# Patient Record
Sex: Female | Born: 1955 | Race: White | Hispanic: No | Smoking: Never smoker
Health system: Southern US, Community
[De-identification: ages and names within clinical notes are randomized; demographics above are authoritative.]

## PROBLEM LIST (undated history)

## (undated) DIAGNOSIS — F329 Major depressive disorder, single episode, unspecified: Secondary | ICD-10-CM

## (undated) DIAGNOSIS — D369 Benign neoplasm, unspecified site: Secondary | ICD-10-CM

## (undated) DIAGNOSIS — K219 Gastro-esophageal reflux disease without esophagitis: Secondary | ICD-10-CM

## (undated) DIAGNOSIS — K5790 Diverticulosis of intestine, part unspecified, without perforation or abscess without bleeding: Secondary | ICD-10-CM

## (undated) DIAGNOSIS — F32A Depression, unspecified: Secondary | ICD-10-CM

## (undated) DIAGNOSIS — M1990 Unspecified osteoarthritis, unspecified site: Secondary | ICD-10-CM

## (undated) DIAGNOSIS — G4730 Sleep apnea, unspecified: Secondary | ICD-10-CM

## (undated) DIAGNOSIS — F419 Anxiety disorder, unspecified: Secondary | ICD-10-CM

## (undated) DIAGNOSIS — E66813 Obesity, class 3: Secondary | ICD-10-CM

## (undated) DIAGNOSIS — T4145XA Adverse effect of unspecified anesthetic, initial encounter: Secondary | ICD-10-CM

## (undated) DIAGNOSIS — E079 Disorder of thyroid, unspecified: Secondary | ICD-10-CM

## (undated) DIAGNOSIS — M8580 Other specified disorders of bone density and structure, unspecified site: Secondary | ICD-10-CM

## (undated) DIAGNOSIS — K648 Other hemorrhoids: Secondary | ICD-10-CM

## (undated) HISTORY — DX: Other hemorrhoids: K64.8

## (undated) HISTORY — DX: Unspecified osteoarthritis, unspecified site: M19.90

## (undated) HISTORY — PX: JOINT REPLACEMENT: SHX530

## (undated) HISTORY — DX: Diverticulosis of intestine, part unspecified, without perforation or abscess without bleeding: K57.90

## (undated) HISTORY — DX: Depression, unspecified: F32.A

## (undated) HISTORY — PX: BREAST BIOPSY: SHX20

## (undated) HISTORY — DX: Major depressive disorder, single episode, unspecified: F32.9

## (undated) HISTORY — DX: Other specified disorders of bone density and structure, unspecified site: M85.80

## (undated) HISTORY — DX: Anxiety disorder, unspecified: F41.9

## (undated) HISTORY — DX: Disorder of thyroid, unspecified: E07.9

## (undated) HISTORY — DX: Morbid (severe) obesity due to excess calories: E66.01

## (undated) HISTORY — DX: Benign neoplasm, unspecified site: D36.9

## (undated) HISTORY — DX: Sleep apnea, unspecified: G47.30

## (undated) HISTORY — DX: Obesity, class 3: E66.813

---

## 1999-06-27 ENCOUNTER — Other Ambulatory Visit: Admission: RE | Admit: 1999-06-27 | Discharge: 1999-06-27 | Payer: Self-pay | Admitting: Obstetrics and Gynecology

## 2006-04-19 ENCOUNTER — Encounter (INDEPENDENT_AMBULATORY_CARE_PROVIDER_SITE_OTHER): Payer: Self-pay | Admitting: *Deleted

## 2006-04-19 ENCOUNTER — Inpatient Hospital Stay (HOSPITAL_COMMUNITY): Admission: EM | Admit: 2006-04-19 | Discharge: 2006-04-23 | Payer: Self-pay | Admitting: Emergency Medicine

## 2008-02-20 ENCOUNTER — Encounter: Admission: RE | Admit: 2008-02-20 | Discharge: 2008-02-20 | Payer: Self-pay | Admitting: Emergency Medicine

## 2008-06-11 ENCOUNTER — Emergency Department (HOSPITAL_BASED_OUTPATIENT_CLINIC_OR_DEPARTMENT_OTHER): Admission: EM | Admit: 2008-06-11 | Discharge: 2008-06-11 | Payer: Self-pay | Admitting: Emergency Medicine

## 2008-06-28 ENCOUNTER — Ambulatory Visit (HOSPITAL_COMMUNITY): Admission: RE | Admit: 2008-06-28 | Discharge: 2008-06-28 | Payer: Self-pay | Admitting: Family Medicine

## 2008-07-17 ENCOUNTER — Ambulatory Visit (HOSPITAL_BASED_OUTPATIENT_CLINIC_OR_DEPARTMENT_OTHER): Admission: RE | Admit: 2008-07-17 | Discharge: 2008-07-17 | Payer: Self-pay | Admitting: Orthopedic Surgery

## 2008-08-28 HISTORY — PX: TOTAL KNEE ARTHROPLASTY: SHX125

## 2008-09-25 ENCOUNTER — Encounter (INDEPENDENT_AMBULATORY_CARE_PROVIDER_SITE_OTHER): Payer: Self-pay | Admitting: Orthopedic Surgery

## 2008-09-25 ENCOUNTER — Inpatient Hospital Stay (HOSPITAL_COMMUNITY): Admission: RE | Admit: 2008-09-25 | Discharge: 2008-09-29 | Payer: Self-pay | Admitting: Orthopedic Surgery

## 2008-11-30 ENCOUNTER — Other Ambulatory Visit: Admission: RE | Admit: 2008-11-30 | Discharge: 2008-11-30 | Payer: Self-pay | Admitting: Family Medicine

## 2009-06-10 ENCOUNTER — Ambulatory Visit (HOSPITAL_COMMUNITY): Admission: RE | Admit: 2009-06-10 | Discharge: 2009-06-10 | Payer: Self-pay | Admitting: Orthopedic Surgery

## 2011-05-12 NOTE — Op Note (Signed)
NAME:  Joan Russo, Joan Russo NO.:  [id number]   MEDICAL RECORD NO.:  [id number]          PATIENT TYPE:  AMB   LOCATION:  [facility]                         FACILITY:  [facility]   PHYSICIAN:  Deidre Ala, M.D.    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   POSTOPERATIVE DIAGNOSES:  1. Significant degenerative medial lateral meniscus tears.  2. Degenerative joint disease grade 3 to 4 tricompartment.  3. Tight lateral retinaculum.  4. Large fibrous medial and lateral plicae.   PROCEDURE:  1. Left knee operative arthroscopy with partial medial and lateral      meniscectomies.  2. Abrasion ablation chondroplasties tricompartment.  3. Arthroscopic lateral retinacular release.  4. Medial and lateral plica excision.   SURGEON:  Doristine Section, M.D.   ASSISTANT:  Phineas Semen, PA-C.   ANESTHESIA:  General with LMA.   CULTURES:  None.   DRAINS:  None.   ESTIMATED BLOOD LOSS:  Minimal.   TOURNIQUET TIME:  49 minutes.   PATHOLOGIC FINDINGS AND HISTORY:  Joan Russo was sent for  consultation by Dr. Shaune Pollack for knee pain.  Longstanding history of  the knee intermittently giving way, pain increasingly worse, swelling in  the knee.  She had x-rays showing no obvious signs of degenerative  change in the knee joint.  She was injected with cortisone by Dr.  Althea Charon in my office.  She came back with severe knee pain.  MRI had  been done showing a medial meniscal posterior horn tear with meniscal  excision, severe chondromalacia patella and prepatellar bursitis.  The  patient did not have bone on bone.  She did have body mass index issues  with her height being 4 feet 11 and weight 240 pounds, so because she  was having mechanical symptoms we elected to proceed with diagnostic  and  operative arthroscopy.  At surgery, she had tricompartment DJD on a dime  sized area on the anterior medial femoral condyle, the medial joint line  femoral and tibial, the lateral joint line mostly tibial, the trochlea  grade 2 in most of the groove and about 50% of the posterior patella,  especially medially.  She had huge thick fibroadipose plicae.  She had a  very tight lateral retinaculum.  Some medial osteophyte formation.  She  had a degenerative medial meniscus tear with a portion of the posterior  horn flipped under.  This was debrided back to stable rim and posterior  meniscectomy carried out in the posterior one-third.  The lateral  meniscus did have degenerative changes from front to back that were  significant and these were debrided to a stable rim.  ACL was intact.   DESCRIPTION OF PROCEDURE:  With adequate anesthesia obtained using  endotracheal technique, 1 gram Ancef given IV prophylaxis, the patient  was placed in the supine position.  The left lower extremity was prepped  from the malleoli to the leg holder in the  standard fashion.  After  standard prepping and draping, Esmarch exsanguination was used.  The  tourniquet was let up to 400 mmHg.  Superior lateral and inflow portal  were made.  The knee was insufflated with normal saline with the  arthroscopic pump.  Medial lateral scope portals were then made and the  joint was thoroughly inspected.  I then shaved out the extremely large  thick fibroadipose plica back to the medial sidewall and lysed the  medial band.  I then exposed the medial meniscus and used basket and  shaver to saucerize it to a stable rim and removed most of the posterior  horn.  I then used a shaver and ablator on one to seal and smoothed the  tibiofemoral surfaces.  I then reversed portals, checked the lateral  meniscus, used basket, shaver and ablator to seal and shaved and ablated  the lateral compartment as above.  I then shaved out the  lateral plica.  I then shaved the lateral gutter.  I then smoothed the trochlea and the  posterior patella with the ablator and shaver.  I then observed tilt and  track and did an arthroscopic lateral retinacular release from vastus  lateralis to the joint line and further ablated and smoothed the  posterior patella.  The knee was then irrigated through the scope.  0.5%  Marcaine with morphine was injected in and about the joint.  The portals  were left open.  A bulky sterile compressive dressing was applied with  lateral foam pad for tamponade and Ezy-Wrap placed.  The patient then  having tolerated the procedure well was awakened, taken recovery room in  satisfactory condition to be discharged per outpatient routine.  Given  Percocet for pain and told to call the office for recheck tomorrow.           ______________________________  V. Charlesetta Shanks, M.D.     VEP/MEDQ  D:  07/17/2008  T:  07/17/2008  Job:  [id number]   cc:   Duncan Dull, M.D.  Fax: [phone]   Otho Darner, M.D.  Fax: [phone]

## 2011-05-12 NOTE — Op Note (Signed)
NAME:  Joan Russo, Joan Russo NO.:  [id number]   MEDICAL RECORD NO.:  [id number]          PATIENT TYPE:  INP   LOCATION:  1611                         FACILITY:  [facility]   PHYSICIAN:  Deidre Ala, M.D.    DATE OF BIRTH:  07/24/1956   DATE OF PROCEDURE:  09/25/2008  DATE OF DISCHARGE:                               OPERATIVE REPORT   PREOPERATIVE DIAGNOSIS:  End-stage degenerative joint disease, left  knee.   POSTOPERATIVE DIAGNOSIS:  End-stage degenerative joint disease, left  knee.   PROCEDURE:  Left total knee arthroplasty using cemented DePuy  components, LCS type with rotating platform with MBT stem.   SURGEON:  1. Charlesetta Shanks, M.D.   ASSISTANT:  Phineas Semen, P.A.   ANESTHESIA:  General with femoral nerve block with LMA.   CULTURES:  None.   DRAINS:  None.   BLOOD LOSS:  Less than 100 mL, replaced without.   TOURNIQUET TIME:  89 minutes.   PATHOLOGIC FINDINGS AND HISTORY:  Isa has high body mass index.  She  underwent a left knee arthroscopy July 17, 2008.  She had marked  changes.  She was debrided.  Postop she never did well.  She continued  to have pain and so she elected to proceed with total knee arthroplasty  and she became bone on bone.  In any case at surgery, she had end-stage  disease at this point. It should be noted that pre-knee scope, she was  not felt to be end stage.  In any case at total knee, we placed a medium  left femur with a #2 revision cemented tray MBT stem which measured 75 x  12, rotating platform size 10, a 32 mm all poly patella button and we  used tobramycin in the cement, gentamicin in the knee at the end of the  case.  I also had given her prophylaxis with Ancef but I did add  vancomycin at the end of the case.  She had an odd pannus in her knee  with no evidence of gross purulence.  She had had previous surgery, so  for precaution I added the vancomycin for possible greater coverage  perioperatively.  She did have  a frozen section which showed no  increasing polys but I felt it might be consistent with some sort of  biofilm low virulent organism even given those parameters and so I added  the vancomycin.  We had good fit and fill with full extension with  flexion to 95 degrees and good ligamentous stability with good patellar  tracking.   PROCEDURE:  With adequate anesthesia obtained, using LMA technique and  femoral nerve block, the patient was placed in the supine position.  The  left lower extremity was prepped from toes to the tourniquet in standard  fashion.  After standard prepping and draping, Esmarch examination was  used.  The tourniquet let up to 375 mmHg.  Median parapatellar skin  incision was followed by median parapatellar retinacular incision.  The  incision was deepened sharply with a knife and hemostasis obtained using  the Bovie electrocoagulator.  Dissection was carried down  to the  retinaculum which was opened and the patella was everted.  Fat pad  excised.  The menisci and both cruciates were removed.  With some  arduousness retractors were placed as she had large extremity obesity.  We did then place the intermedullary drill down the tibia, reamed up to  a 12, set the tibial cutting jig in place and made that cut.  We then  sized the femur to a medium, placed the intramedullary guide.  We set it  at 12.5.  It was a bit tight, so moved it up 2.5 mm anteriorly.  I made  the anterior-posterior cuts and fit the 10 in flexion.  We then placed a  4 degrees valgus distal femoral cutting jig in place, made that cut 5 mm  back, fit the 10 in full extension.  We then placed the finishing guide  on the femur, made those cuts.  We then exposed the tibia and reamed up  the proximal ream and the distal ream for the 12, then put the trial in  with a keel punch, put the 10-mm rotating platform, put on the femoral  component and articulated the knee through a range of motion.  We then   callipered the patella to a 22.  We cut it free hand down to about a 14,  placed the template for the three holes of the patella and then trialed  the patella.  All trial components were then removed while we checked  component sizing as they came on the field and thorough jet lavage of  the knee was carried out.  We then mixed cement two batches with 1.3  grams each of tobramycin.  We then cemented on the tibial component,  impacted it, removed excess cement.  We then cemented on the femoral  component, impacted it, removed excess cement with the rotating platform  put in place.  We then held the knee in full extension and removed  excess cement.  We then cemented on the patella component, impacted it  and removed excess cement and held it with a clamp until the cement had  cured.  We then irrigated.  We then let the cement cure, irrigated the  knee.  The tourniquet was let down.  Bleeding points were cauterized.  Hemovac drains were placed in the medial and lateral gutter and brought  out through the superior lateral portal.  The wound was then closed in  layers with #1 interrupted figure-of-eight Vicryls on the retinaculum  with a running locking oversew of #1 PDS, 0 and 2-0 Vicryl subcu and  skin staples.  Hemovac was hooked up to Autovac.  A bulky sterile  compressive dressing was applied and we then rubber shod the Hemovac  drain tubes after injecting gentamicin into the knee 20 mg in 40 mL of  saline and will hold that until 2 hours postop.  The dressing was  applied with knee immobilizer.  The patient having tolerated procedure  well, was awakened, taken to recovery room in satisfactory condition to  be admitted for routine postoperative care, analgesia and CPM.           ______________________________  V. Charlesetta Shanks, M.D.     VEP/MEDQ  D:  09/25/2008  T:  09/26/2008  Job:  [id number]   cc:   Duncan Dull, M.D.  Fax: [phone]   Otho Darner, M.D.  Fax: [phone]

## 2011-05-12 NOTE — Discharge Summary (Signed)
NAME:  Joan Russo, Joan Russo NO.:  [id number]   MEDICAL RECORD NO.:  [id number]          PATIENT TYPE:  INP   LOCATION:  1611                         FACILITY:  [facility]   PHYSICIAN:  Deidre Ala, M.D.    DATE OF BIRTH:  27-Mar-1956   DATE OF ADMISSION:  09/25/2008  DATE OF DISCHARGE:  09/29/2008                               DISCHARGE SUMMARY   FINAL DIAGNOSES:  1. End-stage degenerative joint disease, left knee.  2. Postoperative blood loss anemia.  3. Morbid obesity.   PROCEDURES:  September 25, 2008:  Left knee total arthroplasty.  Surgeon:  Dr. Renae Fickle.   This is a 55 year old Caucasian female followed by Dr. Renae Fickle.  She was  having a lot of left knee pain.  She underwent arthroscopy.  She failed  this and conservative medical management which did not help.  She  continued to have chronic left knee pain.  She was now ready to undergo  total knee arthroplasty for alleviation of symptoms.   HOSPITAL COURSE:  The patient was admitted to [facility] on  September 25, 2008.  At that time, she underwent a left total knee  arthroplasty.  The patient tolerated the procedure well; no  intraoperative complications occurred.  Postoperatively, the patient did  well.  She had a  dressing on and a drain on which was removed on the second postoperative  day.  She was working with Physical Therapy.  She had a CMP machine  going.  On the evening of the second postoperative day, she had got out  of bed by herself and had fallen, but she did not receive any  significant injuries.  She continued to do well after this without any  problems.  She did continue to work with Physical Therapy.  By the  fourth postoperative day, she was able to get out of bed by herself and  get up without assistance.  At this time, it was deemed she was ready to  be discharged.  She did have postop blood loss anemia at the time of  discharge.  Her hemoglobin was 8.1 and her hematocrit was 24.5 with  a  WBC of 6.4 and her platelets were 195,000.  Her incision was clean and  dry.  She had no Homans' sign, no calf pain.  Peripheral pulses were  intact, neuro was grossly intact.   At the time of discharge, the patient was to get Percocet 5/325 one or 2  p.o. q. 4-6 hours p.r.n. for pain, no refills.  She was to continue with  Lovenox 30 mg subcu q.12 hours for the next 10 days for deep vein  thrombus prophylaxis.  Then she was given Robaxin 750 mg 1 p.o. q.i.d.  p.r.n., no refills.  She was discharged home in satisfactory stable  condition on September 29, 2008.  She will follow up with Dr. Renae Fickle in 10  days.      Phineas Semen, P.A.    ______________________________  Seth Bake. Charlesetta Shanks, M.D.    CL/MEDQ  D:  09/29/2008  T:  09/29/2008  Job:  [id number]   cc:  Deidre Ala, M.D.  Fax: [phone]

## 2011-05-15 NOTE — H&P (Signed)
NAME:  Joan Russo, Joan Russo            ACCOUNT NO.:  [id number]   MEDICAL RECORD NO.:  [id number]          PATIENT TYPE:  EMS   LOCATION:  MAJO                         FACILITY:  [facility]   PHYSICIAN:  Melissa L. Ladona Ridgel, MD  DATE OF BIRTH:  1956-07-09   DATE OF ADMISSION:  04/19/2006  DATE OF DISCHARGE:                                HISTORY & PHYSICAL   CHIEF COMPLAINT:  Chest pain.   PRIMARY CARE PHYSICIAN:  Schuyler Amor, M.D.   HISTORY OF PRESENT ILLNESS:  The patient is a 55 year old white female old white female with  a past medical history for anxiety but no diabetes and no hypertension. The  patient states that last week she had pain between her shoulder blades which  she researched on the Internet and found that this may be a sign of coronary  artery disease in females. The pain sort of went away but she kept that in  mind and this week she has noticed that over the past couple of weeks she  has had increase shortness of breath with minimal activity. She states  yesterday she swept the kitchen with a broom and became very short of breath  to the point where her husband made a comment about it. The patient has  noted that she cannot do stairs very easily of shortness of breath but  denies any problems with waking up at night short of breath. The patient  states this morning she got up and was getting to go to work and getting her  grandchildren ready to go to school when she noticed that she had a stabbing  like pain in her left chest right under her left breast. The pain was worse  with deep inspiration, nothing made it better. She did take 4 regular adult  aspirins after looking on the Internet to see if this was consistent with a  myocardial infarction. She subsequently took the children to school and was  getting ready to go work and noticed that the pain just continued to  escalate and therefore she came to the emergency room for further  evaluation. She describes the pain as stabbing in  nature. It goes through  the lower chest into her back. Nothing makes it better, taking a deep breath  makes it worse. At present, it has resolved with minimal rest and the  aspirin but she still can notice the spot where it is.   REVIEW OF SYSTEMS:  She states that she is able to lay flat to sleep. She  has no orthopnea. She does stairs and becomes very short of breath and  recently has noted shortness of breath associated with fatigue. She has had  no weight loss or weight gain although she is obese. She does occasionally  get palpations when she is anxious. She denies any cough, fever, chills,  nausea, vomiting. She has had no dysuria, no abdominal pain and no lower  extremity edema. All other review of systems are negative.   PAST MEDICAL HISTORY:  Anxiety, denies hypertension or diabetes.   PAST SURGICAL HISTORY:  None.   SOCIAL HISTORY:  She denies tobacco  or ethanol or illicit drug use. She  works in Airline [employer].   FAMILY HISTORY:  Mom is living with diabetes. Dad is deceased at the age of  32 from cancer which is presumed to be lung but is unclear. She is married,  she has children and she currently is caring for 2 of her grandchildren at  home.   ALLERGIES:  No known drug allergies.   MEDICATIONS:  Xanax 1/2 of a 0.25 tablet p.r.n.   PHYSICAL EXAMINATION:  VITAL SIGNS:  Temperature 97.9, blood pressure is  142/80, pulse is 72, respirations 20, saturation 99%.  GENERAL:  This is an obese white female in no acute distress.  HEENT:  She is normocephalic, atraumatic. Pupils equal round and reactive to  light. Extraocular muscles are intact. Mucous membranes are moist.  NECK:  Supple. There is no JVD, no lymph nodes and no carotid bruits.  CHEST:  Clear to auscultation. There is no rhonchi, rales or wheezes.  CARDIOVASCULAR:  There is a regular rate and rhythm. Positive S1, S2, no S3,  S4. No murmurs, rubs or gallops.  ABDOMEN:  Obese, nontender, nondistended with positive bowel  sounds.  EXTREMITIES:  Show no cyanosis, clubbing or edema.  NEUROLOGIC:  Awake, alert, oriented. Cranial nerves II-XII are intact. Power  is 5/5. Deep tendon reflexes are 2+.   LABORATORY DATA:  Sodium is 142, potassium 3.9, chloride 109, CO2 is 41.8.  BUN is 12, creatinine 0.8, glucose is 91, her white count is 8.7. Hemoglobin  14, hematocrit 40.6 and platelets are 327. Her D-dimer is less than 22 which  is negative and point of care enzymes are negative so far.   EKG shows 72 beats per minute with no ST-T wave changes. Prior chest x-ray  shows cardiomegaly with no infiltrates and no congestive heart failure.   ASSESSMENT:  This is a 55 year old white female female with no significant past  medical history except for anxiety and obesity. She denies hypertension or  diabetes. She presents with left sided chest pain, escalating shortness of  breath with activities of daily living and pain between her shoulder bladder  last week.   1.  Cardiovascular.  Atypical chest pain with recent fatigue and dyspnea on      exertion with daily activities. Will check a 2-D echo, start her on      aspirin and Lopressor as well as Lovenox 1 mg/kg. Will obtain a      cardiology consult for possible stress testing.  2.  Pulmonary.  Dyspnea on exertion in the face of obesity with a negative D-      dimer. I do not feel compelled to do a chest CT at this time.  3.  Gastrointestinal. Will start her on a PPI.  4.  Genitourinary.  There are no complaints.  5.  Endocrine. Will check a fasting lipid panel and a hemoglobin A1c as well      as a TSH.  6.  A DVT prophylaxis will be with 1 mg/kg of Lovenox therapy to cover her      for ACS as well.      Melissa L. Ladona Ridgel, MD  Electronically Signed     MLT/MEDQ  D:  04/19/2006  T:  04/19/2006  Job:  [id number]   cc:   Schuyler Amor, M.D.  Fax: [phone]

## 2011-05-15 NOTE — Discharge Summary (Signed)
NAME:  Joan Russo, Joan Russo NO.:  [id number]   MEDICAL RECORD NO.:  [id number]          PATIENT TYPE:  INP   LOCATION:  4707                         FACILITY:  [facility]   PHYSICIAN:  Deirdre Peer. Polite, M.D. DATE OF BIRTH:  1956/01/15   DATE OF ADMISSION:  04/19/2006  DATE OF DISCHARGE:  04/23/2006                                 DISCHARGE SUMMARY   DISCHARGE DIAGNOSES:  1.  Chest pain.  Please note the patient underwent Cardiolite stress test      which showed mild reversible defect at the apex/anterior lateral wall,      ejection fraction 79%.  The patient ultimately underwent cardiac      catheterization which showed normal coronaries and normal left      ventricular function.  2.  Mild dyslipidemia.  3.  Elevated TSH at 5.9. recommend repeat testing in four to six weeks.   DISCHARGE MEDICATIONS:  The patient to resume p.r.n. Xanax.   HISTORY OF PRESENT ILLNESS:  A 55 year old female presented to the hospital  for evaluation of chest pain.  Because of the patient's complaints,  admission was deemed necessary for further evaluation and treatment.  Please  see dictated H&P for further details.   PAST MEDICAL HISTORY:  1.  Anxiety.  2.  The denies hypertension or diabetes.   PAST SURGICAL HISTORY:  None.   SOCIAL HISTORY:  Negative for tobacco.   FAMILY HISTORY:  Per admission H&P.   HOSPITAL COURSE:  The patient was admitted to telemetry floor bed for  evaluation and treatment of chest pain.  The patient was had serial cardiac  enzymes which were negative for ischemia.  The patient underwent Cardiolite  testing which showed mild reversible defect at the apex/anterior lateral  wall for EF of 79%.  Therefore, the patient underwent cardiac  catheterization.  The patient's cardiac catheterization was within normal  limits.  Therefore, the patient's chest pains were likely not cardiac, felt  similar to her anxiety disorder.  The patient did have a mildly elevated  TSH  which was recommended to be followed up for the six weeks.  The patient did  have mildly abnormal lipids with a total cholesterol of 194 and LDL of 122,  HDL 49.  Lifestyle modification was recommended especially  because the patient's has many risk factors.  Cholesterol medications were  started at this time.  Recommend further followup with primary MD in  approximately three to four months time.  The patient was discharged to home  in stable condition.      Deirdre Peer. Polite, M.D.  Electronically Signed     RDP/MEDQ  D:  06/03/2006  T:  06/03/2006  Job:  [id number]

## 2011-05-15 NOTE — Cardiovascular Report (Signed)
NAME:  Joan Russo, Joan Russo NO.:  [id number]   MEDICAL RECORD NO.:  [id number]          PATIENT TYPE:  INP   LOCATION:  4707                         FACILITY:  [facility]   PHYSICIAN:  Meade Maw, M.D.    DATE OF BIRTH:  Nov 16, 1956   DATE OF PROCEDURE:  04/23/2006  DATE OF DISCHARGE:  04/23/2006                              CARDIAC CATHETERIZATION   DATE OF PROCEDURE:  April 23, 2006.   PROCEDURE PERFORMED:  Left heart catheterization, coronary angiography,  single-plane ventriculogram.   PROCEDURE:  After obtaining written informed consent, the patient was  brought to the cardiac catheterization lab in a postabsorptive state.  Preop  sedation was achieved using Versed 2 mg IV.  The right groin was prepped and  draped in usual sterile fashion.  Local anesthesia was achieved using 1%  Xylocaine.  A 6-French hemostasis sheath was placed into the right femoral  artery using a modified Seldinger technique.  Selective coronary angiography  was performed using a JL 3.5 and a 6-French NTR catheter.  All catheter  exchanges were made over a guide wire.  There was an initial spasm of the  right coronary artery requiring intracoronary nitroglycerin.  This  demonstrated relief of the spasm.  There was no identifiable disease.  The  patient was then transferred to the holding area.  Hemostasis was achieved  using FemoStop device.  There was no immediate complications.   FINDINGS:  The aortic pressure is 110/40, LV pressure was 118/12.  EDP is  20.   SINGLE-PLANE VENTRICULOGRAM:  Single-plane ventriculogram revealed normal  wall motion, ejection fraction of 60-65%.  There was post PVC MR only.   CORONARY ANGIOGRAPHY:  Left main coronary artery bifurcates into the left  anterior descending and circumflex vessel.  There was no significant disease  noted in the left main coronary artery.   Left anterior descending:  Left anterior descending gives rise to a trivial  D1, large D2,   large D3, goes on to end as an apical branch.  There is no  disease noted in the left anterior descending or its branches.   Circumflex vessel:  Circumflex vessel is large caliber vessel, it gives rise  to trivial obtuse marginals and a large PL branch.  There is no disease  noted in the circumflex or its branches.   Right coronary artery:  Right coronary artery is dominant for the posterior  circulation, it gives rise to two RV marginals, PDA and a PL branch.  There  is no disease noted in the right coronary artery or its branches.   FINAL IMPRESSION:  Normal coronary angiography, normal single-plane  ventriculogram.   RECOMMENDATION:  Recommendation is to consider other etiologies for her  chest pain.      Meade Maw, M.D.  Electronically Signed     HP/MEDQ  D:  04/23/2006  T:  04/24/2006  Job:  [id number]   cc:   Schuyler Amor, M.D.  Fax: [phone]

## 2011-05-15 NOTE — Consult Note (Signed)
NAME:  Joan Russo, Joan Russo NO.:  [id number]   MEDICAL RECORD NO.:  [id number]          PATIENT TYPE:  OBV   LOCATION:  4707                         FACILITY:  [facility]   PHYSICIAN:  Meade Maw, M.D.    DATE OF BIRTH:  07/11/1956   DATE OF CONSULTATION:  04/20/2006  DATE OF DISCHARGE:                                   CONSULTATION   INDICATION FOR CONSULT:  Chest pain.   HISTORY:  Joan Russo is a 55 year old female.  She is the wife of another  patient of mine, Joan Russo.  She presents to the hospital with  complaints of left stabbing chest pain.  She initially noted two weeks prior  this presentation a pain in her back which waxed and waned.  The patient was  concerned that she had coronary artery disease.  Her husband also has  coronary artery disease.  She researched it on the Internet.  This added to  her concern that it may be cardiac but she did not seek attention.  She  continued over the next two weeks with increasing shortness of breath.  She  has had no orthopnea, no palpitations, no pedal edema.  On the day of  presentation, she got up, noted a stabbing pain in her left chest which  persisted.  There were no real aggravating or alleviating factors.  The  patient did take four aspirin.  There was no significant improvement.  She  subsequently took care of the children and then presented to the emergency  room for further evaluation and treatment.  There was no associated nausea,  vomiting, or diaphoresis.  Her coronary risk factors are significant for  post-menopausal status, morbid obesity.  There is no history of  hypertension, diabetes, or tobacco use.   PAST MEDICAL HISTORY:  Significant for:  1.  Anxiety disorder.  2.  Depression.   PAST SURGICAL HISTORY:  None.   SOCIAL HISTORY:  There is no history of tobacco or alcohol.  She is married.  She works in Airline [employer].  She currently is under increased stress.  She is  attempting to adopt her two  grandkids who are ages 42 and 41.   FAMILY HISTORY:  Mother has diabetes.  Father passed at the age of 39 from  cancer, unknown type.   MEDICATIONS PRIOR TO ARRIVAL:  Xanax 0.25 mg one-half tab p.r.n.   CURRENT MEDICATIONS:  Aspirin 325 mg daily, Lovenox per cardiology protocol,  Lopressor 12.5 mg p.o. q.12h., Protonix 40 mg daily, Xanax p.r.n.   REVIEW OF SYSTEMS:  As per the HPI and the patient notes that she has been  morbidly obese most of her life but has gained an additional 20 pounds since  caring for her grandchildren.  She does not have symptoms of excessive  daytime sleepiness, although she states that she suffers from chronic  insomnia.  She has had no change in her bowel or bladder habits.   PHYSICAL EXAMINATION:  VITAL SIGNS:  Systolic blood pressure ranging from  118 to 120, heart rate 75 to 80.  She is afebrile.  O2 sat 93% on room  air.  Telemetry is revealing sinus rhythm without ectopy.  HEENT:  It is difficult to evaluate secondary to body habitus.  She has good  carotid upstrokes.  There are no carotid bruits noted.  There is no neck  vein distention noted.  PULMONARY:  Reveals breath sounds which are equal and clear to auscultation.  No use of accessory muscles.  CARDIOVASCULAR:  Reveals regular rate and rhythm.  Normal S1 and S2.  PMI is  not palpable.  ABDOMEN:  Morbidly obese.  There is no unusual tenderness.  There are normal  bowel sounds.  EXTREMITIES:  Reveal distal pulses which are palpable.  There is no  peripheral edema noted.  NEUROLOGIC:  Nonfocal.   LABORATORY DATA:  Transthoracic echo was performed.  It was suboptimal  secondary to body habitus.  Overall, the patient was noted to have grossly  normal valvular morphology, grossly normal systolic function.  Her [facility]  markers were negative.  Her pH was 7.36.  Her bicarb was 24, pCO2 41.  White  count 8.4.  Hemoglobin 13.  Platelet count was 280.  INR was 1.  She had  normal electrolytes.   Creatinine 0.9.  Normal liver function tests.  Myoglobin was 63.  D-dimer was normal at less than 0.22.  Chest x-ray  revealed no active disease; did demonstrate cardiomegaly, most likely  related to technique in that the patient is morbidly obese.  Her ECG reveals  normal sinus rhythm.  There is light R-wave progression which is most likely  related to technique.  There are no ST changes.   IMPRESSION:  40.  A 55 year old female with atypical chest pain, significant concerns      regarding coronary artery disease.  Risk factors include morbid obesity      and age.  We will proceed with a stress Cardiolite for further      evaluation.  2.  Anxiety disorder.  The patient continues on Xanax.  3.  Depression.  May need to consider antidepressive therapy.  4.  Dyslipidemia.  We will obtain a fasting lipid profile for further      evaluation.      Meade Maw, M.D.  Electronically Signed     HP/MEDQ  D:  04/20/2006  T:  04/21/2006  Job:  [id number]   cc:   Schuyler Amor, M.D.  Fax: [phone]

## 2011-09-25 LAB — POCT HEMOGLOBIN-HEMACUE
Hemoglobin: 14.3
Operator id: 268271

## 2011-09-28 LAB — BASIC METABOLIC PANEL
BUN: 10
Calcium: 8.8
Chloride: 102
Creatinine, Ser: 1.15

## 2011-09-28 LAB — URINALYSIS, ROUTINE W REFLEX MICROSCOPIC
Bilirubin Urine: NEGATIVE
Glucose, UA: NEGATIVE
Hgb urine dipstick: NEGATIVE
Ketones, ur: NEGATIVE
Protein, ur: NEGATIVE
pH: 5.5

## 2011-09-28 LAB — CBC
Hemoglobin: 10.6 — ABNORMAL LOW
MCV: 88.9
Platelets: 263
RBC: 3.52 — ABNORMAL LOW
RDW: 14.6
WBC: 12.3 — ABNORMAL HIGH

## 2011-09-28 LAB — DIFFERENTIAL
Eosinophils Absolute: 0.2
Eosinophils Relative: 3
Lymphocytes Relative: 46
Neutrophils Relative %: 45

## 2011-09-28 LAB — COMPREHENSIVE METABOLIC PANEL
ALT: 25
AST: 22
Alkaline Phosphatase: 85
BUN: 12
CO2: 26
Creatinine, Ser: 0.76
GFR calc non Af Amer: >60
Glucose, Bld: 102 — ABNORMAL HIGH
Total Protein: 6.5

## 2011-09-28 LAB — PROTIME-INR: Prothrombin Time: 13

## 2011-09-28 LAB — TYPE AND SCREEN

## 2011-09-28 LAB — APTT: aPTT: 32

## 2011-09-28 LAB — URINE CULTURE

## 2011-09-28 LAB — ABO/RH: ABO/RH(D): A POS

## 2011-09-29 LAB — CBC
Hemoglobin: 9.2 — ABNORMAL LOW
MCHC: 33.2
MCHC: 33.4
MCHC: 33.5
Platelets: 186
RBC: 2.63 — ABNORMAL LOW
RBC: 2.73 — ABNORMAL LOW
RDW: 14.3
WBC: 5.5

## 2011-09-29 LAB — BASIC METABOLIC PANEL
BUN: 7
Glucose, Bld: 158 — ABNORMAL HIGH

## 2011-09-29 LAB — PREPARE RBC (CROSSMATCH)

## 2011-09-29 LAB — HEMOGLOBIN AND HEMATOCRIT, BLOOD: Hemoglobin: 8 — ABNORMAL LOW

## 2011-10-13 ENCOUNTER — Encounter: Payer: Self-pay | Admitting: Family Medicine

## 2011-10-13 ENCOUNTER — Ambulatory Visit (INDEPENDENT_AMBULATORY_CARE_PROVIDER_SITE_OTHER): Payer: PRIVATE HEALTH INSURANCE | Admitting: Family Medicine

## 2011-10-13 VITALS — BP 120/82 | HR 74 | Temp 98.4°F | Ht <= 58 in | Wt 272.2 lb

## 2011-10-13 DIAGNOSIS — F329 Major depressive disorder, single episode, unspecified: Secondary | ICD-10-CM

## 2011-10-13 DIAGNOSIS — F419 Anxiety disorder, unspecified: Secondary | ICD-10-CM

## 2011-10-13 DIAGNOSIS — F341 Dysthymic disorder: Secondary | ICD-10-CM

## 2011-10-13 DIAGNOSIS — Z6841 Body Mass Index (BMI) 40.0 and over, adult: Secondary | ICD-10-CM | POA: Insufficient documentation

## 2011-10-13 DIAGNOSIS — R5381 Other malaise: Secondary | ICD-10-CM

## 2011-10-13 DIAGNOSIS — Z23 Encounter for immunization: Secondary | ICD-10-CM

## 2011-10-13 DIAGNOSIS — M1990 Unspecified osteoarthritis, unspecified site: Secondary | ICD-10-CM

## 2011-10-13 DIAGNOSIS — E559 Vitamin D deficiency, unspecified: Secondary | ICD-10-CM

## 2011-10-13 DIAGNOSIS — F32A Depression, unspecified: Secondary | ICD-10-CM | POA: Insufficient documentation

## 2011-10-13 DIAGNOSIS — R5383 Other fatigue: Secondary | ICD-10-CM | POA: Insufficient documentation

## 2011-10-13 LAB — CBC WITH DIFFERENTIAL/PLATELET
Basophils Absolute: 0 10*3/uL (ref 0.0–0.1)
Basophils Relative: 0.5 % (ref 0.0–3.0)
Eosinophils Absolute: 0.3 10*3/uL (ref 0.0–0.7)
Lymphocytes Relative: 47.3 % — ABNORMAL HIGH (ref 12.0–46.0)
MCHC: 33.3 g/dL (ref 30.0–36.0)
Neutrophils Relative %: 43.1 % (ref 43.0–77.0)
RBC: 4.66 Mil/uL (ref 3.87–5.11)
RDW: 14.5 % (ref 11.5–14.6)

## 2011-10-13 LAB — T4, FREE: Free T4: 0.94 ng/dL (ref 0.60–1.60)

## 2011-10-13 LAB — BASIC METABOLIC PANEL
Chloride: 106 mEq/L (ref 96–112)
Potassium: 4.8 mEq/L (ref 3.5–5.1)
Sodium: 143 mEq/L (ref 135–145)

## 2011-10-13 LAB — TSH: TSH: 2.94 u[IU]/mL (ref 0.35–5.50)

## 2011-10-13 LAB — VITAMIN B12: Vitamin B-12: 561 pg/mL (ref 211–911)

## 2011-10-13 MED ORDER — DULOXETINE HCL 60 MG PO CPEP: 60.0000 mg | ORAL_CAPSULE | Freq: Every day | ORAL | Status: DC

## 2011-10-13 MED ORDER — DICLOFENAC SODIUM 75 MG PO TBEC: 75.0000 mg | DELAYED_RELEASE_TABLET | Freq: Two times a day (BID) | ORAL | Status: DC

## 2011-10-13 MED ORDER — CYCLOBENZAPRINE HCL 5 MG PO TABS: 5.0000 mg | ORAL_TABLET | Freq: Three times a day (TID) | ORAL | Status: DC | PRN

## 2011-10-13 NOTE — Progress Notes (Signed)
Subjective:    Patient ID: Joan Russo, female    DOB: [birth year], 55 y.o.   MRN: [id number]  HPI  55 yo here to establish care.  Morbid obesity- overweight her entire life. Getting progressively worse with progressive arthritis. She is now essentially housebound. Has tried every diet she can think of- weight watchers, nutrisytem, and every fad diet friends told her about.  Ready to do something about it.  Extreme fatigue- she thinks it is due to her weight but not sure.  Tired all the time, regardless of how much sleep she gets. No CP or SOB. No blurred vision.  No rectal bleeding.  Anxiety and depression- chronic issue.  Currently taking Cymbalta 60 mg daily (rather new medication for her) and as needed Alprazolam.  Constantly tearful and sad about her weight.  OA- s/p left TKR in 2009.  She feels it is time to replace the right side but cannot afford it until next year. Constant pain in lower back and bilateral knees. Denies radiculopathy.  Patient Active Problem List  Diagnoses  . Obesity, Class III, BMI 40-49.9 (morbid obesity)  . Anxiety and depression  . Vitamin D deficiency  . Fatigue   Past Medical History  Diagnosis Date  . Obesity, Class III, BMI 40-49.9 (morbid obesity)   . Anxiety and depression   . Vitamin D deficiency   . Urinary incontinence   . OA (osteoarthritis)    Past Surgical History  Procedure Date  . 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 Smoking status: Never Smoker   . Smokeless tobacco: Not on file  . Alcohol Use: Not on file   Family History  Problem Relation Age of Onset  . Cancer Mother 63    breast  . Cancer Daughter 81    breast   No Known Allergies  Current outpatient prescriptions:alendronate (FOSAMAX) 70 MG tablet, Take 70 mg by mouth every 7 (seven) days. Take with a full glass of water on an empty stomach. , Disp: , Rfl: ;  ALPRAZolam (XANAX) 0.5 MG tablet, Take 0.5 mg by mouth at  bedtime as needed.  , Disp: , Rfl: ;  diclofenac (VOLTAREN) 75 MG EC tablet, Take 1 tablet (75 mg total) by mouth 2 (two) times daily., Disp: 30 tablet, Rfl: 1 DULoxetine (CYMBALTA) 60 MG capsule, Take 1 capsule (60 mg total) by mouth daily., Disp: 30 capsule, Rfl: 1;  Ergocalciferol (VITAMIN D2 PO), Take 1 tablet by mouth once a week.  , Disp: , Rfl: ;  cyclobenzaprine (FLEXERIL) 5 MG tablet, Take 1 tablet (5 mg total) by mouth every 8 (eight) hours as needed for muscle spasms., Disp: 30 tablet, Rfl: 1   The PMH, PSH, Social History, Family History, Medications, and allergies have been reviewed in [facility], and have been updated if relevant.   Review of Systems See HPI Patient reports no  vision/ hearing changes,anorexia,  fever ,adenopathy, persistant / recurrent hoarseness, swallowing issues, chest pain, edema,persistant / recurrent cough, hemoptysis, dyspnea(rest, exertional, paroxysmal nocturnal), gastrointestinal  bleeding (melena, rectal bleeding), abdominal pain, excessive heart burn, GU symptoms(dysuria, hematuria, pyuria, voiding/incontinence  Issues) syncope,  severe memory loss, concerning skin lesions,  abnormal bruising/bleeding, major joint swelling, breast masses or abnormal vaginal bleeding.       Objective:   Physical Exam BP 120/82  Pulse 74  Temp(Src) 98.4 F (36.9 C) (Oral)  Ht 4\' 10"  (1.473 m)  Wt 272 lb 4 oz (123.492  kg)  BMI 56.90 kg/m2  General:  Morbidly obese, alert,appropriate and cooperative throughout examination Head:  normocephalic and atraumatic.   Eyes:  vision grossly intact, pupils equal, pupils round, and pupils reactive to light.   Ears:  R ear normal and L ear normal.   Nose:  no external deformity.   Mouth:  good dentition.   Neck:  No deformities, masses, or tenderness noted. Lungs:  Normal respiratory effort, chest expands symmetrically. Lungs are clear to auscultation, no crackles or wheezes. Heart:  Normal rate and regular rhythm. S1 and S2 normal  without gallop, murmur, click, rub or other extra sounds. Abdomen:  Bowel sounds positive,abdomen soft and non-tender without masses, organomegaly or hernias noted. Msk:  No deformity or scoliosis noted of thoracic or lumbar spine.   Extremities:  No clubbing, cyanosis, edema, or deformity noted with normal full range of motion of all joints, SLR neg bilaterally, pos lumbar paraspinous spasm.   Neurologic:  alert & oriented X3  Skin:  Intact without suspicious lesions or rashes Cervical Nodes:  No lymphadenopathy noted Psych:  Cognition and judgment appear intact. Alert and cooperative with normal attention span and concentration. No apparent delusions, illusions, hallucinations     Assessment & Plan:   1. Obesity, Class III, BMI 40-49.9 (morbid obesity)  Deteriorated.  After discussing different options, pt would like to find out more about gastric surgery such as lap band. Will place referral.  Ambulatory referral to General Surgery  2. Anxiety and depression   Unchanged.  Continue current meds and reassess next month at physical exam. The patient indicates understanding of these issues and agrees with the plan.  DULoxetine (CYMBALTA) 60 MG capsule  3. Vitamin D deficiency   Vitamin D (25 hydroxy)  4. Fatigue   Deteriorated.  Likely multifactorial and significantly impacted by her weight.  Will check labs today to rule out other contributing factors.   TSH, T4, free, CBC w/Diff, Basic Metabolic Panel (BMET), B12  5. OA (osteoarthritis)  Deteriorated.  She wanted to defer ortho at this time. diclofenac (VOLTAREN) 75 MG EC tablet

## 2011-10-13 NOTE — Patient Instructions (Signed)
Nice to meet you. Please stop by to see Joan Russo on your way out to set up your referral.

## 2011-10-14 ENCOUNTER — Encounter: Payer: Self-pay | Admitting: *Deleted

## 2011-11-16 ENCOUNTER — Other Ambulatory Visit: Payer: Self-pay | Admitting: Family Medicine

## 2011-11-17 NOTE — Telephone Encounter (Signed)
Rx sent in electronically by Dr. Dayton Martes.

## 2011-12-01 ENCOUNTER — Ambulatory Visit (INDEPENDENT_AMBULATORY_CARE_PROVIDER_SITE_OTHER): Payer: PRIVATE HEALTH INSURANCE | Admitting: Family Medicine

## 2011-12-01 ENCOUNTER — Encounter: Payer: Self-pay | Admitting: Family Medicine

## 2011-12-01 VITALS — BP 122/82 | HR 97 | Temp 98.5°F | Ht <= 58 in | Wt 267.8 lb

## 2011-12-01 DIAGNOSIS — R5383 Other fatigue: Secondary | ICD-10-CM

## 2011-12-01 DIAGNOSIS — F419 Anxiety disorder, unspecified: Secondary | ICD-10-CM

## 2011-12-01 DIAGNOSIS — F32A Depression, unspecified: Secondary | ICD-10-CM

## 2011-12-01 DIAGNOSIS — M2550 Pain in unspecified joint: Secondary | ICD-10-CM | POA: Insufficient documentation

## 2011-12-01 DIAGNOSIS — Z136 Encounter for screening for cardiovascular disorders: Secondary | ICD-10-CM

## 2011-12-01 DIAGNOSIS — F341 Dysthymic disorder: Secondary | ICD-10-CM

## 2011-12-01 DIAGNOSIS — Z1211 Encounter for screening for malignant neoplasm of colon: Secondary | ICD-10-CM | POA: Insufficient documentation

## 2011-12-01 DIAGNOSIS — Z0000 Encounter for general adult medical examination without abnormal findings: Secondary | ICD-10-CM

## 2011-12-01 LAB — LIPID PANEL
Cholesterol: 231 mg/dL — ABNORMAL HIGH (ref 0–200)
HDL: 60.4 mg/dL (ref >39.00)
VLDL: 22.4 mg/dL (ref 0.0–40.0)

## 2011-12-01 MED ORDER — ALENDRONATE SODIUM 70 MG PO TABS: 70.0000 mg | ORAL_TABLET | ORAL | Status: DC

## 2011-12-01 MED ORDER — ALPRAZOLAM 0.5 MG PO TABS: 0.5000 mg | ORAL_TABLET | Freq: Every evening | ORAL | Status: DC | PRN

## 2011-12-01 MED ORDER — BUSPIRONE HCL 15 MG PO TABS: 7.5000 mg | ORAL_TABLET | Freq: Two times a day (BID) | ORAL | Status: DC

## 2011-12-01 MED ORDER — CYCLOBENZAPRINE HCL 5 MG PO TABS: ORAL_TABLET | ORAL | Status: DC

## 2011-12-01 NOTE — Patient Instructions (Signed)
Great to see you. We are adding Buspar 7.5 mg twice daily to your Cymbalta 60 mg daily. Please call me in 3-4 weeks with an update of how you are feeling. Please stop by to see Shirlee Limerick on your way out to set up your colonoscopy. Have a wonderful holiday.

## 2011-12-01 NOTE — Progress Notes (Signed)
Subjective:    Patient ID: Joan Russo, female    DOB: 10-29-1956, 55 y.o.   MRN: [id number]  HPI  55 yo here for CPX.  Morbid obesity- referred for lap band consultation last month.  She went to consultation but cannot afford her cost of her surgery. This has been worsening her anxiety and depression.  Anxiety and depression- chronic issue.  Currently taking Cymbalta 60 mg daily and as needed Alprazolam.  Constantly tearful and sad about her weight. Also anxious. No SI or HI but feels the Cymbalta isn't enough although it is helping with her joint pain.  OA- s/p left TKR in 2009.  She feels it is time to replace the right side but cannot afford it until next year. Constant pain in lower back and bilateral knees. Denies radiculopathy. Flexeril helps a little. Thinks she has OA in her fingers too.  Very swollen and painful DIP and PIP in mornings.  UTD on all prevention except has never had a colonoscopy. Patient Active Problem List  Diagnoses  . Obesity, Class III, BMI 40-49.9 (morbid obesity)  . Anxiety and depression  . Vitamin D deficiency  . Fatigue  . Routine general medical examination at a health care facility   Past Medical History  Diagnosis Date  . Obesity, Class III, BMI 40-49.9 (morbid obesity)   . Anxiety and depression   . Vitamin D deficiency   . Urinary incontinence   . OA (osteoarthritis)    Past Surgical History  Procedure Date  . 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 Smoking status: Never Smoker   . Smokeless tobacco: Not on file  . Alcohol Use: Not on file   Family History  Problem Relation Age of Onset  . Cancer Mother 20    breast  . Cancer Daughter 26    breast   No Known Allergies  Current outpatient prescriptions:alendronate (FOSAMAX) 70 MG tablet, Take 70 mg by mouth every 7 (seven) days. Take with a full glass of water on an empty stomach. , Disp: , Rfl: ;  ALPRAZolam (XANAX) 0.5 MG tablet,  Take 0.5 mg by mouth at bedtime as needed.  , Disp: , Rfl: ;  cyclobenzaprine (FLEXERIL) 5 MG tablet, TAKE 1 TABLET BY MOUTH EVERY 8 HOURS AS NEEDED FOR MUSCLE SPASMS, Disp: 30 tablet, Rfl: 1 diclofenac (VOLTAREN) 75 MG EC tablet, Take 1 tablet (75 mg total) by mouth 2 (two) times daily., Disp: 30 tablet, Rfl: 1;  DULoxetine (CYMBALTA) 60 MG capsule, Take 1 capsule (60 mg total) by mouth daily., Disp: 30 capsule, Rfl: 1;  Ergocalciferol (VITAMIN D2 PO), Take 1 tablet by mouth once a week.  , Disp: , Rfl:    The PMH, PSH, Social History, Family History, Medications, and allergies have been reviewed in [facility], and have been updated if relevant.   Review of Systems See HPI Patient reports no  vision/ hearing changes,anorexia,  fever ,adenopathy, persistant / recurrent hoarseness, swallowing issues, chest pain, edema,persistant / recurrent cough, hemoptysis, dyspnea(rest, exertional, paroxysmal nocturnal), gastrointestinal  bleeding (melena, rectal bleeding), abdominal pain, excessive heart burn, GU symptoms(dysuria, hematuria, pyuria, voiding/incontinence  Issues) syncope,  severe memory loss, concerning skin lesions,  abnormal bruising/bleeding, major joint swelling, breast masses or abnormal vaginal bleeding.       Objective:   Physical Exam BP 122/82  Pulse 97  Temp(Src) 98.5 F (36.9 C) (Oral)  Ht 4\' 10"  (1.473 m)  Wt 267 lb  12 oz (121.451 kg)  BMI 55.96 kg/m2 Wt Readings from Last 3 Encounters:  12/01/11 267 lb 12 oz (121.451 kg)  10/13/11 272 lb 4 oz (123.492 kg)     General:  Morbidly obese, alert,appropriate and cooperative throughout examination Head:  normocephalic and atraumatic.   Eyes:  vision grossly intact, pupils equal, pupils round, and pupils reactive to light.   Ears:  R ear normal and L ear normal.   Nose:  no external deformity.   Mouth:  good dentition.   Neck:  No deformities, masses, or tenderness noted. Lungs:  Normal respiratory effort, chest expands  symmetrically. Lungs are clear to auscultation, no crackles or wheezes. Heart:  Normal rate and regular rhythm. S1 and S2 normal without gallop, murmur, click, rub or other extra sounds. Abdomen:  Bowel sounds positive,abdomen soft and non-tender without masses, organomegaly or hernias noted. Msk:  No deformity or scoliosis noted of thoracic or lumbar spine.   Extremities:  No clubbing, cyanosis, edema, or deformity noted with normal full range of motion of all joints, SLR neg bilaterally, pos lumbar paraspinous spasm.   Neurologic:  alert & oriented X3  Skin:  Intact without suspicious lesions or rashes Cervical Nodes:  No lymphadenopathy noted Psych:  Cognition and judgment appear intact. Alert and cooperative with normal attention span and concentration. No apparent delusions, illusions, hallucinations     Assessment & Plan:     1. Routine general medical examination at a health care facility   Reviewed preventive care protocols, scheduled due services, and updated immunizations Discussed nutrition, exercise, diet, and healthy lifestyle.     GI referral for colonoscopy Lipid panel  2. Anxiety and depression   Deteriorated. Will add Buspar to current meds. Pt to call in 3-4 weeks with update of symptoms.  DULoxetine (CYMBALTA) 60 MG capsule  3. OA (osteoarthritis)  Deteriorated.  She wanted to defer ortho at this time. Will check RF and sed rate as well to rule out rheum. diclofenac (VOLTAREN) 75 MG EC tablet RF, SED rate

## 2011-12-18 ENCOUNTER — Telehealth: Payer: Self-pay | Admitting: Family Medicine

## 2011-12-18 NOTE — Telephone Encounter (Signed)
Left message on machine at home for patient to return call. 

## 2011-12-18 NOTE — Telephone Encounter (Signed)
Pt called with cough and congestion.  Please call patient back at [phone]

## 2011-12-21 ENCOUNTER — Ambulatory Visit (INDEPENDENT_AMBULATORY_CARE_PROVIDER_SITE_OTHER): Payer: PRIVATE HEALTH INSURANCE | Admitting: Family Medicine

## 2011-12-21 ENCOUNTER — Encounter: Payer: Self-pay | Admitting: Family Medicine

## 2011-12-21 VITALS — BP 130/80 | HR 79 | Temp 97.5°F | Wt 271.5 lb

## 2011-12-21 DIAGNOSIS — J069 Acute upper respiratory infection, unspecified: Secondary | ICD-10-CM | POA: Insufficient documentation

## 2011-12-21 MED ORDER — HYDROCOD POLST-CHLORPHEN POLST 10-8 MG/5ML PO LQCR: 5.0000 mL | Freq: Two times a day (BID) | ORAL | Status: DC | PRN

## 2011-12-21 NOTE — Patient Instructions (Signed)
Great to see you.  Merry Christmas. This is likely a virus.  Drink lots of fluids.  Treat sympotmatically with Mucinex, nasal saline irrigation, and Tylenol/Ibuprofen. Cough suppressant at night. Call if not improving as expected in 5-7 days.

## 2011-12-21 NOTE — Telephone Encounter (Signed)
Needs to be evaluated.

## 2011-12-21 NOTE — Telephone Encounter (Signed)
Spoke with patient and she stated that she has had a cold for about one month now, she thought it was gone but now she has a bad cough, congestion, and some wheezing.  She has used Mucinex DM with little to no relief.  No fever, no SOB.  Please advise.  Uses [facility].

## 2011-12-21 NOTE — Telephone Encounter (Signed)
Patient coming in to see Dr. Dayton Martes today at 11:45.

## 2011-12-21 NOTE — Progress Notes (Signed)
SUBJECTIVE:  Joan Russo is a 55 y.o. female who complains of coryza, congestion, sore throat and dry cough for 20 days. She denies a history of anorexia, chest pain, dizziness, fatigue, myalgias and shortness of breath and denies a history of asthma. Patient denies smoke cigarettes.   Patient Active Problem List  Diagnoses  . Obesity, Class III, BMI 40-49.9 (morbid obesity)  . Anxiety and depression  . Vitamin D deficiency  . Fatigue  . Routine general medical examination at a health care facility  . Polyarthralgia  . Screening for colon cancer  . URI (upper respiratory infection)   Past Medical History  Diagnosis Date  . Obesity, Class III, BMI 40-49.9 (morbid obesity)   . Anxiety and depression   . Vitamin D deficiency   . Urinary incontinence   . OA (osteoarthritis)    Past Surgical History  Procedure Date  . 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 Smoking status: Never Smoker   . Smokeless tobacco: Not on file  . Alcohol Use: Not on file   Family History  Problem Relation Age of Onset  . Cancer Mother 55    breast  . Cancer Daughter 48    breast   No Known Allergies Current Outpatient Prescriptions on File Prior to Visit  Medication Sig Dispense Refill  . alendronate (FOSAMAX) 70 MG tablet Take 1 tablet (70 mg total) by mouth every 7 (seven) days. Take with a full glass of water on an empty stomach.  4 tablet  6  . ALPRAZolam (XANAX) 0.5 MG tablet Take 1 tablet (0.5 mg total) by mouth at bedtime as needed.  30 tablet  1  . busPIRone (BUSPAR) 15 MG tablet Take 0.5 tablets (7.5 mg total) by mouth 2 (two) times daily.  60 tablet  2  . cyclobenzaprine (FLEXERIL) 5 MG tablet TAKE 1 TABLET BY MOUTH EVERY 8 HOURS AS NEEDED FOR MUSCLE SPASMS  90 tablet  1  . diclofenac (VOLTAREN) 75 MG EC tablet Take 1 tablet (75 mg total) by mouth 2 (two) times daily.  30 tablet  1  . DULoxetine (CYMBALTA) 60 MG capsule Take 1 capsule (60 mg  total) by mouth daily.  30 capsule  1  . Ergocalciferol (VITAMIN D2 PO) Take 1 tablet by mouth once a week.         The PMH, PSH, Social History, Family History, Medications, and allergies have been reviewed in [facility], and have been updated if relevant.  OBJECTIVE: BP 130/80  Pulse 79  Temp(Src) 97.5 F (36.4 C) (Oral)  Wt 271 lb 8 oz (123.152 kg)  SpO2 98%  She appears well, vital signs are as noted. Ears normal.  Throat and pharynx normal.  Neck supple. No adenopathy in the neck. Nose is congested. Sinuses non tender. The chest is clear, without wheezes or rales.  ASSESSMENT:  viral upper respiratory illness  PLAN: Symptomatic therapy suggested: push fluids, rest and return office visit prn if symptoms persist or worsen. Lack of antibiotic effectiveness discussed with her. Call or return to clinic prn if these symptoms worsen or fail to improve as anticipated.

## 2011-12-23 ENCOUNTER — Encounter: Payer: Self-pay | Admitting: Internal Medicine

## 2011-12-23 ENCOUNTER — Telehealth: Payer: Self-pay | Admitting: *Deleted

## 2011-12-23 NOTE — Telephone Encounter (Signed)
Patient called to let Dr. Dayton Martes know that her colonoscopy is scheduled for 01/26/2011 at [facility].

## 2011-12-24 ENCOUNTER — Other Ambulatory Visit: Payer: Self-pay | Admitting: *Deleted

## 2011-12-25 MED ORDER — HYDROCOD POLST-CHLORPHEN POLST 10-8 MG/5ML PO LQCR: 5.0000 mL | Freq: Two times a day (BID) | ORAL | Status: DC | PRN

## 2011-12-25 NOTE — Telephone Encounter (Signed)
[facility].

## 2012-01-15 ENCOUNTER — Ambulatory Visit (AMBULATORY_SURGERY_CENTER): Payer: PRIVATE HEALTH INSURANCE | Admitting: *Deleted

## 2012-01-15 VITALS — Ht <= 58 in | Wt 267.1 lb

## 2012-01-15 DIAGNOSIS — Z1211 Encounter for screening for malignant neoplasm of colon: Secondary | ICD-10-CM

## 2012-01-15 MED ORDER — PEG-KCL-NACL-NASULF-NA ASC-C 100 G PO SOLR: 1.0000 | Freq: Once | ORAL | Status: DC

## 2012-01-15 NOTE — Progress Notes (Signed)
Pt is on Cymbalta, Flexeril, Xanax, and Buspar.  Propofol sedation explained and offered but pt refuses.  States, "I have only been on these medications a few weeks.  I'll be ok."

## 2012-01-26 ENCOUNTER — Other Ambulatory Visit: Payer: Self-pay | Admitting: *Deleted

## 2012-01-26 DIAGNOSIS — M1990 Unspecified osteoarthritis, unspecified site: Secondary | ICD-10-CM

## 2012-01-26 MED ORDER — DICLOFENAC SODIUM 75 MG PO TBEC: 75.0000 mg | DELAYED_RELEASE_TABLET | Freq: Two times a day (BID) | ORAL | Status: DC

## 2012-01-27 ENCOUNTER — Other Ambulatory Visit: Payer: Self-pay | Admitting: *Deleted

## 2012-01-27 ENCOUNTER — Encounter: Payer: Self-pay | Admitting: Internal Medicine

## 2012-01-27 ENCOUNTER — Ambulatory Visit (AMBULATORY_SURGERY_CENTER): Payer: PRIVATE HEALTH INSURANCE | Admitting: Internal Medicine

## 2012-01-27 VITALS — BP 127/72 | HR 77 | Temp 96.6°F | Resp 20 | Ht <= 58 in | Wt 267.0 lb

## 2012-01-27 DIAGNOSIS — D129 Benign neoplasm of anus and anal canal: Secondary | ICD-10-CM

## 2012-01-27 DIAGNOSIS — F32A Depression, unspecified: Secondary | ICD-10-CM

## 2012-01-27 DIAGNOSIS — Z1211 Encounter for screening for malignant neoplasm of colon: Secondary | ICD-10-CM

## 2012-01-27 DIAGNOSIS — F329 Major depressive disorder, single episode, unspecified: Secondary | ICD-10-CM

## 2012-01-27 DIAGNOSIS — F419 Anxiety disorder, unspecified: Secondary | ICD-10-CM

## 2012-01-27 DIAGNOSIS — D126 Benign neoplasm of colon, unspecified: Secondary | ICD-10-CM

## 2012-01-27 DIAGNOSIS — D128 Benign neoplasm of rectum: Secondary | ICD-10-CM

## 2012-01-27 MED ORDER — DULOXETINE HCL 60 MG PO CPEP: 60.0000 mg | ORAL_CAPSULE | Freq: Every day | ORAL | Status: DC

## 2012-01-27 MED ORDER — SODIUM CHLORIDE 0.9 % IV SOLN: 500.0000 mL | INTRAVENOUS | Status: DC

## 2012-01-27 NOTE — Op Note (Signed)
[facility] [address] [place], [place]  [address]  COLONOSCOPY PROCEDURE REPORT  PATIENT:  Joan, Russo  MR#:  [id number] BIRTHDATE:  1956/10/31, 55 yrs. old  GENDER:  female ENDOSCOPIST:  Carie Caddy. Shatera Rennert, MD REF. BY:  Ruthe Mannan, M.D. PROCEDURE DATE:  01/27/2012 PROCEDURE:  Colonoscopy with snare polypectomy, Colon with cold biopsy polypectomy ASA CLASS:  Class II INDICATIONS:  Routine Risk Screening, 1st colonoscopy MEDICATIONS:   These medications were titrated to patient response per physician's verbal order, Benadryl 25 mg IV, Versed 5 mg IV, Fentanyl 50 mcg IV  DESCRIPTION OF PROCEDURE:   After the risks benefits and alternatives of the procedure were thoroughly explained, informed consent was obtained.  Digital rectal exam was performed and revealed no rectal masses.   The LB [id number] endoscope was introduced through the anus and advanced to the cecum, which was identified by both the appendix and ileocecal valve, without limitations.  The quality of the prep was good, using MoviPrep. The instrument was then slowly withdrawn as the colon was fully examined. <<PROCEDUREIMAGES>>  FINDINGS:  Three sessile polyps were found in the ascending colon. The polyps measured 3 to 7 mm and two were snared without cautery. Retrieval was successful.  The other small polyp was removed with cold forcep polypectomy.  Two sessile polyps measuring 3 - 4 mm were found in the recto-sigmoid colon (the 2nd on retroflexion near the dentate line). The polyps were removed using cold biopsy forceps.  Mild diverticulosis was found ascending colon to sigmoid colon.  Internal Hemorrhoids were found.   Retroflexed views in the rectum revealed no other findings other than those already described.   The scope was then withdrawn from the cecum and the procedure completed.  COMPLICATIONS:  None ENDOSCOPIC IMPRESSION: 1) Three polyps in the ascending colon.  Removed and sent  to pathology. 2) Two polyps in the recto-sigmoid colon. Removed and sent to pathology. 3) Mild diverticulosis ascending colon to sigmoid colon 4) Internal hemorrhoids  RECOMMENDATIONS: 1) Hold aspirin, aspirin products, and anti-inflammatory medication for 1 week. 2) Await pathology results 3) High fiber diet. 4) If the polyps removed today are proven to be adenomatous (pre-cancerous) polyps, you will need a colonoscopy in 3 years. Otherwise you should continue to follow colorectal cancer screening guidelines for "routine risk" patients with a colonoscopy in 10 years. 5) You will receive a letter within 1-2 weeks with the results of your biopsy as well as final recommendations. Please call my office if you have not received a letter after 3 weeks.  Carie Caddy. Rhea Belton, MD  CC:  Ruthe Mannan MD The Patient  n. eSIGNEDCarie Caddy. Akaysha Cobern at 01/27/2012 11:00 AM  [name], [phone]

## 2012-01-27 NOTE — Progress Notes (Signed)
Patient did not experience any of the following events: a burn prior to discharge; a fall within the facility; wrong site/side/patient/procedure/implant event; or a hospital transfer or hospital admission upon discharge from the facility. (G8907) Patient did not have preoperative order for IV antibiotic SSI prophylaxis. (G8918)  

## 2012-01-27 NOTE — Patient Instructions (Addendum)
Please refer to your blue and neon green sheets for instructions regarding diet and activity for the rest of today.  You may resume your medications as you would normally take them.   HOLD ALL ASPIRIN AND ASPIRIN CONTAINING PRODUCTS FOR 2 WEEKS (02/10/2012)  Colon Polyps A polyp is extra tissue that grows inside your body. Colon polyps grow in the large intestine. The large intestine, also called the colon, is part of your digestive system. It is a long, hollow tube at the end of your digestive tract where your body makes and stores stool. Most polyps are not dangerous. They are benign. This means they are not cancerous. But over time, some types of polyps can turn into cancer. Polyps that are smaller than a pea are usually not harmful. But larger polyps could someday become or may already be cancerous. To be safe, doctors remove all polyps and test them.  WHO GETS POLYPS? Anyone can get polyps, but certain people are more likely than others. You may have a greater chance of getting polyps if:  You are over 50.   You have had polyps before.   Someone in your family has had polyps.   Someone in your family has had cancer of the large intestine.   Find out if someone in your family has had polyps. You may also be more likely to get polyps if you:   Eat a lot of fatty foods.   Smoke.   Drink alcohol.   Do not exercise.   Eat too much.  SYMPTOMS  Most small polyps do not cause symptoms. People often do not know they have one until their caregiver finds it during a regular checkup or while testing them for something else. Some people do have symptoms like these:  Bleeding from the anus. You might notice blood on your underwear or on toilet paper after you have had a bowel movement.   Constipation or diarrhea that lasts more than a week.   Blood in the stool. Blood can make stool look black or it can show up as red streaks in the stool.  If you have any of these symptoms, see your  caregiver. HOW DOES THE DOCTOR TEST FOR POLYPS? The doctor can use four tests to check for polyps:  Digital rectal exam. The caregiver wears gloves and checks your rectum (the last part of the large intestine) to see if it feels normal. This test would find polyps only in the rectum. Your caregiver may need to do one of the other tests listed below to find polyps higher up in the intestine.   Barium enema. The caregiver puts a liquid called barium into your rectum before taking x-rays of your large intestine. Barium makes your intestine look white in the pictures. Polyps are dark, so they are easy to see.   Sigmoidoscopy. With this test, the caregiver can see inside your large intestine. A thin flexible tube is placed into your rectum. The device is called a sigmoidoscope, which has a light and a tiny video camera in it. The caregiver uses the sigmoidoscope to look at the last third of your large intestine.   Colonoscopy. This test is like sigmoidoscopy, but the caregiver looks at all of the large intestine. It usually requires sedation. This is the most common method for finding and removing polyps.  TREATMENT   The caregiver will remove the polyp during sigmoidoscopy or colonoscopy. The polyp is then tested for cancer.   If you have had polyps,  your caregiver may want you to get tested regularly in the future.  PREVENTION  There is not one sure way to prevent polyps. You might be able to lower your risk of getting them if you:  Eat more fruits and vegetables and less fatty food.   Do not smoke.   Avoid alcohol.   Exercise every day.   Lose weight if you are overweight.   Eating more calcium and folate can also lower your risk of getting polyps. Some foods that are rich in calcium are milk, cheese, and broccoli. Some foods that are rich in folate are chickpeas, kidney beans, and spinach.   Aspirin might help prevent polyps. Studies are under way.  Document Released: 09/09/2004  Document Revised: 08/26/2011 Document Reviewed: 02/15/2008 [facility] Patient Information 2012 [place], [place].  Diverticulosis Diverticulosis is a common condition that develops when small pouches (diverticula) form in the wall of the colon. The risk of diverticulosis increases with age. It happens more often in people who eat a low-fiber diet. Most individuals with diverticulosis have no symptoms. Those individuals with symptoms usually experience abdominal pain, constipation, or loose stools (diarrhea). HOME CARE INSTRUCTIONS   Increase the amount of fiber in your diet as directed by your caregiver or dietician. This may reduce symptoms of diverticulosis.   Your caregiver may recommend taking a dietary fiber supplement.   Drink at least 6 to 8 glasses of water each day to prevent constipation.   Try not to strain when you have a bowel movement.   Your caregiver may recommend avoiding nuts and seeds to prevent complications, although this is still an uncertain benefit.   Only take over-the-counter or prescription medicines for pain, discomfort, or fever as directed by your caregiver.  FOODS WITH HIGH FIBER CONTENT INCLUDE:  Fruits. Apple, peach, pear, tangerine, raisins, prunes.   Vegetables. Brussels sprouts, asparagus, broccoli, cabbage, carrot, cauliflower, romaine lettuce, spinach, summer squash, tomato, winter squash, zucchini.   Starchy Vegetables. Baked beans, kidney beans, lima beans, split peas, lentils, potatoes (with skin).   Grains. Whole wheat bread, brown rice, bran flake cereal, plain oatmeal, white rice, shredded wheat, bran muffins.  SEEK IMMEDIATE MEDICAL CARE IF:   You develop increasing pain or severe bloating.   You have an oral temperature above 102 F (38.9 C), not controlled by medicine.   You develop vomiting or bowel movements that are bloody or black.  Document Released: 09/10/2004 Document Revised: 08/26/2011 Document Reviewed: 05/14/2010 [facility]  Patient Information 2012 [place], [place].  Hemorrhoids Hemorrhoids are enlarged (dilated) veins around the rectum. There are 2 types of hemorrhoids, and the type of hemorrhoid is determined by its location. Internal hemorrhoids occur in the veins just inside the rectum.They are usually not painful, but they may bleed.However, they may poke through to the outside and become irritated and painful. External hemorrhoids involve the veins outside the anus and can be felt as a painful swelling or hard lump near the anus.They are often itchy and may crack and bleed. Sometimes clots will form in the veins. This makes them swollen and painful. These are called thrombosed hemorrhoids. CAUSES Causes of hemorrhoids include:  Pregnancy. This increases the pressure in the hemorrhoidal veins.   Constipation.   Straining to have a bowel movement.   Obesity.   Heavy lifting or other activity that caused you to strain.  TREATMENT Most of the time hemorrhoids improve in 1 to 2 weeks. However, if symptoms do not seem to be getting better or if you have  a lot of rectal bleeding, your caregiver may perform a procedure to help make the hemorrhoids get smaller or remove them completely.Possible treatments include:  Rubber band ligation. A rubber band is placed at the base of the hemorrhoid to cut off the circulation.   Sclerotherapy. A chemical is injected to shrink the hemorrhoid.   Infrared light therapy. Tools are used to burn the hemorrhoid.   Hemorrhoidectomy. This is surgical removal of the hemorrhoid.  HOME CARE INSTRUCTIONS   Increase fiber in your diet. Ask your caregiver about using fiber supplements.   Drink enough water and fluids to keep your urine clear or pale yellow.   Exercise regularly.   Go to the bathroom when you have the urge to have a bowel movement. Do not wait.   Avoid straining to have bowel movements.   Keep the anal area dry and clean.   Only take over-the-counter or  prescription medicines for pain, discomfort, or fever as directed by your caregiver.  If your hemorrhoids are thrombosed:  Take warm sitz baths for 20 to 30 minutes, 3 to 4 times per day.   If the hemorrhoids are very tender and swollen, place ice packs on the area as tolerated. Using ice packs between sitz baths may be helpful. Fill a plastic bag with ice. Place a towel between the bag of ice and your skin.   Medicated creams and suppositories may be used or applied as directed.   Do not use a donut-shaped pillow or sit on the toilet for long periods. This increases blood pooling and pain.  SEEK MEDICAL CARE IF:   You have increasing pain and swelling that is not controlled with your medicine.   You have uncontrolled bleeding.   You have difficulty or you are unable to have a bowel movement.   You have pain or inflammation outside the area of the hemorrhoids.   You have chills or an oral temperature above 102 F (38.9 C).  MAKE SURE YOU:   Understand these instructions.   Will watch your condition.   Will get help right away if you are not doing well or get worse.  Document Released: 12/11/2000 Document Revised: 08/26/2011 Document Reviewed: 04/17/2008 [facility] Patient Information 2012 [place], [place].

## 2012-01-28 ENCOUNTER — Telehealth: Payer: Self-pay | Admitting: *Deleted

## 2012-01-28 NOTE — Telephone Encounter (Signed)
  Follow up Call-  Call back number 01/27/2012  Post procedure Call Back phone  # [phone]  Permission to leave phone message Yes     Patient questions:  Do you have a fever, pain , or abdominal swelling? no Pain Score  0 *  Have you tolerated food without any problems? yes  Have you been able to return to your normal activities? yes  Do you have any questions about your discharge instructions: Diet   no Medications  no Follow up visit  no  Do you have questions or concerns about your Care? no  Actions: * If pain score is 4 or above: No action needed, pain <4.

## 2012-02-03 ENCOUNTER — Encounter: Payer: Self-pay | Admitting: Internal Medicine

## 2012-02-23 ENCOUNTER — Other Ambulatory Visit: Payer: Self-pay | Admitting: Family Medicine

## 2012-02-23 NOTE — Telephone Encounter (Signed)
Dr. Dayton Martes, is pt to continue to take vitamin D?

## 2012-03-11 ENCOUNTER — Other Ambulatory Visit: Payer: Self-pay | Admitting: Family Medicine

## 2012-03-30 ENCOUNTER — Other Ambulatory Visit: Payer: Self-pay | Admitting: Family Medicine

## 2012-05-02 ENCOUNTER — Ambulatory Visit (INDEPENDENT_AMBULATORY_CARE_PROVIDER_SITE_OTHER): Payer: PRIVATE HEALTH INSURANCE | Admitting: Family Medicine

## 2012-05-02 ENCOUNTER — Encounter: Payer: Self-pay | Admitting: Family Medicine

## 2012-05-02 VITALS — BP 130/62 | HR 72 | Temp 98.2°F | Wt 262.0 lb

## 2012-05-02 DIAGNOSIS — F329 Major depressive disorder, single episode, unspecified: Secondary | ICD-10-CM

## 2012-05-02 DIAGNOSIS — L989 Disorder of the skin and subcutaneous tissue, unspecified: Secondary | ICD-10-CM

## 2012-05-02 DIAGNOSIS — F419 Anxiety disorder, unspecified: Secondary | ICD-10-CM

## 2012-05-02 DIAGNOSIS — F32A Depression, unspecified: Secondary | ICD-10-CM

## 2012-05-02 MED ORDER — CYCLOBENZAPRINE HCL 10 MG PO TABS: 10.0000 mg | ORAL_TABLET | Freq: Three times a day (TID) | ORAL | Status: DC | PRN

## 2012-05-02 MED ORDER — BUSPIRONE HCL 15 MG PO TABS
7.5000 mg | ORAL_TABLET | Freq: Two times a day (BID) | ORAL | Status: AC
Start: 2012-05-02 — End: 2013-05-02

## 2012-05-02 MED ORDER — ALPRAZOLAM 0.5 MG PO TABS: 0.5000 mg | ORAL_TABLET | Freq: Every evening | ORAL | Status: DC | PRN

## 2012-05-02 MED ORDER — FLUOCINONIDE 0.05 % EX CREA: TOPICAL_CREAM | Freq: Two times a day (BID) | CUTANEOUS | Status: AC

## 2012-05-02 MED ORDER — DULOXETINE HCL 60 MG PO CPEP: 60.0000 mg | ORAL_CAPSULE | Freq: Every day | ORAL | Status: DC

## 2012-05-02 MED ORDER — DICLOFENAC SODIUM 75 MG PO TBEC: DELAYED_RELEASE_TABLET | ORAL | Status: DC

## 2012-05-02 NOTE — Progress Notes (Signed)
  Subjective:    Patient ID: Joan Russo, female    DOB: [birth year], 56 y.o.   MRN: [id number]  HPI  56 yo here for lesion on her chest.  Noticed it last week, started as a flat little circle, now raised and larger. Not itchy or painful. Does not remember any trauma or injury. Never had anything like this before. Has not tried anything for it.  Patient Active Problem List  Diagnoses  . Obesity, Class III, BMI 40-49.9 (morbid obesity)  . Anxiety and depression  . Vitamin D deficiency  . Fatigue  . Routine general medical examination at a health care facility  . Polyarthralgia  . Screening for colon cancer  . URI (upper respiratory infection)   Past Medical History  Diagnosis Date  . Obesity, Class III, BMI 40-49.9 (morbid obesity)   . Anxiety and depression   . Vitamin d deficiency   . Urinary incontinence   . OA (osteoarthritis)   . Anxiety   . Depression   . Osteopenia    Past Surgical History  Procedure Date  . 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 Smoking status: Never Smoker   . Smokeless tobacco: Never Used  . Alcohol Use: No   Family History  Problem Relation Age of Onset  . Cancer Mother 36    breast  . Cancer Daughter 50    breast  . Colon cancer Neg Hx   . Esophageal cancer Neg Hx   . Rectal cancer Neg Hx   . Stomach cancer Neg Hx    No Known Allergies Current Outpatient Prescriptions on File Prior to Visit  Medication Sig Dispense Refill  . alendronate (FOSAMAX) 70 MG tablet Take 1 tablet (70 mg total) by mouth every 7 (seven) days. Take with a full glass of water on an empty stomach.  4 tablet  6  . Ergocalciferol (VITAMIN D2 PO) Take 1 tablet by mouth once a week.        . Vitamin D, Ergocalciferol, (DRISDOL) 50000 UNITS CAPS TAKE 1 CAPSULE BY MOUTH WEEKLY AS DIRECTED  4 capsule  5  . DISCONTD: DULoxetine (CYMBALTA) 60 MG capsule Take 1 capsule (60 mg total) by mouth daily.  30 capsule  6   The PMH,  PSH, Social History, Family History, Medications, and allergies have been reviewed in [facility], and have been updated if relevant.    Review of Systems See HPI    Objective:   Physical Exam BP 130/62  Pulse 72  Temp(Src) 98.2 F (36.8 C) (Oral)  Wt 262 lb (118.842 kg)  General:  Well-developed,well-nourished,in no acute distress; alert,appropriate and cooperative throughout examination Head:  normocephalic and atraumatic.   Neurologic:  alert & oriented X3 and gait normal.   Skin:  1 cm circular, raised lesion on right upper chest, no central clearing. Psych:  Cognition and judgment appear intact. Alert and cooperative with normal attention span and concentration. No apparent delusions, illusions, hallucinations        Assessment & Plan:  1.  Skin lesion- Unclear etiology.  Not consistent with ring worm. Appears to be an inflammatory lesion.  Will try topical lidex x 2 weeks. If no improvement, remove lesion and send for biopsy. The patient indicates understanding of these issues and agrees with the plan.

## 2012-05-02 NOTE — Patient Instructions (Signed)
Good to see you. I think this is some type of inflammatory lesion. Let's try the topical steroid cream (lidex)- twice daily for two weeks. Call me if no improvement and schedule an appt to remove it.

## 2012-05-31 ENCOUNTER — Other Ambulatory Visit: Payer: Self-pay | Admitting: Family Medicine

## 2012-07-12 ENCOUNTER — Telehealth: Payer: Self-pay | Admitting: *Deleted

## 2012-07-12 NOTE — Telephone Encounter (Signed)
Pt has brought back application for [employer] patient assistance.  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  Form is on your desk.

## 2012-07-13 NOTE — Telephone Encounter (Signed)
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  Joan Russo should have all that paperwork. thanks

## 2012-07-13 NOTE — Telephone Encounter (Signed)
Form completed, faxed to [facility].

## 2012-07-20 ENCOUNTER — Other Ambulatory Visit: Payer: Self-pay

## 2012-07-20 MED ORDER — CYCLOBENZAPRINE HCL 10 MG PO TABS: 10.0000 mg | ORAL_TABLET | Freq: Three times a day (TID) | ORAL | Status: DC | PRN

## 2012-07-20 NOTE — Telephone Encounter (Signed)
Pt walked in left written message pt has lost her insurance; Request refill Cyclobenzaprine 10 mg for 90 day supply for back spasm to [facility]. [facility]. Left v/m for pt to call back with info.

## 2012-07-20 NOTE — Telephone Encounter (Signed)
Pt called back and wants rx sent to [facility] [address]Please advise.

## 2012-07-20 NOTE — Telephone Encounter (Signed)
Left v/m med sent to [facility] [address] as requested.

## 2012-08-01 ENCOUNTER — Telehealth: Payer: Self-pay | Admitting: *Deleted

## 2012-08-01 NOTE — Telephone Encounter (Signed)
Cymbalta received from [facility]- 4 bottles of # 30 each.  Lot number [id number], exp 04/2013.  Placed up front for pick up, left message on voice mail advising patient.

## 2012-08-19 ENCOUNTER — Other Ambulatory Visit: Payer: Self-pay | Admitting: Family Medicine

## 2012-09-20 ENCOUNTER — Other Ambulatory Visit: Payer: Self-pay | Admitting: Family Medicine

## 2012-10-24 ENCOUNTER — Telehealth: Payer: Self-pay

## 2012-10-24 ENCOUNTER — Other Ambulatory Visit: Payer: Self-pay | Admitting: Family Medicine

## 2012-10-24 NOTE — Telephone Encounter (Signed)
Left message asking pt to call back.  She needs to let us know when she needs refills so that we can call for the refill.

## 2012-10-24 NOTE — Telephone Encounter (Signed)
Pt left v/m to see if Cymbalta was ready for pick up; [facility]Please advise.

## 2012-10-25 ENCOUNTER — Other Ambulatory Visit: Payer: Self-pay | Admitting: *Deleted

## 2012-10-25 MED ORDER — ALPRAZOLAM 0.5 MG PO TABS: 0.5000 mg | ORAL_TABLET | Freq: Every evening | ORAL | Status: DC | PRN

## 2012-10-25 NOTE — Telephone Encounter (Signed)
Please enter as refill request. 

## 2012-10-25 NOTE — Telephone Encounter (Signed)
Pt is also asking for refill on alprazolam, uses [facility] [address].

## 2012-10-25 NOTE — Telephone Encounter (Signed)
Medicine called to [facility]. 

## 2012-10-25 NOTE — Telephone Encounter (Signed)
Done

## 2012-11-10 ENCOUNTER — Telehealth: Payer: Self-pay

## 2012-11-10 NOTE — Telephone Encounter (Signed)
Form signed and in my box for pick up.

## 2012-11-10 NOTE — Telephone Encounter (Signed)
New [facility] script for cymbalta placed on doctor's desk for signature.

## 2012-11-10 NOTE — Telephone Encounter (Signed)
Pt left v/m needs Cymbalta from [facility]. Pt has 2 weeks of med left.Please advise.

## 2012-11-10 NOTE — Telephone Encounter (Signed)
Form faxed

## 2012-11-21 ENCOUNTER — Telehealth: Payer: Self-pay | Admitting: *Deleted

## 2012-11-21 NOTE — Telephone Encounter (Signed)
Cymbalta received from [facility]. Four bottles of cymbalta 60 mg's lot number [id number], EXP 03/2014.  Placed at front desk for pick up, patient advised.

## 2012-11-23 ENCOUNTER — Other Ambulatory Visit: Payer: Self-pay | Admitting: Family Medicine

## 2012-12-26 ENCOUNTER — Other Ambulatory Visit: Payer: Self-pay | Admitting: Family Medicine

## 2012-12-26 NOTE — Telephone Encounter (Signed)
[facility]am [facility] 

## 2012-12-27 ENCOUNTER — Other Ambulatory Visit: Payer: Self-pay | Admitting: Family Medicine

## 2013-01-26 ENCOUNTER — Other Ambulatory Visit: Payer: Self-pay | Admitting: Family Medicine

## 2013-01-26 NOTE — Telephone Encounter (Signed)
[facility]am [facility] 

## 2013-01-26 NOTE — Telephone Encounter (Signed)
Duplicate request, this refill has already been taken care of.

## 2013-02-13 ENCOUNTER — Other Ambulatory Visit: Payer: Self-pay | Admitting: Family Medicine

## 2013-02-20 ENCOUNTER — Other Ambulatory Visit: Payer: Self-pay | Admitting: Family Medicine

## 2013-03-31 ENCOUNTER — Telehealth: Payer: Self-pay

## 2013-03-31 ENCOUNTER — Other Ambulatory Visit: Payer: Self-pay | Admitting: Family Medicine

## 2013-03-31 NOTE — Telephone Encounter (Signed)
Pt left v/m requesting release of next quarter for Cymbalta from [facility]. Pt request call back.

## 2013-04-04 NOTE — Telephone Encounter (Signed)
Form for cymbalta from [employer] patient assistance is on your desk.

## 2013-04-05 NOTE — Telephone Encounter (Signed)
Form signed and in my box. 

## 2013-04-05 NOTE — Telephone Encounter (Signed)
Form faxed

## 2013-04-06 NOTE — Telephone Encounter (Signed)
Pt called, states she has been out of cymbalta for several days, is having a very hard time- crying.  Advised her that shipment from [facility] be here before sometime next week, at the earliest.  Advised that we can give samples so that she can start taking them again. 4 sample boxes of #7 each given.  Lot number [id number], exp 01/2014.  Advised patient not to go without again, to call for samples or prescription.  Patient agreed.

## 2013-04-19 ENCOUNTER — Telehealth: Payer: Self-pay | Admitting: *Deleted

## 2013-04-19 NOTE — Telephone Encounter (Signed)
Cymbalta received from [facility].  4 bottles of #30 each.  Lot [id number], EXP 06/2014.  Advised patient, she will pick up.

## 2013-04-24 ENCOUNTER — Ambulatory Visit: Payer: PRIVATE HEALTH INSURANCE | Admitting: Family Medicine

## 2013-05-08 ENCOUNTER — Ambulatory Visit: Payer: PRIVATE HEALTH INSURANCE | Admitting: Family Medicine

## 2013-05-08 ENCOUNTER — Ambulatory Visit (INDEPENDENT_AMBULATORY_CARE_PROVIDER_SITE_OTHER): Payer: BC Managed Care – PPO | Admitting: Family Medicine

## 2013-05-08 ENCOUNTER — Encounter: Payer: Self-pay | Admitting: Family Medicine

## 2013-05-08 ENCOUNTER — Encounter: Payer: Self-pay | Admitting: *Deleted

## 2013-05-08 VITALS — BP 132/90 | HR 76 | Temp 97.9°F | Wt 275.0 lb

## 2013-05-08 DIAGNOSIS — Z13220 Encounter for screening for lipoid disorders: Secondary | ICD-10-CM

## 2013-05-08 DIAGNOSIS — F419 Anxiety disorder, unspecified: Secondary | ICD-10-CM

## 2013-05-08 DIAGNOSIS — F341 Dysthymic disorder: Secondary | ICD-10-CM

## 2013-05-08 DIAGNOSIS — F32A Depression, unspecified: Secondary | ICD-10-CM

## 2013-05-08 DIAGNOSIS — Z136 Encounter for screening for cardiovascular disorders: Secondary | ICD-10-CM

## 2013-05-08 DIAGNOSIS — Z0000 Encounter for general adult medical examination without abnormal findings: Secondary | ICD-10-CM

## 2013-05-08 LAB — LIPID PANEL
Cholesterol: 201 mg/dL — ABNORMAL HIGH (ref 0–200)
Total CHOL/HDL Ratio: 4
VLDL: 18.6 mg/dL (ref 0.0–40.0)

## 2013-05-08 LAB — COMPREHENSIVE METABOLIC PANEL
AST: 34 U/L (ref 0–37)
Alkaline Phosphatase: 78 U/L (ref 39–117)
Glucose, Bld: 94 mg/dL (ref 70–99)
Sodium: 140 mEq/L (ref 135–145)
Total Bilirubin: 0.4 mg/dL (ref 0.3–1.2)
Total Protein: 7.4 g/dL (ref 6.0–8.3)

## 2013-05-08 LAB — LDL CHOLESTEROL, DIRECT: Direct LDL: 133.8 mg/dL

## 2013-05-08 MED ORDER — DULOXETINE HCL 60 MG PO CPEP: 60.0000 mg | ORAL_CAPSULE | Freq: Every day | ORAL | Status: DC

## 2013-05-08 MED ORDER — CYCLOBENZAPRINE HCL 10 MG PO TABS: ORAL_TABLET | ORAL | Status: DC

## 2013-05-08 MED ORDER — DICLOFENAC SODIUM 75 MG PO TBEC: DELAYED_RELEASE_TABLET | ORAL | Status: DC

## 2013-05-08 MED ORDER — CLOBETASOL PROPIONATE 0.05 % EX OINT: TOPICAL_OINTMENT | CUTANEOUS | Status: DC

## 2013-05-08 NOTE — Progress Notes (Signed)
Subjective:    Patient ID: Joan Russo, female    DOB: 1956-11-15, 57 y.o.   MRN: [id number]  57 yo here for follow up.    Anxiety and depression- chronic issue.  Currently taking Cymbalta 60 mg daily and as needed Alprazolam.   She feels her symptoms are under better control than the last time I saw her.  OA- s/p left TKR in 2009.   Constant pain in lower back and bilateral knees. Denies radiculopathy. Flexeril and voltaren do help. Thinks she has OA in her fingers too.  She is aware her weight is an issue as well.  Has not had lab work done in two years. Patient Active Problem List   Diagnosis Date Noted  . Polyarthralgia 12/01/2011  . Screening for colon cancer 12/01/2011  . Fatigue 10/13/2011  . Obesity, Class III, BMI 40-49.9 (morbid obesity)   . Anxiety and depression   . Vitamin D deficiency    Past Medical History  Diagnosis Date  . Obesity, Class III, BMI 40-49.9 (morbid obesity)   . Anxiety and depression   . Vitamin D deficiency   . Urinary incontinence   . OA (osteoarthritis)   . Anxiety   . Depression   . Osteopenia    Past Surgical History  Procedure Laterality Date  . Total knee arthroplasty  08/2008    Left   History  Substance Use Topics  . Smoking status: Never Smoker   . Smokeless tobacco: Never Used  . Alcohol Use: No   Family History  Problem Relation Age of Onset  . Cancer Mother 64    breast  . Cancer Daughter 69    breast  . Colon cancer Neg Hx   . Esophageal cancer Neg Hx   . Rectal cancer Neg Hx   . Stomach cancer Neg Hx    No Known Allergies  Current outpatient prescriptions:alendronate (FOSAMAX) 70 MG tablet, Take 1 tablet (70 mg total) by mouth every 7 (seven) days. Take with a full glass of water on an empty stomach., Disp: 4 tablet, Rfl: 6;  ALPRAZolam (XANAX) 0.5 MG tablet, TAKE ONE TABLET BY MOUTH AT BEDTIME AS NEEDED, Disp: 30 tablet, Rfl: 0;  cyclobenzaprine (FLEXERIL) 10 MG tablet, TAKE ONE TABLET BY MOUTH THREE  TIMES DAILY FOR MUSCLE SPASM, Disp: 90 tablet, Rfl: 0 diclofenac (VOLTAREN) 75 MG EC tablet, TAKE ONE TABLET BY MOUTH TWICE DAILY, Disp: 60 tablet, Rfl: 0;  DULoxetine (CYMBALTA) 60 MG capsule, Take 1 capsule (60 mg total) by mouth daily., Disp: 30 capsule, Rfl: 6;  Ergocalciferol (VITAMIN D2 PO), Take 1 tablet by mouth once a week.  , Disp: , Rfl:    The PMH, PSH, Social History, Family History, Medications, and allergies have been reviewed in [facility], and have been updated if relevant.   Review of Systems See HPI     Objective:   Physical Exam BP 132/90  Pulse 76  Temp(Src) 97.9 F (36.6 C)  Wt 275 lb (124.739 kg)  BMI 57.49 kg/m2  Wt Readings from Last 3 Encounters:  05/08/13 275 lb (124.739 kg)  05/02/12 262 lb (118.842 kg)  01/27/12 267 lb (121.11 kg)     General:  Morbidly obese, alert,appropriate and cooperative throughout examination Head:  normocephalic and atraumatic.   Eyes:  vision grossly intact, pupils equal, pupils round, and pupils reactive to light.   Ears:  R ear normal and L ear normal.   Nose:  no external deformity.   Mouth:  good dentition.  Neck:  No deformities, masses, or tenderness noted. Lungs:  Normal respiratory effort, chest expands symmetrically. Lungs are clear to auscultation, no crackles or wheezes. Heart:  Normal rate and regular rhythm. S1 and S2 normal without gallop, murmur, click, rub or other extra sounds. Abdomen:  Bowel sounds positive,abdomen soft and non-tender without masses, organomegaly or hernias noted. Msk:  No deformity or scoliosis noted of thoracic or lumbar spine.   Extremities:  No clubbing, cyanosis, edema, or deformity noted with normal full range of motion of all joints, SLR neg bilaterally, pos lumbar paraspinous spasm.   Neurologic:  alert & oriented X3  Skin:  Intact without suspicious lesions or rashes Cervical Nodes:  No lymphadenopathy noted Psych:  Cognition and judgment appear intact. Alert and cooperative with  normal attention span and concentration. No apparent delusions, illusions, hallucinations     Assessment & Plan:  1. Anxiety and depression Stable.  Rx for cymbalta refilled. - DULoxetine (CYMBALTA) 60 MG capsule; Take 1 capsule (60 mg total) by mouth daily.  Dispense: 30 capsule; Refill: 6  2. Screening for ischemic heart disease  - Lipid Panel  3. OA- Discussed increasing exercise and working on her weight.  She wants to walk more now that the weather is improving. Rxs refilled.

## 2013-05-08 NOTE — Patient Instructions (Addendum)
Good to see you. We will call you with your lab results.   

## 2013-06-12 ENCOUNTER — Other Ambulatory Visit: Payer: Self-pay | Admitting: Family Medicine

## 2013-06-12 NOTE — Telephone Encounter (Signed)
Medicine called to pharmacy. 

## 2013-08-23 ENCOUNTER — Telehealth: Payer: Self-pay | Admitting: *Deleted

## 2013-08-23 NOTE — Telephone Encounter (Signed)
Form signed and on my desk.  I am unaware of a generic for cymbalta.

## 2013-08-23 NOTE — Telephone Encounter (Signed)
Forms faxed to [employer].  Advised pt that request may not be approved since she now has some prescription coverage.

## 2013-08-23 NOTE — Telephone Encounter (Signed)
Pt is requesting patient assistance from [employer] for cymbalta.  Form is on your desk for signature.  She now has insurance, so may no longer qualify.  If not, she is asking if there is a generic alternative that she can try.  I will fax in the forms and see what happens.

## 2013-08-30 ENCOUNTER — Other Ambulatory Visit: Payer: Self-pay | Admitting: *Deleted

## 2013-08-30 DIAGNOSIS — F329 Major depressive disorder, single episode, unspecified: Secondary | ICD-10-CM

## 2013-08-30 DIAGNOSIS — F419 Anxiety disorder, unspecified: Secondary | ICD-10-CM

## 2013-08-30 DIAGNOSIS — F32A Depression, unspecified: Secondary | ICD-10-CM

## 2013-08-30 MED ORDER — DULOXETINE HCL 60 MG PO CPEP
60.0000 mg | ORAL_CAPSULE | Freq: Every day | ORAL | Status: DC
Start: 2013-08-30 — End: 2014-07-08

## 2013-08-31 ENCOUNTER — Ambulatory Visit: Payer: BC Managed Care – PPO | Admitting: Internal Medicine

## 2013-09-01 ENCOUNTER — Telehealth: Payer: Self-pay

## 2013-09-01 DIAGNOSIS — N6310 Unspecified lump in the right breast, unspecified quadrant: Secondary | ICD-10-CM

## 2013-09-01 NOTE — Telephone Encounter (Signed)
Pt left v/m has found large lump rt breast; pt said breast center in [place] told pt did not need appt to see PCP but did need referral. Pt has strong hx of breast cancer in family; mother died from breast cancer.  Pt found lump in rt breast at 9'oclock position , size of quarter. No discharge and no soreness in breast. Pt wants mammogram ASAP, Dr Dayton Martes said not to send urgent calls today.Please advise.

## 2013-09-01 NOTE — Telephone Encounter (Signed)
Please let her know that I recommend a physician's exam before proceeding It helps me decide if seeing a breast surgeon is appropriate even if the mammogram is okay  I am not sure how Dr Dayton Martes would handle this Let the patient know I think she should wait for Monday to see how she wants to handle this

## 2013-09-04 ENCOUNTER — Other Ambulatory Visit: Payer: Self-pay | Admitting: Family Medicine

## 2013-09-04 ENCOUNTER — Ambulatory Visit: Payer: BC Managed Care – PPO | Admitting: Family Medicine

## 2013-09-04 NOTE — Telephone Encounter (Signed)
If she cannot come in, ok to proceed with mammogram.  I will place referral.

## 2013-09-04 NOTE — Telephone Encounter (Signed)
Advised patient, she will keep her appt tomorrow.

## 2013-09-04 NOTE — Telephone Encounter (Signed)
Dr. Dayton Martes this patient has an appt with you tomorrow, should l tell her to wait and let you exam her first? I wasn't here on Friday and this was in my basket.

## 2013-09-05 ENCOUNTER — Other Ambulatory Visit: Payer: Self-pay | Admitting: Family Medicine

## 2013-09-05 ENCOUNTER — Encounter: Payer: Self-pay | Admitting: Family Medicine

## 2013-09-05 ENCOUNTER — Ambulatory Visit (INDEPENDENT_AMBULATORY_CARE_PROVIDER_SITE_OTHER): Payer: BC Managed Care – PPO | Admitting: Family Medicine

## 2013-09-05 VITALS — BP 110/74 | HR 88 | Temp 98.3°F | Wt 283.0 lb

## 2013-09-05 DIAGNOSIS — N6310 Unspecified lump in the right breast, unspecified quadrant: Secondary | ICD-10-CM

## 2013-09-05 DIAGNOSIS — N63 Unspecified lump in unspecified breast: Secondary | ICD-10-CM

## 2013-09-05 MED ORDER — CYCLOBENZAPRINE HCL 10 MG PO TABS: ORAL_TABLET | ORAL | Status: DC

## 2013-09-05 MED ORDER — DICLOFENAC SODIUM 75 MG PO TBEC: DELAYED_RELEASE_TABLET | ORAL | Status: DC

## 2013-09-05 MED ORDER — ALPRAZOLAM 0.5 MG PO TABS: ORAL_TABLET | ORAL | Status: DC

## 2013-09-05 NOTE — Patient Instructions (Addendum)
Please go ahead and schedule your mammogram.

## 2013-09-05 NOTE — Progress Notes (Signed)
  Subjective:    Patient ID: Joan Russo, female    DOB: Mar 09, 1956, 57 y.o.   MRN: [id number]  HPI  57 yo female here for right breast mass she noticed four days ago.  Mass is not painful. Both her mother and daughter had breast CA.  Overdue for mammogram - last mammogram in 2009.  Denies any night sweats, fatigue or nipple discharge.  Patient Active Problem List   Diagnosis Date Noted  . Breast mass, right 09/05/2013  . Polyarthralgia 12/01/2011  . Screening for colon cancer 12/01/2011  . Fatigue 10/13/2011  . Obesity, Class III, BMI 40-49.9 (morbid obesity)   . Anxiety and depression   . Vitamin D deficiency    Past Medical History  Diagnosis Date  . Obesity, Class III, BMI 40-49.9 (morbid obesity)   . Anxiety and depression   . Vitamin D deficiency   . Urinary incontinence   . OA (osteoarthritis)   . Anxiety   . Depression   . Osteopenia    Past Surgical History  Procedure Laterality Date  . Total knee arthroplasty  08/2008    Left   History  Substance Use Topics  . Smoking status: Never Smoker   . Smokeless tobacco: Never Used  . Alcohol Use: No   Family History  Problem Relation Age of Onset  . Cancer Mother 47    breast  . Cancer Daughter 80    breast  . Colon cancer Neg Hx   . Esophageal cancer Neg Hx   . Rectal cancer Neg Hx   . Stomach cancer Neg Hx    No Known Allergies Current Outpatient Prescriptions on File Prior to Visit  Medication Sig Dispense Refill  . alendronate (FOSAMAX) 70 MG tablet Take 1 tablet (70 mg total) by mouth every 7 (seven) days. Take with a full glass of water on an empty stomach.  4 tablet  6  . clobetasol ointment (TEMOVATE) 0.05 % Apply twice daily as needed  30 g  0  . DULoxetine (CYMBALTA) 60 MG capsule Take 1 capsule (60 mg total) by mouth daily.  30 capsule  2  . Ergocalciferol (VITAMIN D2 PO) Take 1 tablet by mouth once a week.         No current facility-administered medications on file prior to visit.    The PMH, PSH, Social History, Family History, Medications, and allergies have been reviewed in [facility], and have been updated if relevant.      Review of Systems    See HPI Objective:   Physical Exam BP 110/74  Pulse 88  Temp(Src) 98.3 F (36.8 C)  Wt 283 lb (128.368 kg)  BMI 59.16 kg/m2 Gen:  Obese, pleasant, NAD Breasts: Right breast- palpable mass at 9 oclock approx 1.5 cm, non TTP  No nipple discharge or retraction, no lymphadenopathy    Assessment & Plan:  1. Breast mass, right Order placed for bilateral diagnostic mammogram. Pt to call to verify date of appointment. The patient indicates understanding of these issues and agrees with the plan.

## 2013-09-11 ENCOUNTER — Telehealth: Payer: Self-pay | Admitting: *Deleted

## 2013-09-11 NOTE — Telephone Encounter (Signed)
Cymbalta 60 mg's received from [employer].  4 bottles of #30 each.  Lot [id number], Exp 07/2014.  Advised patient, she will pick up.

## 2013-09-14 ENCOUNTER — Ambulatory Visit
Admission: RE | Admit: 2013-09-14 | Discharge: 2013-09-14 | Disposition: A | Discharge status: Home / Self Care | Payer: BC Managed Care – PPO | Source: Ambulatory Visit | Attending: Family Medicine | Admitting: Family Medicine

## 2013-09-14 DIAGNOSIS — N6310 Unspecified lump in the right breast, unspecified quadrant: Secondary | ICD-10-CM

## 2013-10-13 ENCOUNTER — Ambulatory Visit (INDEPENDENT_AMBULATORY_CARE_PROVIDER_SITE_OTHER): Payer: BC Managed Care – PPO | Admitting: Family Medicine

## 2013-10-13 ENCOUNTER — Other Ambulatory Visit: Payer: Self-pay | Admitting: Family Medicine

## 2013-10-13 ENCOUNTER — Encounter: Payer: Self-pay | Admitting: Family Medicine

## 2013-10-13 VITALS — BP 142/80 | HR 83 | Temp 98.4°F | Ht <= 58 in | Wt 285.2 lb

## 2013-10-13 DIAGNOSIS — R21 Rash and other nonspecific skin eruption: Secondary | ICD-10-CM

## 2013-10-13 DIAGNOSIS — N63 Unspecified lump in unspecified breast: Secondary | ICD-10-CM

## 2013-10-13 DIAGNOSIS — N6310 Unspecified lump in the right breast, unspecified quadrant: Secondary | ICD-10-CM

## 2013-10-13 MED ORDER — TRIAMCINOLONE ACETONIDE 0.1 % EX CREA: TOPICAL_CREAM | Freq: Two times a day (BID) | CUTANEOUS | Status: DC

## 2013-10-13 NOTE — Assessment & Plan Note (Signed)
Likely skin reaction to sun exposure.  Treat with topical steroid. No sign of current infeciton.

## 2013-10-13 NOTE — Progress Notes (Signed)
Subjective:    Patient ID: Joan Russo, female    DOB: 22-Mar-1956, 57 y.o.   MRN: [id number]  HPI  57 year old  Morbidly obese female pt of Dr. Elmer Sow present for re-eval for breast mass.  She was seen on 08/2013 for right breast mass. Right breast- palpable mass at 9 oclock approx 1.5 cm, non TTP  No nipple discharge or retraction, no lymphadenopathy  Sent for B diagnositic mammo and [place]: both showed no evidence of mass.   Since then  That area has resolved and now she has noted a new lesion: there is a different mass lower in right breast... Size of long finger Mass is not painful.  No redness. No nipple discharge.  She noted last night that her nipple on the right is softer then previously , like it collapses down underneath.    Both her mother and daughter (age 60 ) had breast CA. Daughter with invasive inflammatory Breast Cancer.  She is postmenopausal.  no breast injury.   She had bad sunburn on right cheek several months ago. Not healing, always peeling. Has also noted  Red bumps, dry flaky on top. She "messes with them a lot" No blisters, no pustules.   Review of Systems  Constitutional: Negative for fever and fatigue.  HENT: Negative for ear pain.   Eyes: Negative for pain.  Respiratory: Negative for chest tightness and shortness of breath.   Cardiovascular: Negative for chest pain, palpitations and leg swelling.  Gastrointestinal: Negative for abdominal pain.  Genitourinary: Negative for dysuria.       Objective:   Physical Exam  Constitutional: Vital signs are normal. She appears well-developed and well-nourished. She is cooperative.  Non-toxic appearance. She does not appear ill. No distress.  Morbidly obese  HENT:  Head: Normocephalic.  Right Ear: Hearing, tympanic membrane, external ear and ear canal normal. Tympanic membrane is not erythematous, not retracted and not bulging.  Left Ear: Hearing, tympanic membrane, external ear and ear canal normal.  Tympanic membrane is not erythematous, not retracted and not bulging.  Nose: No mucosal edema or rhinorrhea. Right sinus exhibits no maxillary sinus tenderness and no frontal sinus tenderness. Left sinus exhibits no maxillary sinus tenderness and no frontal sinus tenderness.  Mouth/Throat: Uvula is midline, oropharynx is clear and moist and mucous membranes are normal.  Eyes: Conjunctivae, EOM and lids are normal. Pupils are equal, round, and reactive to light. Lids are everted and swept, no foreign bodies found.  Neck: Trachea normal and normal range of motion. Neck supple. Carotid bruit is not present. No mass and no thyromegaly present.  Cardiovascular: Normal rate, regular rhythm, S1 normal, S2 normal, normal heart sounds, intact distal pulses and normal pulses.  Exam reveals no gallop and no friction rub.   No murmur heard. Pulmonary/Chest: Effort normal and breath sounds normal. Not tachypneic. No respiratory distress. She has no decreased breath sounds. She has no wheezes. She has no rhonchi. She has no rales.  Abdominal: Soft. Normal appearance and bowel sounds are normal. There is no tenderness.  Genitourinary: No breast swelling, tenderness, discharge or bleeding.  1.5 cm x 2.5 cm oblong  Mass at 12 o'clock in right breast... No redness no pain.  Neurological: She is alert.  Skin: Skin is warm, dry and intact. Rash noted. Rash is macular. Rash is not papular, not pustular and not vesicular.  Erythema right cheek > left cheek, no nodules on nose, but several nodules on left lower jowel, with dry skin  assocaited.  Psychiatric: Her speech is normal and behavior is normal. Judgment and thought content normal. Her mood appears not anxious. Cognition and memory are normal. She does not exhibit a depressed mood.          Assessment & Plan:

## 2013-10-13 NOTE — Patient Instructions (Signed)
Apply cream to rash twice daily. Call if rash is not improving. Stop at the front desk to set up referral to breast center.

## 2013-10-13 NOTE — Assessment & Plan Note (Signed)
Mass at 23 o' clock. Recent nml mammogram, diagnostic and [place]... Nml. Recommmend  eval with imaging.

## 2013-10-17 ENCOUNTER — Ambulatory Visit
Admission: RE | Admit: 2013-10-17 | Discharge: 2013-10-17 | Disposition: A | Discharge status: Home / Self Care | Payer: BC Managed Care – PPO | Source: Ambulatory Visit | Attending: Family Medicine | Admitting: Family Medicine

## 2013-10-17 ENCOUNTER — Other Ambulatory Visit: Payer: Self-pay | Admitting: Family Medicine

## 2013-10-17 ENCOUNTER — Other Ambulatory Visit (HOSPITAL_COMMUNITY)
Admission: RE | Admit: 2013-10-17 | Discharge: 2013-10-17 | Disposition: A | Discharge status: Home / Self Care | Payer: BC Managed Care – PPO | Source: Ambulatory Visit | Attending: Diagnostic Radiology | Admitting: Diagnostic Radiology

## 2013-10-17 DIAGNOSIS — N6459 Other signs and symptoms in breast: Secondary | ICD-10-CM | POA: Insufficient documentation

## 2013-10-17 DIAGNOSIS — N6310 Unspecified lump in the right breast, unspecified quadrant: Secondary | ICD-10-CM

## 2013-11-30 ENCOUNTER — Telehealth: Payer: Self-pay

## 2013-11-30 ENCOUNTER — Other Ambulatory Visit: Payer: Self-pay | Admitting: Family Medicine

## 2013-11-30 NOTE — Telephone Encounter (Signed)
Called to [facility] 

## 2013-11-30 NOTE — Telephone Encounter (Signed)
Pt left v/m that pt needs to get paperwork started for Cymbalta from [employer]; pt has 6 weeks of med.

## 2013-11-30 NOTE — Telephone Encounter (Signed)
Last office visit 10/03/2013 with Dr. Ermalene Searing.  (Dr. Elmer Sow Patient)  Ok to refill?

## 2013-12-22 NOTE — Telephone Encounter (Signed)
[employer] assistance form for cymbalta filled out and on [employer] for [employer].

## 2014-01-09 ENCOUNTER — Telehealth: Payer: Self-pay

## 2014-01-09 NOTE — Telephone Encounter (Signed)
Left detailed message on VM letting pt know her Cymbalta shipment has come in for pick and is up front for pick up

## 2014-02-06 ENCOUNTER — Other Ambulatory Visit: Payer: Self-pay | Admitting: Family Medicine

## 2014-02-06 NOTE — Telephone Encounter (Signed)
Last office visit 10/13/2013 with Dr. Bedsole.  Ok to refill? 

## 2014-02-06 NOTE — Telephone Encounter (Signed)
Last office visit 10/13/2013 with Dr. Diona Browner.  Ok to refill?

## 2014-02-06 NOTE — Telephone Encounter (Signed)
Called to [facility]. 

## 2014-03-19 ENCOUNTER — Ambulatory Visit: Payer: BC Managed Care – PPO | Admitting: Internal Medicine

## 2014-04-27 ENCOUNTER — Ambulatory Visit (INDEPENDENT_AMBULATORY_CARE_PROVIDER_SITE_OTHER): Payer: BC Managed Care – PPO | Admitting: Internal Medicine

## 2014-04-27 ENCOUNTER — Encounter: Payer: Self-pay | Admitting: Internal Medicine

## 2014-04-27 VITALS — BP 128/78 | HR 71 | Temp 97.7°F | Wt 274.0 lb

## 2014-04-27 DIAGNOSIS — K148 Other diseases of tongue: Secondary | ICD-10-CM

## 2014-04-27 DIAGNOSIS — F329 Major depressive disorder, single episode, unspecified: Secondary | ICD-10-CM

## 2014-04-27 DIAGNOSIS — F341 Dysthymic disorder: Secondary | ICD-10-CM

## 2014-04-27 DIAGNOSIS — R05 Cough: Secondary | ICD-10-CM

## 2014-04-27 DIAGNOSIS — R059 Cough, unspecified: Secondary | ICD-10-CM

## 2014-04-27 DIAGNOSIS — R1310 Dysphagia, unspecified: Secondary | ICD-10-CM

## 2014-04-27 DIAGNOSIS — F419 Anxiety disorder, unspecified: Secondary | ICD-10-CM

## 2014-04-27 DIAGNOSIS — F32A Depression, unspecified: Secondary | ICD-10-CM

## 2014-04-27 MED ORDER — ALPRAZOLAM 0.5 MG PO TABS: 0.5000 mg | ORAL_TABLET | Freq: Every evening | ORAL | Status: DC | PRN

## 2014-04-27 MED ORDER — PANTOPRAZOLE SODIUM 40 MG PO TBEC: 40.0000 mg | DELAYED_RELEASE_TABLET | Freq: Every day | ORAL | Status: DC

## 2014-04-27 NOTE — Assessment & Plan Note (Signed)
Worse right now d/t recent death of her husband She is on cymbalta 60 mg (no proven effectiveness at higher doses) Will increase xanax BID (short term only secondary to grief reaction)

## 2014-04-27 NOTE — Progress Notes (Signed)
Subjective:    Patient ID: Joan Russo, female    DOB: 06-27-1956, 58 y.o.   MRN: [id number]  HPI  Pt presents to the clinic today with c/o her throat feeling like it is going to close up. This started 2-3 months ago. She does have some difficulty swallowing and often coughs when she eats. She has not actually choked on her food. Her daughter also reports that over the last month, she can see a black spot on the back of her mothers tongue. She has had worsening indigestion lately but is not taking anything for it. Additionally, she c/o worsening anxiety. She reports her husband just passed away yesterday. She is taking cymbalta and xanax. She reports that she would like her xanax increased temporarily.  Review of Systems      Past Medical History  Diagnosis Date  . Obesity, Class III, BMI 40-49.9 (morbid obesity)   . Anxiety and depression   . Vitamin D deficiency   . Urinary incontinence   . OA (osteoarthritis)   . Anxiety   . Depression   . Osteopenia     Current Outpatient Prescriptions  Medication Sig Dispense Refill  . ALPRAZolam (XANAX) 0.5 MG tablet TAKE 1 TABLET BY MOUTH AT BEDTIME  30 tablet  0  . DULoxetine (CYMBALTA) 60 MG capsule Take 1 capsule (60 mg total) by mouth daily.  30 capsule  2  . clobetasol ointment (TEMOVATE) 0.05 % Apply twice daily as needed  30 g  0  . diclofenac (VOLTAREN) 75 MG EC tablet TAKE ONE TABLET BY MOUTH TWICE DAILY  60 tablet  3   No current facility-administered medications for this visit.    No Known Allergies  Family History  Problem Relation Age of Onset  . Cancer Mother 32    breast  . Cancer Daughter 27    breast  . Colon cancer Neg Hx   . Esophageal cancer Neg Hx   . Rectal cancer Neg Hx   . Stomach cancer Neg Hx     History   Social History  . Marital Status: Married    Spouse Name: N/A    Number of Children: N/A  . Years of Education: N/A   Occupational History  . Not on file.   Social History Main  Topics  . Smoking status: Never Smoker   . Smokeless tobacco: Never Used  . Alcohol Use: No  . Drug Use: No  . Sexual Activity: Not on file   Other Topics Concern  . Not on file   Social History Narrative  . No narrative on file     Constitutional: Denies fever, malaise, fatigue, headache or abrupt weight changes.  HEENT: Pt reports black spot on tongue. Denies eye pain, eye redness, ear pain, ringing in the ears, wax buildup, runny nose, nasal congestion, bloody nose, or sore throat. Respiratory: Pt reports shortness of breath (chrionic) and cough. Denies difficulty breathing, shortness of breath, cough or sputum production.   Cardiovascular: Denies chest pain, chest tightness, palpitations or swelling in the hands or feet.  Gastrointestinal: Denies abdominal pain, bloating, constipation, diarrhea or blood in the stool.  Psych: Pt reports anxiety and depression. Denies SI/HI.  No other specific complaints in a complete review of systems (except as listed in HPI above).  Objective:   Physical Exam  Wt 274 lb (124.286 kg) Wt Readings from Last 3 Encounters:  04/27/14 274 lb (124.286 kg)  10/13/13 285 lb 4 oz (129.389 kg)  09/05/13  283 lb (128.368 kg)    General: Appears her stated age, obese but well developed, well nourished in NAD.  HEENT: Head: normal shape and size; Eyes: sclera white, no icterus, conjunctiva pink, PERRLA and EOMs intact; Ears: Tm's gray and intact, normal light reflex; Nose: mucosa pink and moist, septum midline; Throat/Mouth: Teeth present, mucosa pink and moist, no exudate, tonsil 2+, inflamed (purple) taste bud noted on right lateral tongue. No lesions or ulcerations noted.  Cardiovascular: Normal rate and rhythm. S1,S2 noted.  No murmur, rubs or gallops noted. No JVD or BLE edema. No carotid bruits noted. Pulmonary/Chest: Normal effort and diminishe breath sounds. No respiratory distress. No wheezes, rales or ronchi noted.  Abdomen: Soft and nontender.  Normal bowel sounds, no bruits noted. No distention or masses noted. Liver, spleen and kidneys non palpable. Psychiatric: Mood tearful today, but affect normal. Behavior is normal. Judgment and thought content normal.     BMET    Component Value Date/Time   NA 140 05/08/2013 0812   K 5.0 05/08/2013 0812   CL 104 05/08/2013 0812   CO2 28 05/08/2013 0812   GLUCOSE 94 05/08/2013 0812   BUN 15 05/08/2013 0812   CREATININE 0.9 05/08/2013 0812   CALCIUM 9.4 05/08/2013 0812   GFRNONAA >60 09/27/2008 0431   GFRAA  Value: >60        The eGFR has been calculated using the MDRD equation. This calculation has not been validated in all clinical 09/27/2008 0431    Lipid Panel     Component Value Date/Time   CHOL 201* 05/08/2013 0812   TRIG 93.0 05/08/2013 0812   HDL 51.40 05/08/2013 0812   CHOLHDL 4 05/08/2013 0812   VLDL 18.6 05/08/2013 0812    CBC    Component Value Date/Time   WBC 9.0 10/13/2011 1143   RBC 4.66 10/13/2011 1143   HGB 14.1 10/13/2011 1143   HCT 42.3 10/13/2011 1143   PLT 262.0 10/13/2011 1143   MCV 90.9 10/13/2011 1143   MCHC 33.3 10/13/2011 1143   RDW 14.5 10/13/2011 1143   LYMPHSABS 4.3* 10/13/2011 1143   MONOABS 0.5 10/13/2011 1143   EOSABS 0.3 10/13/2011 1143   BASOSABS 0.0 10/13/2011 1143    Hgb A1C No results found for this basename: HGBA1C         Assessment & Plan:   Cough, dysphagia:  Lets try protonix 40 mg daily x 2 week to see if this improves your symptoms Will go ahead and refer to GI for further evaluation and possible EGD Discussed possibility of cancelling GI appt if protonix improves her symptoms.  Tongue lesion:  Appears that she may have bit her tongue No treatment at this time Will continue to monitor Watch for increased size, pain or drainage  RTC as needed or if any symptoms persist or worsen

## 2014-04-27 NOTE — Progress Notes (Signed)
Pre visit review using our clinic review tool, if applicable. No additional management support is needed unless otherwise documented below in the visit note. 

## 2014-04-27 NOTE — Patient Instructions (Addendum)
Grief Reaction  Grief is a normal response to the death of someone close to you. Feelings of fear, anger, and guilt can affect almost everyone who loses someone they love. Symptoms of depression are also common. These include problems with sleep, loss of appetite, and lack of energy. These grief reaction symptoms often last for weeks to months after a loss. They may also return during special times that remind you of the person you lost, such as an anniversary or birthday.  Anxiety, insomnia, irritability, and deep depression may last beyond the period of normal grief. If you experience these feelings for 6 months or longer, you may have clinical depression. Clinical depression requires further medical attention. If you think that you have clinical depression, you should contact your caregiver. If you have a history of depression and or a family history of depression, you are at greater risk of clinical depression. You are also at greater risk of developing clinical depression if the loss was traumatic or the loss was of someone with whom you had unresolved issues.   A grief reaction can become complicated by being blocked. This means being unable to cry or express extreme emotions. This may prolong the grieving period and worsen the emotional effects of the loss. Mourning is a natural event in human life. A healthy grief reaction is one that is not blocked . It requires a time of sadness and readjustment.It is very important to share your sorrow and fear with others, especially close friends and family. Professional counselors and clergy can also help you process your grief.  Document Released: 12/14/2005 Document Revised: 03/07/2012 Document Reviewed: 08/24/2006  [employer] Patient Information 2014 [employer].

## 2014-05-02 ENCOUNTER — Other Ambulatory Visit: Payer: Self-pay

## 2014-05-02 ENCOUNTER — Telehealth: Payer: Self-pay

## 2014-05-02 NOTE — Telephone Encounter (Signed)
Called requesting paperwork to be filled out for Rx assistance--Joan Russo--  lmovm letting pt know we need the drug company to fax the needed paperwork as i did not see information on where to call or fax in chart

## 2014-05-11 NOTE — Telephone Encounter (Signed)
i did attempt to call the pt and lmovm--but pt is aware that she needs to get the patient assistance paperwork faxed to [place] for Dr Deborra Medina to fill out--so if you could follow up with her as she told me she was almost out of medication--thanks

## 2014-06-25 ENCOUNTER — Other Ambulatory Visit: Payer: Self-pay | Admitting: Family Medicine

## 2014-07-08 ENCOUNTER — Other Ambulatory Visit: Payer: Self-pay | Admitting: Family Medicine

## 2014-07-09 NOTE — Telephone Encounter (Signed)
Received refill request electronically. Last office visit acute/see note 04/27/14. Last refill #30/2. Is it okay to refill medication?

## 2014-07-22 ENCOUNTER — Other Ambulatory Visit: Payer: Self-pay | Admitting: Family Medicine

## 2014-08-31 ENCOUNTER — Other Ambulatory Visit: Payer: Self-pay | Admitting: Internal Medicine

## 2014-08-31 ENCOUNTER — Other Ambulatory Visit: Payer: Self-pay | Admitting: Family Medicine

## 2014-08-31 NOTE — Telephone Encounter (Signed)
Last filled 04/27/14 by Webb Silversmith at an acute visit--please advise

## 2014-08-31 NOTE — Telephone Encounter (Signed)
Last office visit 04/27/2014 with Webb Silversmith for Depression/Anxiety.  Last refills: Cymbalta 07/09/2014 for #30 with 2 refills; Diclofenac 07/23/2014 for #60 with no refills.  Ok to refill?

## 2014-09-07 ENCOUNTER — Ambulatory Visit: Payer: BC Managed Care – PPO | Admitting: Family Medicine

## 2014-09-19 ENCOUNTER — Encounter: Payer: Self-pay | Admitting: Family Medicine

## 2014-09-19 ENCOUNTER — Ambulatory Visit (INDEPENDENT_AMBULATORY_CARE_PROVIDER_SITE_OTHER): Payer: BC Managed Care – PPO | Admitting: Family Medicine

## 2014-09-19 VITALS — BP 128/82 | HR 67 | Temp 97.9°F | Ht <= 58 in | Wt 275.5 lb

## 2014-09-19 DIAGNOSIS — M2550 Pain in unspecified joint: Secondary | ICD-10-CM

## 2014-09-19 DIAGNOSIS — F329 Major depressive disorder, single episode, unspecified: Secondary | ICD-10-CM

## 2014-09-19 DIAGNOSIS — L210 Seborrhea capitis: Secondary | ICD-10-CM | POA: Insufficient documentation

## 2014-09-19 DIAGNOSIS — F419 Anxiety disorder, unspecified: Secondary | ICD-10-CM

## 2014-09-19 DIAGNOSIS — F32A Depression, unspecified: Secondary | ICD-10-CM

## 2014-09-19 DIAGNOSIS — F4321 Adjustment disorder with depressed mood: Secondary | ICD-10-CM | POA: Insufficient documentation

## 2014-09-19 DIAGNOSIS — Z23 Encounter for immunization: Secondary | ICD-10-CM

## 2014-09-19 DIAGNOSIS — F341 Dysthymic disorder: Secondary | ICD-10-CM

## 2014-09-19 DIAGNOSIS — L218 Other seborrheic dermatitis: Secondary | ICD-10-CM

## 2014-09-19 MED ORDER — PANTOPRAZOLE SODIUM 40 MG PO TBEC: DELAYED_RELEASE_TABLET | ORAL | Status: DC

## 2014-09-19 MED ORDER — DICLOFENAC SODIUM 75 MG PO TBEC: DELAYED_RELEASE_TABLET | ORAL | Status: DC

## 2014-09-19 MED ORDER — KETOCONAZOLE 2 % EX SHAM: 1.0000 "application " | MEDICATED_SHAMPOO | CUTANEOUS | Status: DC

## 2014-09-19 NOTE — Assessment & Plan Note (Signed)
>  25 minutes spent in face to face time with patient, >50% spent in counselling or coordination of care Feels she is coping ok- tearful but very appropriate. Has good support at home.

## 2014-09-19 NOTE — Assessment & Plan Note (Signed)
New- eRx sent for ketoconazole shampoo to use twice weekly for no more than 4 weeks. Call or return to clinic prn if these symptoms worsen or fail to improve as anticipated. The patient indicates understanding of these issues and agrees with the plan.

## 2014-09-19 NOTE — Assessment & Plan Note (Signed)
Improved. Continue current dose of Cymbalta.

## 2014-09-19 NOTE — Patient Instructions (Addendum)
Great to see you. Please try Ketoconazole shampoo twice a week for 4 weeks. Call me with an update.  I am so sorry for your loss.

## 2014-09-19 NOTE — Progress Notes (Signed)
Subjective:    Patient ID: Joan Russo, female    DOB: [birth year], 58 y.o.   MRN: [id number]  58 yo here for follow up.    Anxiety and depression- chronic issue but deteriorated after her husband's sudden death several months ago. Saw Webb Silversmith on 05/11/2014 shortly after his death- note reviewed. She advised increasing xanax to bid.  She is doing much better now.  Still taking Cymbalta 60 mg daily but has not taken xanax in weeks.    She denies any current anxiety or depression. Does get tearful when she talks about her husband.  Also she was fighting with him prior to his death so she does feel guilty about that. Her kids seem to be coping ok.    OA- s/p left TKR in 2009.   Constant pain in lower back and bilateral knees.  Itchy scalp- ongoing for months to years.  Has tried OTC topical head and shoulders shampoo without improvement of symptoms. Patient Active Problem List   Diagnosis Date Noted  . Dandruff 09/19/2014  . Breast mass, right 09/05/2013  . Polyarthralgia 12/01/2011  . Fatigue 10/13/2011  . Obesity, Class III, BMI 40-49.9 (morbid obesity)   . Anxiety and depression   . Vitamin D deficiency    Past Medical History  Diagnosis Date  . Obesity, Class III, BMI 40-49.9 (morbid obesity)   . Anxiety and depression   . Vitamin D deficiency   . Urinary incontinence   . OA (osteoarthritis)   . Anxiety   . Depression   . Osteopenia    Past Surgical History  Procedure Laterality Date  . Total knee arthroplasty  08/2008    Left   History  Substance Use Topics  . Smoking status: Never Smoker   . Smokeless tobacco: Never Used  . Alcohol Use: No   Family History  Problem Relation Age of Onset  . Cancer Mother 93    breast  . Cancer Daughter 90    breast  . Colon cancer Neg Hx   . Esophageal cancer Neg Hx   . Rectal cancer Neg Hx   . Stomach cancer Neg Hx    No Known Allergies  Current outpatient prescriptions:ALPRAZolam (XANAX) 0.5 MG tablet, Take 1  tablet (0.5 mg total) by mouth at bedtime as needed for anxiety., Disp: 60 tablet, Rfl: 0;  clobetasol ointment (TEMOVATE) 0.05 %, Apply twice daily as needed, Disp: 30 g, Rfl: 0;  diclofenac (VOLTAREN) 75 MG EC tablet, TAKE ONE TABLET BY MOUTH TWICE DAILY, Disp: 60 tablet, Rfl: 5 DULoxetine (CYMBALTA) 60 MG capsule, TAKE 1 CAPSULE (60 MG TOTAL) BY MOUTH DAILY., Disp: 30 capsule, Rfl: 2;  pantoprazole (PROTONIX) 40 MG tablet, TAKE 1 TABLET (40 MG TOTAL) BY MOUTH DAILY., Disp: 30 tablet, Rfl: 5;  ketoconazole (NIZORAL) 2 % shampoo, Apply 1 application topically 2 (two) times a week., Disp: 120 mL, Rfl: 0   The PMH, PSH, Social History, Family History, Medications, and allergies have been reviewed in [facility], and have been updated if relevant.   Review of Systems See HPI    No CP No SOB No SI No HI No abdominal pain Objective:   Physical Exam BP 128/82  Pulse 67  Temp(Src) 97.9 F (36.6 C) (Oral)  Ht 4' 9.25" (1.454 m)  Wt 275 lb 8 oz (124.966 kg)  BMI 59.11 kg/m2  SpO2 93%  Wt Readings from Last 3 Encounters:  09/19/14 275 lb 8 oz (124.966 kg)  05-11-14 274 lb (124.286 kg)  10/13/13 285 lb 4 oz (129.389 kg)     General:  Morbidly obese, alert,appropriate and cooperative throughout examination Head:  normocephalic and atraumatic.   Eyes:  vision grossly intact, pupils equal, pupils round, and pupils reactive to light.   Ears:  R ear normal and L ear normal.   Nose:  no external deformity.   Mouth:  good dentition.   Neck:  No deformities, masses, or tenderness noted. Lungs:  Normal respiratory effort, chest expands symmetrically. Lungs are clear to auscultation, no crackles or wheezes. Heart:  Normal rate and regular rhythm. S1 and S2 normal without gallop, murmur, click, rub or other extra sounds. Abdomen:  Bowel sounds positive,abdomen soft and non-tender without masses, organomegaly or hernias noted. Msk:  No deformity or scoliosis noted of thoracic or lumbar spine.    Extremities:  No clubbing, cyanosis, edema, or deformity noted with normal full range of motion of all joints, SLR neg bilaterally, pos lumbar paraspinous spasm.   Neurologic:  alert & oriented X3  Skin:  Intact without suspicious lesions or rashes Dry flaking areas on her scalp, not raised Cervical Nodes:  No lymphadenopathy noted Psych:  Tearful but appropriate Assessment & Plan:

## 2014-10-01 ENCOUNTER — Other Ambulatory Visit: Payer: Self-pay | Admitting: Family Medicine

## 2014-10-01 DIAGNOSIS — N6310 Unspecified lump in the right breast, unspecified quadrant: Secondary | ICD-10-CM

## 2014-10-12 ENCOUNTER — Other Ambulatory Visit: Payer: Self-pay

## 2014-11-26 ENCOUNTER — Other Ambulatory Visit: Payer: Self-pay | Admitting: *Deleted

## 2014-11-26 MED ORDER — DULOXETINE HCL 60 MG PO CPEP: ORAL_CAPSULE | ORAL | Status: DC

## 2014-12-06 ENCOUNTER — Other Ambulatory Visit: Payer: Self-pay | Admitting: Family Medicine

## 2014-12-06 DIAGNOSIS — N6310 Unspecified lump in the right breast, unspecified quadrant: Secondary | ICD-10-CM

## 2014-12-11 ENCOUNTER — Ambulatory Visit (INDEPENDENT_AMBULATORY_CARE_PROVIDER_SITE_OTHER): Payer: BC Managed Care – PPO | Admitting: Family Medicine

## 2014-12-11 ENCOUNTER — Encounter: Payer: Self-pay | Admitting: Family Medicine

## 2014-12-11 VITALS — BP 142/80 | HR 70 | Temp 98.0°F | Wt 282.8 lb

## 2014-12-11 DIAGNOSIS — L989 Disorder of the skin and subcutaneous tissue, unspecified: Secondary | ICD-10-CM | POA: Insufficient documentation

## 2014-12-11 DIAGNOSIS — F32A Depression, unspecified: Secondary | ICD-10-CM

## 2014-12-11 DIAGNOSIS — N950 Postmenopausal bleeding: Secondary | ICD-10-CM | POA: Insufficient documentation

## 2014-12-11 DIAGNOSIS — F4321 Adjustment disorder with depressed mood: Secondary | ICD-10-CM

## 2014-12-11 DIAGNOSIS — F419 Anxiety disorder, unspecified: Secondary | ICD-10-CM

## 2014-12-11 DIAGNOSIS — F329 Major depressive disorder, single episode, unspecified: Secondary | ICD-10-CM

## 2014-12-11 DIAGNOSIS — F418 Other specified anxiety disorders: Secondary | ICD-10-CM

## 2014-12-11 DIAGNOSIS — R21 Rash and other nonspecific skin eruption: Secondary | ICD-10-CM

## 2014-12-11 MED ORDER — ALPRAZOLAM 0.5 MG PO TABS: 0.5000 mg | ORAL_TABLET | Freq: Every evening | ORAL | Status: DC | PRN

## 2014-12-11 MED ORDER — TERCONAZOLE 0.4 % VA CREA: 1.0000 | TOPICAL_CREAM | Freq: Every day | VAGINAL | Status: DC

## 2014-12-11 NOTE — Patient Instructions (Signed)
Good to see you. Hang in there- I know this a rought time.  Please stop by to see Rosaria Ferries on your way.

## 2014-12-11 NOTE — Addendum Note (Signed)
Addended by: Lucille Passy on: 12/11/2014 07:49 AM   Modules accepted: Orders

## 2014-12-11 NOTE — Assessment & Plan Note (Addendum)
Symptoms deteriorated but well within normal symptoms of grief. Offered my support and referral to grief counseling. She declined. Did request xanax refill for short period of time due to worsening symptoms around the holidays.

## 2014-12-11 NOTE — Progress Notes (Signed)
Pre visit review using our clinic review tool, if applicable. No additional management support is needed unless otherwise documented below in the visit note. 

## 2014-12-11 NOTE — Assessment & Plan Note (Signed)
New- consistent with yeast. Treat with topical terazol. Call or return to clinic prn if these symptoms worsen or fail to improve as anticipated. The patient indicates understanding of these issues and agrees with the plan.

## 2014-12-11 NOTE — Assessment & Plan Note (Signed)
New- refer to GYN for endometrial biopsy. The patient indicates understanding of these issues and agrees with the plan.

## 2014-12-11 NOTE — Progress Notes (Signed)
Subjective:   Patient ID: Joan Russo, female    DOB: [birth year], 58 y.o.   MRN: [id number]  Joan Russo is a pleasant 58 y.o. year old female who presents to clinic today with Vaginal Bleeding and grief  on 12/11/2014  HPI: Postmenopausal bleeding- LMP was 10 years ago. 1 week ago, started having vaginal bleeding- light but "more than spotting," having to wear a pad. No cramping but does complain of lethargy and cramping. 5 years ago, had same type of event and was told "it was stress."  Has been having a difficult time.  This is her first Christmas without her husband who died 27 months.  His birthday and their anniversary is right after Christmas. Has been more sad and tearful.  Has been eating "everything in sight."  Denies SI or HI- "I have my girls and my faith." Wt Readings from Last 3 Encounters:  12/11/14 282 lb 12 oz (128.255 kg)  09/19/14 275 lb 8 oz (124.966 kg)  04/27/14 274 lb (124.286 kg)   Vulvular rash- very itchy and "raw."  Has not noticed any new vaginal discharge.  Current Outpatient Prescriptions on File Prior to Visit  Medication Sig Dispense Refill  . ALPRAZolam (XANAX) 0.5 MG tablet Take 1 tablet (0.5 mg total) by mouth at bedtime as needed for anxiety. 60 tablet 0  . diclofenac (VOLTAREN) 75 MG EC tablet TAKE ONE TABLET BY MOUTH TWICE DAILY 60 tablet 5  . DULoxetine (CYMBALTA) 60 MG capsule TAKE 1 CAPSULE (60 MG TOTAL) BY MOUTH DAILY. 30 capsule 2  . pantoprazole (PROTONIX) 40 MG tablet TAKE 1 TABLET (40 MG TOTAL) BY MOUTH DAILY. 30 tablet 5   No current facility-administered medications on file prior to visit.    No Known Allergies  Past Medical History  Diagnosis Date  . Obesity, Class III, BMI 40-49.9 (morbid obesity)   . Anxiety and depression   . Vitamin D deficiency   . Urinary incontinence   . OA (osteoarthritis)   . Anxiety   . Depression   . Osteopenia     Past Surgical History  Procedure Laterality Date  . Total knee  arthroplasty  08/2008    Left    Family History  Problem Relation Age of Onset  . Cancer Mother 64    breast  . Cancer Daughter 42    breast  . Colon cancer Neg Hx   . Esophageal cancer Neg Hx   . Rectal cancer Neg Hx   . Stomach cancer Neg Hx     History   Social History  . Marital Status: Married    Spouse Name: N/A    Number of Children: N/A  . Years of Education: N/A   Occupational History  . Not on file.   Social History Main Topics  . Smoking status: Never Smoker   . Smokeless tobacco: Never Used  . Alcohol Use: No  . Drug Use: No  . Sexual Activity: Not on file   Other Topics Concern  . Not on file   Social History Narrative   The PMH, PSH, Social History, Family History, Medications, and allergies have been reviewed in [facility], and have been updated if relevant.   Review of Systems  Constitutional: Negative.   HENT: Negative.   Respiratory: Positive for shortness of breath.   Cardiovascular: Negative.   Gastrointestinal: Positive for abdominal distention. Negative for nausea, vomiting, abdominal pain, diarrhea, blood in stool, anal bleeding and rectal pain.  Genitourinary: Positive for  vaginal bleeding. Negative for dysuria, urgency, difficulty urinating, genital sores, pelvic pain and dyspareunia.  Neurological: Negative.   Psychiatric/Behavioral: Positive for sleep disturbance. Negative for suicidal ideas, behavioral problems, self-injury and agitation. The patient is nervous/anxious.       See HPI Objective:    BP 142/80 mmHg  Pulse 70  Temp(Src) 98 F (36.7 C) (Oral)  Wt 282 lb 12 oz (128.255 kg)  SpO2 98%   Physical Exam  Constitutional: Vital signs are normal. No distress.  Morbidly obese  HENT:  Head: Normocephalic.  Cardiovascular: Normal rate and regular rhythm.   Pulmonary/Chest: Effort normal and breath sounds normal.  Genitourinary:    There is rash on the right labia. There is no tenderness, lesion or injury on the right labia.  There is rash on the left labia. There is no tenderness, lesion or injury on the left labia.  Skin: Skin is warm and dry.  Psychiatric:  Tearful, appropriate  Nursing note and vitals reviewed.         Assessment & Plan:   Postmenopausal vaginal bleeding  Grief  Anxiety and depression  Rash and nonspecific skin eruption No Follow-up on file.

## 2014-12-19 ENCOUNTER — Other Ambulatory Visit: Payer: BC Managed Care – PPO

## 2014-12-24 ENCOUNTER — Ambulatory Visit (INDEPENDENT_AMBULATORY_CARE_PROVIDER_SITE_OTHER): Payer: BC Managed Care – PPO | Admitting: Family Medicine

## 2014-12-24 ENCOUNTER — Other Ambulatory Visit: Payer: Self-pay | Admitting: Family Medicine

## 2014-12-24 ENCOUNTER — Encounter: Payer: Self-pay | Admitting: Family Medicine

## 2014-12-24 VITALS — BP 159/80 | HR 67 | Wt 245.0 lb

## 2014-12-24 DIAGNOSIS — N950 Postmenopausal bleeding: Secondary | ICD-10-CM | POA: Diagnosis not present

## 2014-12-24 DIAGNOSIS — N904 Leukoplakia of vulva: Secondary | ICD-10-CM | POA: Diagnosis not present

## 2014-12-24 DIAGNOSIS — M8580 Other specified disorders of bone density and structure, unspecified site: Secondary | ICD-10-CM | POA: Diagnosis not present

## 2014-12-24 LAB — CBC
HEMATOCRIT: 43.4 % (ref 36.0–46.0)
Hemoglobin: 14 g/dL (ref 12.0–15.0)
MCH: 28.6 pg (ref 26.0–34.0)
MCHC: 32.3 g/dL (ref 30.0–36.0)
MCV: 88.6 fL (ref 78.0–100.0)
MPV: 9.8 fL (ref 9.4–12.4)
Platelets: 329 10*3/uL (ref 150–400)
RBC: 4.9 MIL/uL (ref 3.87–5.11)
RDW: 14.7 % (ref 11.5–15.5)
WBC: 10.6 10*3/uL — ABNORMAL HIGH (ref 4.0–10.5)

## 2014-12-24 MED ORDER — CLOBETASOL PROPIONATE 0.05 % EX CREA: 1.0000 "application " | TOPICAL_CREAM | Freq: Two times a day (BID) | CUTANEOUS | Status: DC

## 2014-12-24 NOTE — Assessment & Plan Note (Signed)
Topical temovate bid x 6 wk, then daily x 6 wks, then 2x/wk.--

## 2014-12-24 NOTE — Patient Instructions (Signed)
Osteoporosis Throughout your life, your body breaks down old bone and replaces it with new bone. As you get older, your body does not replace bone as quickly as it breaks it down. By the age of 30 years, most people begin to gradually lose bone because of the imbalance between bone loss and replacement. Some people lose more bone than others. Bone loss beyond a specified normal degree is considered osteoporosis.  Osteoporosis affects the strength and durability of your bones. The inside of the ends of your bones and your flat bones, like the bones of your pelvis, look like honeycomb, filled with tiny open spaces. As bone loss occurs, your bones become less dense. This means that the open spaces inside your bones become bigger and the walls between these spaces become thinner. This makes your bones weaker. Bones of a person with osteoporosis can become so weak that they can break (fracture) during minor accidents, such as a simple fall. CAUSES  The following factors have been associated with the development of osteoporosis:  Smoking.  Drinking more than 2 alcoholic drinks several days per week.  Long-term use of certain medicines:  Corticosteroids.  Chemotherapy medicines.  Thyroid medicines.  Antiepileptic medicines.  Gonadal hormone suppression medicine.  Immunosuppression medicine.  Being underweight.  Lack of physical activity.  Lack of exposure to the sun. This can lead to vitamin D deficiency.  Certain medical conditions:  Certain inflammatory bowel diseases, such as Crohn disease and ulcerative colitis.  Diabetes.  Hyperthyroidism.  Hyperparathyroidism. RISK FACTORS Anyone can develop osteoporosis. However, the following factors can increase your risk of developing osteoporosis:  Gender--Women are at higher risk than men.  Age--Being older than 50 years increases your risk.  Ethnicity--White and Asian people have an increased risk.  Weight --Being extremely  underweight can increase your risk of osteoporosis.  Family history of osteoporosis--Having a family member who has developed osteoporosis can increase your risk. SYMPTOMS  Usually, people with osteoporosis have no symptoms.  DIAGNOSIS  Signs during a physical exam that may prompt your caregiver to suspect osteoporosis include:  Decreased height. This is usually caused by the compression of the bones that form your spine (vertebrae) because they have weakened and become fractured.  A curving or rounding of the upper back (kyphosis). To confirm signs of osteoporosis, your caregiver may request a procedure that uses 2 low-dose X-ray beams with different levels of energy to measure your bone mineral density (dual-energy X-ray absorptiometry [DXA]). Also, your caregiver may check your level of vitamin D. TREATMENT  The goal of osteoporosis treatment is to strengthen bones in order to decrease the risk of bone fractures. There are different types of medicines available to help achieve this goal. Some of these medicines work by slowing the processes of bone loss. Some medicines work by increasing bone density. Treatment also involves making sure that your levels of calcium and vitamin D are adequate. PREVENTION  There are things you can do to help prevent osteoporosis. Adequate intake of calcium and vitamin D can help you achieve optimal bone mineral density. Regular exercise can also help, especially resistance and weight-bearing activities. If you smoke, quitting smoking is an important part of osteoporosis prevention. MAKE SURE YOU:  Understand these instructions.  Will watch your condition.  Will get help right away if you are not doing well or get worse. FOR MORE INFORMATION www.osteo.org and www.nof.org Document Released: 09/23/2005 Document Revised: 04/10/2013 Document Reviewed: 11/28/2011 [employer] Patient Information 2015 [employer]. This information is not   intended to replace advice  given to you by your health care provider. Make sure you discuss any questions you have with your health care provider. Postmenopausal Bleeding Postmenopausal bleeding is any bleeding a woman has after she has entered into menopause. Menopause is the end of a woman's fertile years. After menopause, a woman no longer ovulates or has menstrual periods.  Postmenopausal bleeding can be caused by various things. Any type of postmenopausal bleeding, even if it appears to be a typical menstrual period, is concerning. This should be evaluated by your health care provider. Any treatment will depend on the cause of the bleeding. HOME CARE INSTRUCTIONS Monitor your condition for any changes. The following actions may help to alleviate any discomfort you are experiencing:  Avoid the use of tampons and douches as directed by your health care provider.  Change your pads frequently.  Get regular pelvic exams and Pap tests.  Keep all follow-up appointments for diagnostic tests as directed by your health care provider. SEEK MEDICAL CARE IF:   Your bleeding lasts more than 1 week.  You have abdominal pain.  You have bleeding with sexual intercourse. SEEK IMMEDIATE MEDICAL CARE IF:   You have a fever, chills, headache, dizziness, muscle aches, and bleeding.  You have severe pain with bleeding.  You are passing blood clots.  You have bleeding and need more than 1 pad an hour.  You feel faint. MAKE SURE YOU:  Understand these instructions.  Will watch your condition.  Will get help right away if you are not doing well or get worse. Document Released: 03/24/2006 Document Revised: 10/04/2013 Document Reviewed: 07/13/2013 [facility] Patient Information 2015 [place], [place]. This information is not intended to replace advice given to you by your health care provider. Make sure you discuss any questions you have with your health care provider. Lichen Sclerosus Lichen sclerosus is a skin problem. It  can happen on any part of the body, but it commonly involves the anal or genital areas. Lichen sclerosus is not an infection or a fungus. Girls and women are more commonly affected than boys and men. CAUSES The cause is not known. It could be the result of an overactive immune system or a lack of certain hormones. Lichen sclerosus is not passed from one person to another (not contagious). SYMPTOMS Your skin may have:  Thin, wrinkled, white areas.  Thickened white areas.  Red and swollen patches.  Tears or cracks.  Bruising.  Blood blisters.  Severe itching. You may also have pain, itching, or burning with urination. Constipation is also common in people with lichen sclerosus. DIAGNOSIS Your caregiver will do a physical exam. Sometimes, a tissue sample (biopsy) may be sent for testing. TREATMENT Treatment may involve putting a thin layer of medicated cream (topical steroid) over the areas with lichen sclerosus. Use the cream only as directed by your caregiver.  HOME CARE INSTRUCTIONS  Only take over-the-counter or prescription medicines as directed by your caregiver.  Keep the vaginal area as clean and dry as possible. SEEK MEDICAL CARE IF: You develop increasing pain, swelling, or redness. Document Released: 05/06/2011 Document Revised: 03/07/2012 Document Reviewed: 05/06/2011 [facility] Patient Information 2015 [place], [place]. This information is not intended to replace advice given to you by your health care provider. Make sure you discuss any questions you have with your health care provider.

## 2014-12-24 NOTE — Progress Notes (Signed)
    Subjective:    Patient ID: Joan Russo is a 58 y.o. female presenting with post menopausal bleeding and Vaginal Itching  on 12/24/2014  HPI: Reports 4 day cycle in 12/15.  Previously menopausal at 23.  Had episode of bleeding at 74 with negative w/u. Reports vaginal itching, unresponsive to topical anti-fungal. Previously on Kenalog for this. Reports h/o osteopenia, self-stopped her Fosamax, since it upset her stomach. Last bone density was in 2009.  Review of Systems  Constitutional: Negative for fever, chills and unexpected weight change.  Respiratory: Negative for shortness of breath.   Cardiovascular: Negative for chest pain.  Gastrointestinal: Positive for abdominal pain (bloating). Negative for nausea and vomiting.  Genitourinary: Negative for dysuria.  Skin: Negative for rash.      Objective:    BP 159/80 mmHg  Pulse 67  Wt 245 lb (111.131 kg) Physical Exam  Constitutional: She is oriented to person, place, and time. She appears well-developed and well-nourished. No distress.  obese  HENT:  Head: Normocephalic and atraumatic.  Eyes: No scleral icterus.  Neck: Neck supple.  Cardiovascular: Normal rate.   Pulmonary/Chest: Effort normal.  Abdominal: Soft.  Genitourinary: There is rash on the right labia. There is rash on the left labia.  Thinning, pallor, white plaques with loss of normal architecture, c/w lichen sclerosus   Neurological: She is alert and oriented to person, place, and time.  Skin: Skin is warm and dry.  Psychiatric: She has a normal mood and affect.   Procedure: Patient given informed consent, signed copy in the chart, time out was performed. Appropriate time out taken. . The patient was placed in the lithotomy position and the cervix brought into view with sterile speculum.  Portio of cervix cleansed x 2 with betadine swabs.  A tenaculum was placed in the anterior lip of the cervix.  The uterus was sounded for depth of 10 cm. A pipelle was  introduced to into the uterus, suction created,  and an endometrial sample was obtained. All equipment was removed and accounted for.  The patient tolerated the procedure well.    Patient given post procedure instructions. The patient will return in 2 weeks for results.       Assessment & Plan:   Problem List Items Addressed This Visit      Unprioritized   Postmenopausal vaginal bleeding    Worrisome for endometrial cancer--will check endo biopsy, CBC, TSH, and pelvic sono.    Relevant Orders      Surgical pathology      US Pelvis Complete      US Transvaginal Non-OB      TSH      CBC   Osteopenia - Primary    Will leave further w/u and treatment to PCP.    Lichen sclerosus of female genitalia    Topical temovate bid x 6 wk, then daily x 6 wks, then 2x/wk.--    Relevant Medications      clobetasol cream (TEMOVATE) 0.05 %      Return in about 4 weeks (around 01/21/2015).

## 2014-12-24 NOTE — Assessment & Plan Note (Signed)
Will leave further w/u and treatment to PCP.

## 2014-12-24 NOTE — Assessment & Plan Note (Signed)
Worrisome for endometrial cancer--will check endo biopsy, CBC, TSH, and pelvic sono.

## 2014-12-25 ENCOUNTER — Telehealth: Payer: Self-pay | Admitting: *Deleted

## 2014-12-25 LAB — T3, FREE: T3, Free: 2.8 pg/mL (ref 2.3–4.2)

## 2014-12-25 LAB — T4, FREE: FREE T4: 1.05 ng/dL (ref 0.80–1.80)

## 2014-12-25 LAB — TSH: TSH: 6.178 u[IU]/mL — AB (ref 0.350–4.500)

## 2014-12-25 NOTE — Telephone Encounter (Signed)
I called [facility] and added on a Free T3 AND Free T4.

## 2014-12-25 NOTE — Telephone Encounter (Signed)
-----   Message from Donnamae Jude, MD sent at 12/25/2014  7:16 AM EST ----- Please add Free T 3 and Free T 4 to labs from yesterday

## 2015-01-01 ENCOUNTER — Encounter: Payer: Self-pay | Admitting: Internal Medicine

## 2015-01-02 ENCOUNTER — Ambulatory Visit (HOSPITAL_COMMUNITY)
Admission: RE | Admit: 2015-01-02 | Discharge: 2015-01-02 | Disposition: A | Discharge status: Home / Self Care | Payer: BLUE CROSS/BLUE SHIELD | Source: Ambulatory Visit | Attending: Family Medicine | Admitting: Family Medicine

## 2015-01-02 DIAGNOSIS — N950 Postmenopausal bleeding: Secondary | ICD-10-CM | POA: Insufficient documentation

## 2015-01-08 ENCOUNTER — Encounter: Payer: Self-pay | Admitting: Family Medicine

## 2015-01-08 ENCOUNTER — Ambulatory Visit (INDEPENDENT_AMBULATORY_CARE_PROVIDER_SITE_OTHER): Payer: BLUE CROSS/BLUE SHIELD | Admitting: Family Medicine

## 2015-01-08 VITALS — BP 123/88 | HR 75 | Ht <= 58 in | Wt 278.0 lb

## 2015-01-08 DIAGNOSIS — E038 Other specified hypothyroidism: Secondary | ICD-10-CM

## 2015-01-08 DIAGNOSIS — E039 Hypothyroidism, unspecified: Secondary | ICD-10-CM | POA: Insufficient documentation

## 2015-01-08 DIAGNOSIS — N950 Postmenopausal bleeding: Secondary | ICD-10-CM

## 2015-01-08 NOTE — Patient Instructions (Signed)
Dilation and Curettage or Vacuum Curettage, Care After  Refer to this sheet in the next few weeks. These instructions provide you with information on caring for yourself after your procedure. Your health care provider may also give you more specific instructions. Your treatment has been planned according to current medical practices, but problems sometimes occur. Call your health care provider if you have any problems or questions after your procedure.  WHAT TO EXPECT AFTER THE PROCEDURE  After your procedure, it is typical to have light cramping and bleeding. This may last for 2 days to 2 weeks after the procedure.  HOME CARE INSTRUCTIONS   · Do not drive for 24 hours.  · Wait 1 week before returning to strenuous activities.  · Take your temperature 2 times a day for 4 days and write it down. Provide these temperatures to your health care provider if you develop a fever.  · Avoid long periods of standing.  · Avoid heavy lifting, pushing, or pulling. Do not lift anything heavier than 10 pounds (4.5 kg).  · Limit stair climbing to once or twice a day.  · Take rest periods often.  · You may resume your usual diet.  · Drink enough fluids to keep your urine clear or pale yellow.  · Your usual bowel function should return. If you have constipation, you may:  ¨ Take a mild laxative with permission from your health care provider.  ¨ Add fruit and bran to your diet.  ¨ Drink more fluids.  · Take showers instead of baths until your health care provider gives you permission to take baths.  · Do not go swimming or use a hot tub until your health care provider approves.  · Try to have someone with you or available to you the first 24-48 hours, especially if you were given a general anesthetic.  · Do not douche, use tampons, or have sex (intercourse) for 2 weeks after the procedure.  · Only take over-the-counter or prescription medicines as directed by your health care provider. Do not take aspirin. It can cause  bleeding.  · Follow up with your health care provider as directed.  SEEK MEDICAL CARE IF:   · You have increasing cramps or pain that is not relieved with medicine.  · You have abdominal pain that does not seem to be related to the same area of earlier cramping and pain.  · You have bad smelling vaginal discharge.  · You have a rash.  · You are having problems with any medicine.  SEEK IMMEDIATE MEDICAL CARE IF:   · You have bleeding that is heavier than a normal menstrual period.  · You have a fever.  · You have chest pain.  · You have shortness of breath.  · You feel dizzy or feel like fainting.  · You pass out.  · You have pain in your shoulder strap area.  · You have heavy vaginal bleeding with or without blood clots.  MAKE SURE YOU:   · Understand these instructions.  · Will watch your condition.  · Will get help right away if you are not doing well or get worse.  Document Released: 12/11/2000 Document Revised: 12/19/2013 Document Reviewed: 07/13/2013  [employer]® Patient Information ©2015 [employer]. This information is not intended to replace advice given to you by your health care provider. Make sure you discuss any questions you have with your health care provider.

## 2015-01-08 NOTE — Assessment & Plan Note (Signed)
Will need f/u in several months

## 2015-01-08 NOTE — Assessment & Plan Note (Signed)
Will book for D & C with hysteroscopy, which may be both therapeutic and diagnostic. Risks include but are not limited to bleeding and perforation of uterus. Likelihood of success is high.

## 2015-01-08 NOTE — Addendum Note (Signed)
Addended by: Donnamae Jude on: 01/08/2015 03:59 PM   Modules accepted: Level of Service

## 2015-01-08 NOTE — Progress Notes (Addendum)
    Subjective:    Patient ID: Joan Russo is a 59 y.o. female presenting with Results  on 01/08/2015  HPI: Here for f/u PMB.  Has had some further bleeding.  EMB showed polyps only. U/s shows endometrial thickness of 10 mm,  Subclinical hypothyroid.  Review of Systems  Constitutional: Negative for fever and chills.  Respiratory: Negative for shortness of breath.   Cardiovascular: Negative for chest pain.  Gastrointestinal: Negative for nausea, vomiting and abdominal pain.  Genitourinary: Negative for dysuria.  Skin: Negative for rash.      Objective:    BP 123/88 mmHg  Pulse 75  Ht 4' 9.5" (1.461 m)  Wt 278 lb (126.1 kg)  BMI 59.08 kg/m2 Physical Exam  Constitutional: She is oriented to person, place, and time. She appears well-developed and well-nourished. No distress.  HENT:  Head: Normocephalic and atraumatic.  Eyes: No scleral icterus.  Neck: Neck supple.  Cardiovascular: Normal rate.   Pulmonary/Chest: Effort normal.  Abdominal: Soft.  Neurological: She is alert and oriented to person, place, and time.  Skin: Skin is warm and dry.  Psychiatric: She has a normal mood and affect.        Assessment & Plan:   Problem List Items Addressed This Visit      Unprioritized   Postmenopausal vaginal bleeding - Primary    Will book for D & C with hysteroscopy, which may be both therapeutic and diagnostic. Risks include but are not limited to bleeding and perforation of uterus. Likelihood of success is high.     Subclinical hypothyroidism    Will need f/u in several months       Return in about 6 weeks (around 02/19/2015) for postop check.

## 2015-01-10 NOTE — H&P (Signed)
Joan Russo is an 59 y.o. G2P2 Unknown female.   Chief Complaint: Postmenopausal bleeding HPI: Reports 4 day cycle in 12/15. Previously menopausal at 83. Had episode of bleeding at 31 with negative w/u. EMB showed polyps only. U/s shows endometrial thickness of 10 mm  Past Medical History  Diagnosis Date  . Obesity, Class III, BMI 40-49.9 (morbid obesity)   . Anxiety and depression   . Vitamin D deficiency   . Urinary incontinence   . OA (osteoarthritis)   . Anxiety   . Depression   . Osteopenia     Past Surgical History  Procedure Laterality Date  . 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 Cancer Mother 34    breast  . Cancer Daughter 60    breast  . Colon cancer Neg Hx   . Esophageal cancer Neg Hx   . Rectal cancer Neg Hx   . Stomach cancer Neg Hx    Social History:  reports that she has never smoked. She has never used smokeless tobacco. She reports that she does not drink alcohol or use illicit drugs.  Allergies: No Known Allergies  No current facility-administered medications on file prior to encounter.   Current Outpatient Prescriptions on File Prior to Encounter  Medication Sig Dispense Refill  . ALPRAZolam (XANAX) 0.5 MG tablet Take 1 tablet (0.5 mg total) by mouth at bedtime as needed for anxiety. 30 tablet 0  . clobetasol cream (TEMOVATE) 6.43 % Apply 1 application topically 2 (two) times daily. X 6 wks, then drop to once daily x 6 wks, then 2x/wk 30 g 5  . diclofenac (VOLTAREN) 75 MG EC tablet TAKE ONE TABLET BY MOUTH TWICE DAILY (Patient taking differently: Take 75 mg by mouth 2 (two) times daily. TAKE ONE TABLET BY MOUTH TWICE DAILY) 60 tablet 5  . DULoxetine (CYMBALTA) 60 MG capsule TAKE 1 CAPSULE (60 MG TOTAL) BY MOUTH DAILY. (Patient taking differently: Take 60 mg by mouth daily. TAKE 1 CAPSULE (60 MG TOTAL) BY MOUTH DAILY.) 30 capsule 2  . pantoprazole (PROTONIX) 40 MG tablet TAKE 1 TABLET (40  MG TOTAL) BY MOUTH DAILY. (Patient taking differently: Take 40 mg by mouth daily. TAKE 1 TABLET (40 MG TOTAL) BY MOUTH DAILY.) 30 tablet 5  . terconazole (TERAZOL 7) 0.4 % vaginal cream Place 1 applicator vaginally at bedtime. (Patient not taking: Reported on 01/09/2015) 45 g 0    Pertinent items are noted in HPI.  There were no vitals taken for this visit. There were no vitals taken for this visit. General appearance: alert, cooperative, appears stated age and moderately obese Neck: supple, symmetrical, trachea midline Lungs: normal effort Heart: regular rate and rhythm Abdomen: soft, non-tender; bowel sounds normal; no masses,  no organomegaly Skin: Skin color, texture, turgor normal. No rashes or lesions Neurologic: Grossly normal   Lab Results  Component Value Date   WBC 10.6* 12/24/2014   HGB 14.0 12/24/2014   HCT 43.4 12/24/2014   MCV 88.6 12/24/2014   PLT 329 12/24/2014   No results found for: PREGTESTUR, PREGSERUM, HCG, HCGQUANT   Assessment/Plan Principal Problem:   Postmenopausal vaginal bleeding Active Problems:   Obesity, Class III, BMI 40-49.9 (morbid obesity)   Lichen sclerosus of female genitalia   Subclinical hypothyroidism   D & C with hysteroscopy, which may be both therapeutic and diagnostic. Risks include but are not limited to bleeding and perforation of uterus.  Likelihood of success is high.  Mitzi Lilja S 01/10/2015, 3:27 PM

## 2015-01-11 ENCOUNTER — Other Ambulatory Visit: Payer: Self-pay

## 2015-01-11 ENCOUNTER — Encounter (HOSPITAL_COMMUNITY): Payer: Self-pay

## 2015-01-11 ENCOUNTER — Encounter (HOSPITAL_COMMUNITY)
Admission: RE | Admit: 2015-01-11 | Discharge: 2015-01-11 | Disposition: A | Discharge status: Home / Self Care | Payer: BLUE CROSS/BLUE SHIELD | Source: Ambulatory Visit | Attending: Family Medicine | Admitting: Family Medicine

## 2015-01-11 DIAGNOSIS — Z01818 Encounter for other preprocedural examination: Secondary | ICD-10-CM | POA: Diagnosis not present

## 2015-01-11 DIAGNOSIS — N950 Postmenopausal bleeding: Secondary | ICD-10-CM | POA: Insufficient documentation

## 2015-01-11 HISTORY — DX: Gastro-esophageal reflux disease without esophagitis: K21.9

## 2015-01-11 LAB — CBC
HEMATOCRIT: 42.7 % (ref 36.0–46.0)
HEMOGLOBIN: 13.9 g/dL (ref 12.0–15.0)
MCH: 29.6 pg (ref 26.0–34.0)
MCHC: 32.6 g/dL (ref 30.0–36.0)
MCV: 90.9 fL (ref 78.0–100.0)
PLATELETS: 279 10*3/uL (ref 150–400)
RBC: 4.7 MIL/uL (ref 3.87–5.11)
RDW: 14.9 % (ref 11.5–15.5)
WBC: 8.6 10*3/uL (ref 4.0–10.5)

## 2015-01-11 NOTE — Patient Instructions (Signed)
   Your procedure is scheduled on: JAN 20   Enter through the Main Entrance of [facility] at:930AM [place] up the phone at the desk and dial [phone] and inform us of your arrival.  Please call this number if you have any problems the morning of surgery: [phone]  Remember: Do not eat food after midnight: JAN 19 Do not drink clear liquids after: 7AM  Take these medicines the morning of surgery with a SIP OF WATER: TAKE PROTONIX AM OF SURGERY  Do not wear jewelry, make-up, or FINGER nail polish No metal in your hair or on your body. Do not wear lotions, powders, perfumes.  You may wear deodorant.  Do not bring valuables to the hospital. Contacts, dentures or bridgework may not be worn into surgery.  Leave suitcase in the car. After Surgery it may be brought to your room. For patients being admitted to the hospital, checkout time is 11:00am the day of discharge.    Patients discharged on the day of surgery will not be allowed to drive home.

## 2015-01-16 ENCOUNTER — Encounter (HOSPITAL_COMMUNITY): Payer: Self-pay | Admitting: *Deleted

## 2015-01-16 ENCOUNTER — Encounter (HOSPITAL_COMMUNITY)
Admission: RE | Disposition: A | Discharge status: Home / Self Care | Payer: Self-pay | Source: Ambulatory Visit | Attending: Family Medicine

## 2015-01-16 ENCOUNTER — Ambulatory Visit (HOSPITAL_COMMUNITY): Payer: BLUE CROSS/BLUE SHIELD | Admitting: Anesthesiology

## 2015-01-16 ENCOUNTER — Ambulatory Visit (HOSPITAL_COMMUNITY)
Admission: RE | Admit: 2015-01-16 | Discharge: 2015-01-16 | Disposition: A | Discharge status: Home / Self Care | Payer: BLUE CROSS/BLUE SHIELD | Source: Ambulatory Visit | Attending: Family Medicine | Admitting: Family Medicine

## 2015-01-16 DIAGNOSIS — Z6841 Body Mass Index (BMI) 40.0 and over, adult: Secondary | ICD-10-CM | POA: Diagnosis present

## 2015-01-16 DIAGNOSIS — E039 Hypothyroidism, unspecified: Secondary | ICD-10-CM | POA: Diagnosis not present

## 2015-01-16 DIAGNOSIS — K219 Gastro-esophageal reflux disease without esophagitis: Secondary | ICD-10-CM | POA: Insufficient documentation

## 2015-01-16 DIAGNOSIS — M1990 Unspecified osteoarthritis, unspecified site: Secondary | ICD-10-CM | POA: Diagnosis not present

## 2015-01-16 DIAGNOSIS — Z79899 Other long term (current) drug therapy: Secondary | ICD-10-CM | POA: Diagnosis not present

## 2015-01-16 DIAGNOSIS — N950 Postmenopausal bleeding: Secondary | ICD-10-CM | POA: Diagnosis present

## 2015-01-16 DIAGNOSIS — F329 Major depressive disorder, single episode, unspecified: Secondary | ICD-10-CM | POA: Diagnosis not present

## 2015-01-16 DIAGNOSIS — N904 Leukoplakia of vulva: Secondary | ICD-10-CM | POA: Diagnosis present

## 2015-01-16 HISTORY — PX: HYSTEROSCOPY WITH D & C: SHX1775

## 2015-01-16 SURGERY — DILATATION AND CURETTAGE /HYSTEROSCOPY
Anesthesia: General | Site: Vagina

## 2015-01-16 MED ORDER — BUPIVACAINE HCL 0.25 % IJ SOLN
INTRAMUSCULAR | Status: DC | PRN
  Administered 2015-01-16: 20 mL

## 2015-01-16 MED ORDER — FENTANYL CITRATE 0.05 MG/ML IJ SOLN: 25.0000 ug | INTRAMUSCULAR | Status: DC | PRN

## 2015-01-16 MED ORDER — ONDANSETRON HCL 4 MG/2ML IJ SOLN
INTRAMUSCULAR | Status: DC | PRN
  Administered 2015-01-16: 4 mg via INTRAVENOUS

## 2015-01-16 MED ORDER — MEPERIDINE HCL 25 MG/ML IJ SOLN: 6.2500 mg | INTRAMUSCULAR | Status: DC | PRN

## 2015-01-16 MED ORDER — ONDANSETRON HCL 4 MG/2ML IJ SOLN
INTRAMUSCULAR | Status: AC
  Filled 2015-01-16: qty 2

## 2015-01-16 MED ORDER — PROPOFOL 10 MG/ML IV BOLUS
INTRAVENOUS | Status: DC | PRN
  Administered 2015-01-16: 180 mg via INTRAVENOUS

## 2015-01-16 MED ORDER — SCOPOLAMINE 1 MG/3DAYS TD PT72: 1.0000 | MEDICATED_PATCH | Freq: Once | TRANSDERMAL | Status: DC

## 2015-01-16 MED ORDER — GLYCINE 1.5 % IR SOLN
Status: DC | PRN
  Administered 2015-01-16: 3000 mL

## 2015-01-16 MED ORDER — MIDAZOLAM HCL 2 MG/2ML IJ SOLN
INTRAMUSCULAR | Status: DC | PRN
  Administered 2015-01-16: 1 mg via INTRAVENOUS

## 2015-01-16 MED ORDER — DEXAMETHASONE SODIUM PHOSPHATE 10 MG/ML IJ SOLN
INTRAMUSCULAR | Status: DC | PRN
  Administered 2015-01-16: 4 mg via INTRAVENOUS

## 2015-01-16 MED ORDER — SCOPOLAMINE 1 MG/3DAYS TD PT72
MEDICATED_PATCH | TRANSDERMAL | Status: DC
Start: 2015-01-16 — End: 2015-01-16
  Filled 2015-01-16: qty 1

## 2015-01-16 MED ORDER — LIDOCAINE HCL (CARDIAC) 20 MG/ML IV SOLN
INTRAVENOUS | Status: DC | PRN
  Administered 2015-01-16: 70 mg via INTRAVENOUS

## 2015-01-16 MED ORDER — LACTATED RINGERS IV SOLN: INTRAVENOUS | Status: DC

## 2015-01-16 MED ORDER — FENTANYL CITRATE 0.05 MG/ML IJ SOLN
INTRAMUSCULAR | Status: AC
  Filled 2015-01-16: qty 2

## 2015-01-16 MED ORDER — MIDAZOLAM HCL 2 MG/2ML IJ SOLN
INTRAMUSCULAR | Status: AC
  Filled 2015-01-16: qty 2

## 2015-01-16 MED ORDER — BUPIVACAINE HCL (PF) 0.25 % IJ SOLN
INTRAMUSCULAR | Status: AC
  Filled 2015-01-16: qty 30

## 2015-01-16 MED ORDER — METOCLOPRAMIDE HCL 5 MG/ML IJ SOLN: 10.0000 mg | Freq: Once | INTRAMUSCULAR | Status: DC | PRN

## 2015-01-16 MED ORDER — FENTANYL CITRATE 0.05 MG/ML IJ SOLN
INTRAMUSCULAR | Status: DC | PRN
  Administered 2015-01-16 (×2): 50 ug via INTRAVENOUS

## 2015-01-16 MED ORDER — LIDOCAINE HCL (CARDIAC) 20 MG/ML IV SOLN
INTRAVENOUS | Status: AC
  Filled 2015-01-16: qty 5

## 2015-01-16 MED ORDER — BUPIVACAINE-EPINEPHRINE (PF) 0.25% -1:200000 IJ SOLN
INTRAMUSCULAR | Status: AC
  Filled 2015-01-16: qty 30

## 2015-01-16 MED ORDER — DEXAMETHASONE SODIUM PHOSPHATE 4 MG/ML IJ SOLN
INTRAMUSCULAR | Status: AC
  Filled 2015-01-16: qty 1

## 2015-01-16 MED ORDER — LACTATED RINGERS IV SOLN
INTRAVENOUS | Status: DC
  Administered 2015-01-16: 10:00:00 via INTRAVENOUS

## 2015-01-16 MED ORDER — PROPOFOL 10 MG/ML IV BOLUS
INTRAVENOUS | Status: AC
  Filled 2015-01-16: qty 20

## 2015-01-16 SURGICAL SUPPLY — 17 items
CANISTER SUCT 3000ML (MISCELLANEOUS) ×2 IMPLANT
CATH ROBINSON RED A/P 16FR (CATHETERS) ×2 IMPLANT
CLOTH BEACON ORANGE TIMEOUT ST (SAFETY) ×2 IMPLANT
CONTAINER PREFILL 10% NBF 60ML (FORM) ×4 IMPLANT
ELECT REM PT RETURN 9FT ADLT (ELECTROSURGICAL)
ELECTRODE REM PT RTRN 9FT ADLT (ELECTROSURGICAL) IMPLANT
GLOVE BIOGEL PI IND STRL 7.0 (GLOVE) ×1 IMPLANT
GLOVE BIOGEL PI INDICATOR 7.0 (GLOVE) ×1
GLOVE ECLIPSE 7.0 STRL STRAW (GLOVE) ×4 IMPLANT
GOWN STRL REUS W/TWL LRG LVL3 (GOWN DISPOSABLE) ×6 IMPLANT
LOOP ANGLED CUTTING 22FR (CUTTING LOOP) IMPLANT
PACK VAGINAL MINOR WOMEN LF (CUSTOM PROCEDURE TRAY) ×2 IMPLANT
PAD OB MATERNITY 4.3X12.25 (PERSONAL CARE ITEMS) ×2 IMPLANT
TOWEL OR 17X24 6PK STRL BLUE (TOWEL DISPOSABLE) ×4 IMPLANT
TUBING AQUILEX INFLOW (TUBING) ×2 IMPLANT
TUBING AQUILEX OUTFLOW (TUBING) ×2 IMPLANT
WATER STERILE IRR 1000ML POUR (IV SOLUTION) ×2 IMPLANT

## 2015-01-16 NOTE — Transfer of Care (Signed)
Immediate Anesthesia Transfer of Care Note  Patient: Joan Russo  Procedure(s) Performed: Procedure(s): DILATATION AND CURETTAGE /HYSTEROSCOPY (N/A)  Patient Location: PACU  Anesthesia Type:General  Level of Consciousness: awake, alert , oriented and patient cooperative  Airway & Oxygen Therapy: Patient Spontanous Breathing and Patient connected to nasal cannula oxygen  Post-op Assessment: Report given to PACU RN and Post -op Vital signs reviewed and stable  Post vital signs: Reviewed and stable  Complications: No apparent anesthesia complications

## 2015-01-16 NOTE — Op Note (Signed)
Preoperative diagnoses:  Postmenopausal bleeding  Postoperative diagnosis: Same  Procedure: D & C, hysteroscopy  Surgeon: Standley Dakins. Kennon Rounds, MD  Anesthesia: MAC-Dr. Royce Macadamia  Findings: small fundal polyp noted, otherwise normal appearing cavity  Fluid deficit: 98  Estimated blood loss: Minimum  Pathology: Endometrial curettings  Indications for procedure: 59 y.o. G2P2 with history of postmenopausal bleeding who presents for diagnostic completion of workup and therapeutic benefits of D & C. S/p EMB in office, negative, and pelvic sono-that showed some thickening.  Procedure: The patient was taken to the operating room where spinal analgesia was inserted. SCDs were in place.  Time out was performed. Patient was placed in dorsolithotomy in [place]. She was prepped and draped in the usual sterile fashion. A Red Rubber catheter was used to drain her bladder. A speculum was placeed in the vagina. The cervix was visualized anteriorly and grasped with a single-tooth tenaculum. Paracervical block was performed with 1% Marcaine plain with 20 cc injected. The uterus sounded to 7 cm. Sequential dilation was performed with Kennon Rounds dilators. The hysteroscope was inserted and the endometrial cavity and inspected. There were the above findings noted in the endometrial cavity with both ostia seen.  The hysteroscope was removed. Sharp curettage was performed in all 4 quadrants. Little tissue obtained. Hysteroscope re-introduced and showed that the polyp was no longer present.  All instruments were removed from the vagina. All instrument, needle and lap counts were correct x2. The patient was awakened and is recovering in stable condition.  PRATT,TANYA S MD 01/16/2015 10:31 AM

## 2015-01-16 NOTE — Discharge Instructions (Signed)
Please use your Diclofenac for cramping if needed over the next 24-48 hours. Hysteroscopy Hysteroscopy is a procedure used for looking inside the womb (uterus). It may be done for various reasons, including:  To evaluate abnormal bleeding, fibroid (benign, noncancerous) tumors, polyps, scar tissue (adhesions), and possibly cancer of the uterus.  To look for lumps (tumors) and other uterine growths.  To look for causes of why a woman cannot get pregnant (infertility), causes of recurrent loss of pregnancy (miscarriages), or a lost intrauterine device (IUD).  To perform a sterilization by blocking the fallopian tubes from inside the uterus. In this procedure, a thin, flexible tube with a tiny light and camera on the end of it (hysteroscope) is used to look inside the uterus. A hysteroscopy should be done right after a menstrual period to be sure you are not pregnant. LET [facility] CARE PROVIDER KNOW ABOUT:   Any allergies you have.  All medicines you are taking, including vitamins, herbs, eye drops, creams, and over-the-counter medicines.  Previous problems you or members of your family have had with the use of anesthetics.  Any blood disorders you have.  Previous surgeries you have had.  Medical conditions you have. RISKS AND COMPLICATIONS  Generally, this is a safe procedure. However, as with any procedure, complications can occur. Possible complications include:  Putting a hole in the uterus.  Excessive bleeding.  Infection.  Damage to the cervix.  Injury to other organs.  Allergic reaction to medicines.  Too much fluid used in the uterus for the procedure. BEFORE THE PROCEDURE   Ask your health care provider about changing or stopping any regular medicines.  Do not take aspirin or blood thinners for 1 week before the procedure, or as directed by your health care provider. These can cause bleeding.  If you smoke, do not smoke for 2 weeks before the procedure.  In  some cases, a medicine is placed in the cervix the day before the procedure. This medicine makes the cervix have a larger opening (dilate). This makes it easier for the instrument to be inserted into the uterus during the procedure.  Do not eat or drink anything for at least 8 hours before the surgery.  Arrange for someone to take you home after the procedure. PROCEDURE   You may be given a medicine to relax you (sedative). You may also be given one of the following:  A medicine that numbs the area around the cervix (local anesthetic).  A medicine that makes you sleep through the procedure (general anesthetic).  The hysteroscope is inserted through the vagina into the uterus. The camera on the hysteroscope sends a picture to a TV screen. This gives the surgeon a good view inside the uterus.  During the procedure, air or a liquid is put into the uterus, which allows the surgeon to see better.  Sometimes, tissue is gently scraped from inside the uterus. These tissue samples are sent to a lab for testing. AFTER THE PROCEDURE   If you had a general anesthetic, you may be groggy for a couple hours after the procedure.  If you had a local anesthetic, you will be able to go home as soon as you are stable and feel ready.  You may have some cramping. This normally lasts for a couple days.  You may have bleeding, which varies from light spotting for a few days to menstrual-like bleeding for 3-7 days. This is normal.  If your test results are not back during the  visit, make an appointment with your health care provider to find out the results. Document Released: 03/22/2001 Document Revised: 10/04/2013 Document Reviewed: 07/13/2013 [facility] Patient Information 2015 [place], [place]. This information is not intended to replace advice given to you by your health care provider. Make sure you discuss any questions you have with your health care provider. Hypertension Hypertension, commonly called high  blood pressure, is when the force of blood pumping through your arteries is too strong. Your arteries are the blood vessels that carry blood from your heart throughout your body. A blood pressure reading consists of a higher number over a lower number, such as 110/72. The higher number (systolic) is the pressure inside your arteries when your heart pumps. The lower number (diastolic) is the pressure inside your arteries when your heart relaxes. Ideally you want your blood pressure below 120/80. Hypertension forces your heart to work harder to pump blood. Your arteries may become narrow or stiff. Having hypertension puts you at risk for heart disease, stroke, and other problems.  RISK FACTORS Some risk factors for high blood pressure are controllable. Others are not.  Risk factors you cannot control include:   Race. You may be at higher risk if you are African American.  Age. Risk increases with age.  Gender. Men are at higher risk than women before age 38 years. After age 47, women are at higher risk than men. Risk factors you can control include:  Not getting enough exercise or physical activity.  Being overweight.  Getting too much fat, sugar, calories, or salt in your diet.  Drinking too much alcohol. SIGNS AND SYMPTOMS Hypertension does not usually cause signs or symptoms. Extremely high blood pressure (hypertensive crisis) may cause headache, anxiety, shortness of breath, and nosebleed. DIAGNOSIS  To check if you have hypertension, your health care provider will measure your blood pressure while you are seated, with your arm held at the level of your heart. It should be measured at least twice using the same arm. Certain conditions can cause a difference in blood pressure between your right and left arms. A blood pressure reading that is higher than normal on one occasion does not mean that you need treatment. If one blood pressure reading is high, ask your health care provider about  having it checked again. TREATMENT  Treating high blood pressure includes making lifestyle changes and possibly taking medicine. Living a healthy lifestyle can help lower high blood pressure. You may need to change some of your habits. Lifestyle changes may include:  Following the DASH diet. This diet is high in fruits, vegetables, and whole grains. It is low in salt, red meat, and added sugars.  Getting at least 2 hours of brisk physical activity every week.  Losing weight if necessary.  Not smoking.  Limiting alcoholic beverages.  Learning ways to reduce stress. If lifestyle changes are not enough to get your blood pressure under control, your health care provider may prescribe medicine. You may need to take more than one. Work closely with your health care provider to understand the risks and benefits. HOME CARE INSTRUCTIONS  Have your blood pressure rechecked as directed by your health care provider.   Take medicines only as directed by your health care provider. Follow the directions carefully. Blood pressure medicines must be taken as prescribed. The medicine does not work as well when you skip doses. Skipping doses also puts you at risk for problems.   Do not smoke.   Monitor your blood pressure at  home as directed by your health care provider. SEEK MEDICAL CARE IF:   You think you are having a reaction to medicines taken.  You have recurrent headaches or feel dizzy.  You have swelling in your ankles.  You have trouble with your vision. SEEK IMMEDIATE MEDICAL CARE IF:  You develop a severe headache or confusion.  You have unusual weakness, numbness, or feel faint.  You have severe chest or abdominal pain.  You vomit repeatedly.  You have trouble breathing. MAKE SURE YOU:   Understand these instructions.  Will watch your condition.  Will get help right away if you are not doing well or get worse. Document Released: 12/14/2005 Document Revised:  04/30/2014 Document Reviewed: 10/06/2013 [facility] Patient Information 2015 [place], [place]. This information is not intended to replace advice given to you by your health care provider. Make sure you discuss any questions you have with your health care provider.  Post Anesthesia Home Care Instructions  Activity: Get plenty of rest for the remainder of the day. A responsible adult should stay with you for 24 hours following the procedure.  For the next 24 hours, DO NOT: -Drive a car -Paediatric nurse -Drink alcoholic beverages -Take any medication unless instructed by your physician -Make any legal decisions or sign important papers.  Meals: Start with liquid foods such as gelatin or soup. Progress to regular foods as tolerated. Avoid greasy, spicy, heavy foods. If nausea and/or vomiting occur, drink only clear liquids until the nausea and/or vomiting subsides. Call your physician if vomiting continues.  Special Instructions/Symptoms: Your throat may feel dry or sore from the anesthesia or the breathing tube placed in your throat during surgery. If this causes discomfort, gargle with warm salt water. The discomfort should disappear within 24 hours.   DISCHARGE INSTRUCTIONS: D&C / D&E The following instructions have been prepared to help you care for yourself upon your return home.   Personal hygiene:  Use sanitary pads for vaginal drainage, not tampons.  Shower the day after your procedure.  NO tub baths, pools or Jacuzzis for 2-3 weeks.  Wipe front to back after using the bathroom.  Activity and limitations:  Do NOT drive or operate any equipment for 24 hours. The effects of anesthesia are still present and drowsiness may result.  Do NOT rest in bed all day.  Walking is encouraged.  Walk up and down stairs slowly.  You may resume your normal activity in one to two days or as indicated by your physician.  Sexual activity: NO intercourse for at least 2 weeks after the  procedure, or as indicated by your physician.  Diet: Eat a light meal as desired this evening. You may resume your usual diet tomorrow.  Return to work: You may resume your work activities in one to two days or as indicated by your doctor.  What to expect after your surgery: Expect to have vaginal bleeding/discharge for 2-3 days and spotting for up to 10 days. It is not unusual to have soreness for up to 1-2 weeks. You may have a slight burning sensation when you urinate for the first day. Mild cramps may continue for a couple of days. You may have a regular period in 2-6 weeks.  Call your doctor for any of the following:  Excessive vaginal bleeding, saturating and changing one pad every hour.  Inability to urinate 6 hours after discharge from hospital.  Pain not relieved by pain medication.  Fever of 100.4 F or greater.  Unusual vaginal discharge  or odor.   Call for an appointment:    Patients signature: ______________________  Nurses signature ________________________  Support person's signature_______________________

## 2015-01-16 NOTE — Anesthesia Preprocedure Evaluation (Signed)
Anesthesia Evaluation  Patient identified by MRN, date of birth, ID band Patient awake    Reviewed: Allergy & Precautions, NPO status , Patient's Chart, lab work & pertinent test results  Airway Mallampati: III  TM Distance: >3 FB Neck ROM: Full    Dental no notable dental hx. (+) Teeth Intact   Pulmonary neg pulmonary ROS,  breath sounds clear to auscultation  Pulmonary exam normal       Cardiovascular negative cardio ROS  Rhythm:Regular Rate:Normal     Neuro/Psych PSYCHIATRIC DISORDERS Anxiety Depression negative neurological ROS  negative psych ROS   GI/Hepatic Neg liver ROS, GERD-  Medicated and Controlled,  Endo/Other  Hypothyroidism Morbid obesity  Renal/GU negative Renal ROS  negative genitourinary   Musculoskeletal  (+) Arthritis -, Osteoarthritis,  S/P TKR   Abdominal (+) + obese,   Peds  Hematology   Anesthesia Other Findings   Reproductive/Obstetrics PMB                             Anesthesia Physical Anesthesia Plan  ASA: III  Anesthesia Plan: General   Post-op Pain Management:    Induction: Intravenous  Airway Management Planned: LMA  Additional Equipment:   Intra-op Plan:   Post-operative Plan: Extubation in OR  Informed Consent: I have reviewed the patients History and Physical, chart, labs and discussed the procedure including the risks, benefits and alternatives for the proposed anesthesia with the patient or authorized representative who has indicated his/her understanding and acceptance.   Dental advisory given  Plan Discussed with: Anesthesiologist, CRNA and Surgeon  Anesthesia Plan Comments:         Anesthesia Quick Evaluation

## 2015-01-16 NOTE — Addendum Note (Signed)
Addendum  created 01/16/15 1348 by Tobin Chad, CRNA   Modules edited: Charges VN

## 2015-01-16 NOTE — Anesthesia Procedure Notes (Signed)
Procedure Name: LMA Insertion Date/Time: 01/16/2015 10:07 AM Performed by: Tobin Chad Pre-anesthesia Checklist: Patient identified, Timeout performed, Emergency Drugs available, Suction available and Patient being monitored Patient Re-evaluated:Patient Re-evaluated prior to inductionOxygen Delivery Method: Circle system utilized and Simple face mask Preoxygenation: Pre-oxygenation with 100% oxygen Intubation Type: IV induction Ventilation: Mask ventilation without difficulty LMA: LMA with gastric port inserted LMA Size: 4.0 Grade View: Grade II Number of attempts: 1 Placement Confirmation: CO2 detector and breath sounds checked- equal and bilateral Dental Injury: Teeth and Oropharynx as per pre-operative assessment

## 2015-01-16 NOTE — Anesthesia Postprocedure Evaluation (Signed)
  Anesthesia Post-op Note  Patient: Joan Russo  Procedure(s) Performed: Procedure(s): DILATATION AND CURETTAGE /HYSTEROSCOPY (N/A)  Patient Location: PACU  Anesthesia Type:General  Level of Consciousness: awake, alert  and oriented  Airway and Oxygen Therapy: Patient Spontanous Breathing  Post-op Pain: none  Post-op Assessment: Post-op Vital signs reviewed, Patient's Cardiovascular Status Stable, Respiratory Function Stable, Patent Airway, No signs of Nausea or vomiting and Pain level controlled  Post-op Vital Signs: Reviewed and stable  Last Vitals:  Filed Vitals:   01/16/15 1145  BP: 156/66  Pulse: 69  Temp:   Resp: 22    Complications: No apparent anesthesia complications

## 2015-01-16 NOTE — Interval H&P Note (Signed)
History and Physical Interval Note:  01/16/2015 9:44 AM  Joan Russo  has presented today for surgery, with the diagnosis of  Post menopausal bleeding  The various methods of treatment have been discussed with the patient and family. After consideration of risks, benefits and other options for treatment, the patient has consented to  Procedure(s): DILATATION AND CURETTAGE /HYSTEROSCOPY (N/A) as a surgical intervention .  The patient's history has been reviewed, patient examined, no change in status, stable for surgery.  I have reviewed the patient's chart and labs.  Questions were answered to the patient's satisfaction.     [place]

## 2015-01-17 ENCOUNTER — Encounter: Payer: Self-pay | Admitting: Family Medicine

## 2015-01-17 ENCOUNTER — Ambulatory Visit (INDEPENDENT_AMBULATORY_CARE_PROVIDER_SITE_OTHER): Payer: BLUE CROSS/BLUE SHIELD | Admitting: Family Medicine

## 2015-01-17 VITALS — BP 152/79 | HR 70 | Temp 97.7°F | Ht <= 58 in | Wt 284.0 lb

## 2015-01-17 DIAGNOSIS — G4733 Obstructive sleep apnea (adult) (pediatric): Secondary | ICD-10-CM

## 2015-01-17 DIAGNOSIS — M2550 Pain in unspecified joint: Secondary | ICD-10-CM

## 2015-01-17 DIAGNOSIS — Z6841 Body Mass Index (BMI) 40.0 and over, adult: Secondary | ICD-10-CM

## 2015-01-17 LAB — URIC ACID: URIC ACID, SERUM: 5.1 mg/dL (ref 2.4–7.0)

## 2015-01-17 LAB — VITAMIN B12: Vitamin B-12: 656 pg/mL (ref 211–911)

## 2015-01-17 LAB — CK: Total CK: 61 U/L (ref 7–177)

## 2015-01-17 LAB — SEDIMENTATION RATE: Sed Rate: 28 mm/hr — ABNORMAL HIGH (ref 0–22)

## 2015-01-17 LAB — HIGH SENSITIVITY CRP: CRP HIGH SENSITIVITY: 9.93 mg/L — AB (ref 0.000–5.000)

## 2015-01-17 NOTE — Progress Notes (Signed)
Pre visit review using our clinic review tool, if applicable. No additional management support is needed unless otherwise documented below in the visit note. 

## 2015-01-17 NOTE — Progress Notes (Signed)
Dr. Frederico Hamman T. Stavros Cail, MD, [facility] [place], [address] Phone: [phone] Fax: [phone]  01/17/2015  Patient: Joan Russo, MRN: [id number], DOB: 1956-04-03, 59 y.o.  Primary Physician:  Arnette Norris, MD  Chief Complaint: Muscle Pain and Referral  Subjective:   Joan Russo is a 59 y.o. very pleasant female patient who presents with the following:  BP Readings from Last 3 Encounters:  01/17/15 152/79  01/16/15 138/84  01/08/15 123/88    Dr. Kennon Rounds.  Sleep Apnea evaluation. The patient very recently had gynecological surgery, and she was told when she woke up her surgery that she stopped breathing a few times, and she had some alteration in her vital signs. Dr. Kennon Rounds and her treatment team told her she needed to have a very quick evaluation for sleep apnea.  She also is having diffuse arthralgias and myalgias. This is not a new finding, but it has been ongoing for a long time. The patient has a BMI of 60. In 2012 she had a sedimentation rate that was normal and she did have a rheumatoid factor that was normal, but it does not appear she has had any additional rheumatological evaluation.    Past Medical History, Surgical History, Social History, Family History, Problem List, Medications, and Allergies have been reviewed and updated if relevant.   GEN: No acute illnesses, no fevers, chills. GI: No n/v/d, eating normally Pulm: No SOB Interactive and getting along well at home.  Otherwise, ROS is as per the HPI.  Objective:   BP 152/79 mmHg  Pulse 70  Temp(Src) 97.7 F (36.5 C) (Oral)  Ht 4' 9.5" (1.461 m)  Wt 284 lb (128.822 kg)  BMI 60.35 kg/m2  SpO2 99%  GEN: WDWN, NAD, Non-toxic, A & O x 3 HEENT: Atraumatic, Normocephalic. Neck supple. No masses, No LAD. Ears and Nose: No external deformity. CV: RRR, No M/G/R. No JVD. No thrill. No extra heart sounds. PULM: CTA B, no wheezes, crackles,  rhonchi. No retractions. No resp. distress. No accessory muscle use. EXTR: No c/c/e NEURO Normal gait.  PSYCH: Normally interactive. Conversant. Not depressed or anxious appearing.  Calm demeanor.   Laboratory and Imaging Data:  Assessment and Plan:   Obstructive sleep apnea - Plan: Ambulatory referral to Pulmonology  Polyarthralgia - Plan: ANA, Vitamin I50, Cyclic citrul peptide antibody, IgG, High sensitivity CRP, Rheumatoid factor, Sedimentation rate, Uric acid, CK  Morbid obesity with BMI of 60.0-69.9, adult  >25 minutes spent in face to face time with patient, >50% spent in counselling or coordination of care: BMI of 60 with reported apneic events during surgery observed, highly suspicious for obstructive sleep apnea. Consult sleep medicine. Appreciate assistance.  Diffuse polyarthralgias and myalgias. May be multifactorial. Inactivity and BMI of 60 may be contributory. Certainly, rheumatological disease could not be excluded. I will obtain basic evaluation per below for rheumatological diseases as well as other deficiencies that could be contributing to these pains. Medications reviewed. Not likely culprits.  Follow-up: No Follow-up on file.  New Prescriptions   No medications on file   Orders Placed This Encounter  Procedures  . ANA  . Vitamin B12  . Cyclic citrul peptide antibody, IgG  . High sensitivity CRP  . Rheumatoid factor  . Sedimentation rate  . Uric acid  . CK  . Ambulatory referral to Pulmonology    Signed,  Frederico Hamman T. Delores Thelen, MD   Patient's Medications  New Prescriptions  No medications on file  Previous Medications   ALPRAZOLAM (XANAX) 0.5 MG TABLET    Take 1 tablet (0.5 mg total) by mouth at bedtime as needed for anxiety.   CLOBETASOL CREAM (TEMOVATE) 0.05 %    Apply 1 application topically 2 (two) times daily. X 6 wks, then drop to once daily x 6 wks, then 2x/wk   DICLOFENAC (VOLTAREN) 75 MG EC TABLET    TAKE ONE TABLET BY MOUTH TWICE DAILY    DULOXETINE (CYMBALTA) 60 MG CAPSULE    TAKE 1 CAPSULE (60 MG TOTAL) BY MOUTH DAILY.   MULTIPLE VITAMINS-MINERALS (MULTIVITAMIN PO)    Take 1 tablet by mouth daily.   PANTOPRAZOLE (PROTONIX) 40 MG TABLET    TAKE 1 TABLET (40 MG TOTAL) BY MOUTH DAILY.  Modified Medications   No medications on file  Discontinued Medications   No medications on file

## 2015-01-18 LAB — ANA: Anti Nuclear Antibody(ANA): NEGATIVE

## 2015-01-18 LAB — RHEUMATOID FACTOR: Rhuematoid fact SerPl-aCnc: <10 IU/mL (ref <=14)

## 2015-01-18 LAB — CYCLIC CITRUL PEPTIDE ANTIBODY, IGG

## 2015-01-21 ENCOUNTER — Ambulatory Visit: Payer: BLUE CROSS/BLUE SHIELD | Admitting: Family Medicine

## 2015-01-31 ENCOUNTER — Telehealth: Payer: Self-pay

## 2015-01-31 ENCOUNTER — Encounter: Payer: Self-pay | Admitting: Family Medicine

## 2015-01-31 ENCOUNTER — Ambulatory Visit (INDEPENDENT_AMBULATORY_CARE_PROVIDER_SITE_OTHER): Payer: BLUE CROSS/BLUE SHIELD | Admitting: Family Medicine

## 2015-01-31 VITALS — BP 148/92 | HR 70 | Temp 98.0°F | Wt 279.0 lb

## 2015-01-31 DIAGNOSIS — Z6841 Body Mass Index (BMI) 40.0 and over, adult: Secondary | ICD-10-CM

## 2015-01-31 DIAGNOSIS — IMO0001 Reserved for inherently not codable concepts without codable children: Secondary | ICD-10-CM

## 2015-01-31 DIAGNOSIS — N950 Postmenopausal bleeding: Secondary | ICD-10-CM

## 2015-01-31 DIAGNOSIS — R7982 Elevated C-reactive protein (CRP): Secondary | ICD-10-CM | POA: Insufficient documentation

## 2015-01-31 DIAGNOSIS — F4321 Adjustment disorder with depressed mood: Secondary | ICD-10-CM

## 2015-01-31 DIAGNOSIS — R700 Elevated erythrocyte sedimentation rate: Secondary | ICD-10-CM | POA: Insufficient documentation

## 2015-01-31 DIAGNOSIS — R030 Elevated blood-pressure reading, without diagnosis of hypertension: Secondary | ICD-10-CM

## 2015-01-31 DIAGNOSIS — M2550 Pain in unspecified joint: Secondary | ICD-10-CM

## 2015-01-31 DIAGNOSIS — I10 Essential (primary) hypertension: Secondary | ICD-10-CM | POA: Insufficient documentation

## 2015-01-31 DIAGNOSIS — R0681 Apnea, not elsewhere classified: Secondary | ICD-10-CM | POA: Insufficient documentation

## 2015-01-31 MED ORDER — DULOXETINE HCL 60 MG PO CPEP: 60.0000 mg | ORAL_CAPSULE | Freq: Every day | ORAL | Status: DC

## 2015-01-31 MED ORDER — HYDROCHLOROTHIAZIDE 12.5 MG PO CAPS: 12.5000 mg | ORAL_CAPSULE | Freq: Every day | ORAL | Status: DC

## 2015-01-31 MED ORDER — PANTOPRAZOLE SODIUM 40 MG PO TBEC: 40.0000 mg | DELAYED_RELEASE_TABLET | Freq: Every day | ORAL | Status: DC

## 2015-01-31 MED ORDER — DICLOFENAC SODIUM 75 MG PO TBEC: 75.0000 mg | DELAYED_RELEASE_TABLET | Freq: Two times a day (BID) | ORAL | Status: DC

## 2015-01-31 NOTE — Assessment & Plan Note (Signed)
S/p D and C. Path neg. No recurrent bleeding.

## 2015-01-31 NOTE — Assessment & Plan Note (Signed)
Probable OSA. Keep appt with Dr. Lauro Franklin study.

## 2015-01-31 NOTE — Assessment & Plan Note (Signed)
Persistent with elevated SED and hCRP. Consistent with OA- cannot rule out component of fibromyalgia either. Continue cymbalta.  Discussed importance of weight loss. The patient indicates understanding of these issues and agrees with the plan.

## 2015-01-31 NOTE — Telephone Encounter (Signed)
Pt was seen earlier today and HCTZ  12.5 mg did not go electronically to [facility]; [facility] as instructed. Pt notified and will ck with pharmacy.

## 2015-01-31 NOTE — Assessment & Plan Note (Signed)
See above

## 2015-01-31 NOTE — Patient Instructions (Addendum)
Great to see you. We are starting Hydrochlorothiazide 12.5 mg daily. Please come see me in 2- 3 weeks.  You are going do well in the Year of [place]!

## 2015-01-31 NOTE — Progress Notes (Signed)
Subjective:   Patient ID: Joan Russo, female    DOB: 06/06/1956, 59 y.o.   MRN: [id number]  Joan Russo is a pleasant 59 y.o. year old female who presents to clinic today with Follow-up and Muscle Pain  on 01/31/2015  HPI:  PMB- I saw her in 11/2014 for acute onset of post menopausal bleeding.  Referred her to GYN.   Dr. Kennon Rounds performed D and C on 01/16/15- path neg.  During surgery, apneic episodes observed, referred back here for further evaluation.  Saw Dr. Lorelei Pont on 01/17/15- note reviewed. He appropriately referred her to pulm for sleep study- appt scheduled with Dr. Gwenette Greet on 02/19/15.  She was also complaining of myalgias and arthralgias at that office visit so Dr. Lorelei Pont ordered rheum panel. SED rate and hCRP elevated, RF, B12 ANA, anit CCP uric acid, total CK all unremarkable.  Elevated blood pressure-  Has been increasing over past several months.  She was told it was in the 170s recently at GYN office. She denies HA, blurred vision, CP or new SOB but has noticed more swelling in her hands at the end of the day.  Legs seem.   BP Readings from Last 3 Encounters:  01/31/15 148/92  01/17/15 152/79  01/16/15 138/84   Morbid obesity- I referred her previously for bariatric surgery but after attending the seminar, she decided not to proceed.  A previous bariatric patient talked at the seminar about gaining weight back and Nigel feels she can lose the weight on her own.  Not very active.  Works from home.   Wt Readings from Last 3 Encounters:  01/31/15 279 lb (126.554 kg)  01/17/15 284 lb (128.822 kg)  01/08/15 278 lb (126.1 kg)   Feels current dose of Cymbalta is working well.   Misses her husband but overall feels "much better." Current Outpatient Prescriptions on File Prior to Visit  Medication Sig Dispense Refill  . ALPRAZolam (XANAX) 61 MG tablet Take 1 tablet (0.5 mg total) by mouth at bedtime as needed for anxiety. 30 tablet 0  . clobetasol cream  (TEMOVATE) 0.17 % Apply 1 application topically 2 (two) times daily. X 6 wks, then drop to once daily x 6 wks, then 2x/wk 30 g 5  . diclofenac (VOLTAREN) 75 MG EC tablet TAKE ONE TABLET BY MOUTH TWICE DAILY (Patient taking differently: Take 75 mg by mouth 2 (two) times daily. TAKE ONE TABLET BY MOUTH TWICE DAILY) 60 tablet 5  . DULoxetine (CYMBALTA) 60 MG capsule TAKE 1 CAPSULE (60 MG TOTAL) BY MOUTH DAILY. (Patient taking differently: Take 60 mg by mouth daily. TAKE 1 CAPSULE (60 MG TOTAL) BY MOUTH DAILY.) 30 capsule 2  . Multiple Vitamins-Minerals (MULTIVITAMIN PO) Take 1 tablet by mouth daily.    . pantoprazole (PROTONIX) 40 MG tablet TAKE 1 TABLET (40 MG TOTAL) BY MOUTH DAILY. (Patient taking differently: Take 40 mg by mouth daily. TAKE 1 TABLET (40 MG TOTAL) BY MOUTH DAILY.) 30 tablet 5   No current facility-administered medications on file prior to visit.    No Known Allergies  Past Medical History  Diagnosis Date  . Obesity, Class III, BMI 40-49.9 (morbid obesity)   . Anxiety and depression   . Vitamin D deficiency   . Urinary incontinence   . OA (osteoarthritis)   . Anxiety   . Depression   . Osteopenia   . GERD (gastroesophageal reflux disease)     Past Surgical History  Procedure Laterality Date  . 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 Hysteroscopy w/d&c N/A 01/16/2015    Procedure: DILATATION AND CURETTAGE /HYSTEROSCOPY;  Surgeon: Donnamae Jude, MD;  Location: [facility];  Service: Gynecology;  Laterality: N/A;    Family History  Problem Relation Age of Onset  . Cancer Mother 60    breast  . Cancer Daughter 38    breast  . Colon cancer Neg Hx   . Esophageal cancer Neg Hx   . Rectal cancer Neg Hx   . Stomach cancer Neg Hx     History   Social History  . Marital Status: Widowed    Spouse Name: N/A    Number of Children: N/A  . Years of Education: N/A   Occupational History  . Not on file.   Social History Main Topics  . Smoking status: Never Smoker     . Smokeless tobacco: Never Used  . Alcohol Use: No  . Drug Use: No  . Sexual Activity: Not on file   Other Topics Concern  . Not on file   Social History Narrative   The PMH, PSH, Social History, Family History, Medications, and allergies have been reviewed in [facility], and have been updated if relevant.   Review of Systems  Constitutional: Positive for fatigue.  HENT: Negative.   Respiratory: Positive for shortness of breath.   Cardiovascular: Negative.   Endocrine: Negative.   Genitourinary: Negative.   Musculoskeletal: Positive for myalgias, back pain and arthralgias.  Skin: Negative.   Allergic/Immunologic: Negative.   Neurological: Negative.   Hematological: Negative.   Psychiatric/Behavioral: Negative.   All other systems reviewed and are negative.      Objective:    BP 148/92 mmHg  Pulse 70  Temp(Src) 98 F (36.7 C) (Oral)  Wt 279 lb (126.554 kg)  SpO2 95%   Physical Exam  Constitutional: She is oriented to person, place, and time. She appears well-developed and well-nourished. No distress.  Morbidly obese  HENT:  Head: Normocephalic.  Eyes: Conjunctivae are normal.  Neck: Normal range of motion.  Cardiovascular: Normal rate and regular rhythm.   Pulmonary/Chest: Effort normal and breath sounds normal.  Musculoskeletal: Normal range of motion. She exhibits no edema.  Thick ankles  Neurological: She is alert and oriented to person, place, and time. No cranial nerve deficit.  Skin: Skin is warm and dry.  Psychiatric: She has a normal mood and affect. Her behavior is normal. Judgment and thought content normal.  Nursing note and vitals reviewed.         Assessment & Plan:   Postmenopausal vaginal bleeding  Elevated C-reactive protein (CRP)  Elevated sed rate No Follow-up on file.

## 2015-01-31 NOTE — Progress Notes (Signed)
Pre visit review using our clinic review tool, if applicable. No additional management support is needed unless otherwise documented below in the visit note. 

## 2015-01-31 NOTE — Assessment & Plan Note (Signed)
Continue Cymbalta. She seems to be coping well. Offered my continued support.

## 2015-01-31 NOTE — Assessment & Plan Note (Signed)
Persistent. Start HCTZ 12.5 mg daily. Follow up in 2-3 weeks. The patient indicates understanding of these issues and agrees with the plan.

## 2015-02-19 ENCOUNTER — Encounter: Payer: Self-pay | Admitting: Pulmonary Disease

## 2015-02-19 ENCOUNTER — Ambulatory Visit (INDEPENDENT_AMBULATORY_CARE_PROVIDER_SITE_OTHER): Payer: BLUE CROSS/BLUE SHIELD | Admitting: Pulmonary Disease

## 2015-02-19 VITALS — BP 124/70 | HR 66 | Temp 97.4°F | Ht <= 58 in | Wt 275.0 lb

## 2015-02-19 DIAGNOSIS — G4733 Obstructive sleep apnea (adult) (pediatric): Secondary | ICD-10-CM | POA: Insufficient documentation

## 2015-02-19 NOTE — Patient Instructions (Signed)
Will schedule for home sleep testing, and arrange followup once the results are available.  Work on weight loss

## 2015-02-19 NOTE — Assessment & Plan Note (Signed)
The patient has a history of loud snoring with frequent awakenings and nonrestorative sleep, most recently has had witnessed apneas after a minor surgical procedure. She is morbidly obese, and has abnormal upper airway anatomy. I think that she is at significant risk for clinically significant sleep disordered breathing, but will obviously need a sleep study for evaluation. I have had a long discussion with her about the pathophysiology of sleep apnea, including its impact to quality of life and cardiovascular health. I think she is an excellent candidate for home sleep testing, and the patient is agreeable.

## 2015-02-19 NOTE — Progress Notes (Signed)
Subjective:    Patient ID: Joan Russo, female    DOB: 1956/09/09, 59 y.o.   MRN: [id number]  HPI The patient is a 59 year old female who I've been asked to see for possible obstructive sleep apnea. She is morbidly obese and recently had a gynecologic procedure where she was noted to have witnessed apnea in the postoperative period. She has been noted to have loud snoring by her previous bed partner, but currently sleeps alone, with no one to comment on witnessed apnea.  She does have frequent awakenings at night, and is not rested in the mornings upon arising. Despite this, she does not feel that she has significant daytime alertness issues, except occasionally after lunch. She has occasional dozing in the evenings with reading or television but this is not consistent. She has no issues driving shorter or longer distances with respect to sleepiness. The patient states that her weight is neutral over the last few years, and her Epworth score today is 3   Sleep Questionnaire What time do you typically go to bed?( Between what hours) 9-10p 9-10p at 1435 on 02/19/15 by Inge Rise, CMA How long does it take you to fall asleep? 30 min 30 min at 1435 on 02/19/15 by Inge Rise, CMA How many times during the night do you wake up? 5 5 at 1435 on 02/19/15 by Inge Rise, [place] What time do you get out of bed to start your day? 0600 0600 at 1435 on 02/19/15 by Inge Rise, CMA Do you drive or operate heavy machinery in your occupation? No No at 1435 on 02/19/15 by Inge Rise, CMA How much has your weight changed (up or down) over the past two years? (In pounds) 0 oz (0 kg) 0 oz (0 kg) at 1435 on 02/19/15 by Inge Rise, CMA Have you ever had a sleep study before? No No at 1435 on 02/19/15 by Inge Rise, CMA Do you currently use CPAP? No No at 1435 on 02/19/15 by Inge Rise, CMA Do you wear oxygen at any time? No No at 1435 on 02/19/15 by Inge Rise,  CMA   Review of Systems  Constitutional: Negative for fever and unexpected weight change.  HENT: Negative for congestion, dental problem, ear pain, nosebleeds, postnasal drip, rhinorrhea, sinus pressure, sneezing, sore throat and trouble swallowing.   Eyes: Negative for redness and itching.  Respiratory: Positive for shortness of breath. Negative for cough, chest tightness and wheezing.   Cardiovascular: Positive for leg swelling. Negative for palpitations.  Gastrointestinal: Negative for nausea and vomiting.  Genitourinary: Negative for dysuria.  Musculoskeletal: Negative for joint swelling.  Skin: Negative for rash.  Neurological: Negative for headaches.  Hematological: Does not bruise/bleed easily.  Psychiatric/Behavioral: Positive for dysphoric mood. The patient is nervous/anxious.        Objective:   Physical Exam Constitutional:  Obese female, no acute distress  HENT:  Nares patent without discharge, deviated septum to left with narrowing.  Oropharynx without exudate, palate and uvula are thick and elongated.   Eyes:  Perrla, eomi, no scleral icterus  Neck:  No JVD, no TMG  Cardiovascular:  Normal rate, regular rhythm, no rubs or gallops.  No murmurs        Intact distal pulses  Pulmonary :  Normal breath sounds, no stridor or respiratory distress   No rales, rhonchi, or wheezing  Abdominal:  Soft, nondistended, bowel sounds present.  No tenderness noted.   Musculoskeletal:  1+ lower extremity edema noted.  Lymph Nodes:  No cervical lymphadenopathy noted  Skin:  No cyanosis noted  Neurologic:  Alert, appropriate, moves all 4 extremities without obvious deficit.         Assessment & Plan:

## 2015-03-02 ENCOUNTER — Other Ambulatory Visit: Payer: Self-pay | Admitting: Family Medicine

## 2015-03-04 ENCOUNTER — Other Ambulatory Visit: Payer: Self-pay | Admitting: Family Medicine

## 2015-03-14 ENCOUNTER — Other Ambulatory Visit: Payer: Self-pay | Admitting: Family Medicine

## 2015-03-25 DIAGNOSIS — G4730 Sleep apnea, unspecified: Secondary | ICD-10-CM | POA: Diagnosis not present

## 2015-03-28 ENCOUNTER — Other Ambulatory Visit: Payer: Self-pay | Admitting: *Deleted

## 2015-03-28 ENCOUNTER — Telehealth: Payer: Self-pay | Admitting: Pulmonary Disease

## 2015-03-28 DIAGNOSIS — G4730 Sleep apnea, unspecified: Secondary | ICD-10-CM

## 2015-03-28 DIAGNOSIS — G4733 Obstructive sleep apnea (adult) (pediatric): Secondary | ICD-10-CM

## 2015-03-28 NOTE — Telephone Encounter (Signed)
Called pt and appt scheduled for 4/5 at 4:30

## 2015-03-28 NOTE — Telephone Encounter (Signed)
Pt needs ov to review sleep study 

## 2015-04-02 ENCOUNTER — Ambulatory Visit (INDEPENDENT_AMBULATORY_CARE_PROVIDER_SITE_OTHER): Payer: BLUE CROSS/BLUE SHIELD | Admitting: Pulmonary Disease

## 2015-04-02 ENCOUNTER — Encounter: Payer: Self-pay | Admitting: Pulmonary Disease

## 2015-04-02 VITALS — BP 120/82 | HR 74 | Temp 97.1°F | Ht <= 58 in | Wt 273.8 lb

## 2015-04-02 DIAGNOSIS — G4733 Obstructive sleep apnea (adult) (pediatric): Secondary | ICD-10-CM

## 2015-04-02 NOTE — Assessment & Plan Note (Signed)
The patient has at least moderate obstructive sleep apnea based on her recent home sleep test. She tells me that she did not sleep the whole duration of the study, and therefore her AHI is even higher. I have had a long discussion with her about the treatment of sleep apnea, and feel that she would do best with a trial of C Pap while working on weight loss. The patient is agreeable to this approach, provided that it is affordable. We'll send an order to a home care company, and get him to work with the patient on cost. If she decides to start on treatment, I need to see her back in approximately 8 weeks.

## 2015-04-02 NOTE — Patient Instructions (Signed)
Will send an order to a home care company to find out what are the costs associated with a cpap machine. Work on weight loss If you start on the device, will need to see you back in 8 weeks.  If issues with tolerance, please call us to help

## 2015-04-02 NOTE — Progress Notes (Signed)
   Subjective:    Patient ID: Joan Russo, female    DOB: [birth year], 59 y.o.   MRN: [id number]  HPI The patient comes in today for follow-up of her recent home sleep test. She was found to have at least moderate obstructive sleep apnea, with an AHI of 21 events per hour and transient oxygen desaturation as low as 59%. The patient tells me that she did not sleep the duration of the monitoring time, and therefore her sleep apnea is probably worse. I have reviewed the study with her in detail, and answered all of her questions.   Review of Systems  Constitutional: Negative for fever, chills and unexpected weight change.  HENT: Negative for congestion, dental problem, ear pain, nosebleeds, postnasal drip, rhinorrhea, sinus pressure, sneezing, sore throat, trouble swallowing and voice change.   Eyes: Negative for visual disturbance.  Respiratory: Negative for cough, choking and shortness of breath.   Cardiovascular: Negative for chest pain and leg swelling.  Gastrointestinal: Negative for vomiting, abdominal pain and diarrhea.  Genitourinary: Negative for difficulty urinating.  Musculoskeletal: Negative for arthralgias.  Skin: Negative for rash.  Neurological: Negative for tremors, syncope and headaches.  Hematological: Does not bruise/bleed easily.       Objective:   Physical Exam Morbidly obese female in no acute distress Nose without purulence or discharge noted Neck without lymphadenopathy or thyromegaly Lower extremities with mild edema, no cyanosis Alert and oriented, moves all 4 extremities.       Assessment & Plan:

## 2015-04-17 ENCOUNTER — Other Ambulatory Visit: Payer: Self-pay | Admitting: Family Medicine

## 2015-04-22 ENCOUNTER — Telehealth: Payer: Self-pay | Admitting: Pulmonary Disease

## 2015-04-22 DIAGNOSIS — G4733 Obstructive sleep apnea (adult) (pediatric): Secondary | ICD-10-CM

## 2015-04-22 NOTE — Telephone Encounter (Signed)
Called spoke with pt. She reports when she first goes to sleep she has machine set at 5 ramp. She is on auto 5-15 cm. She feels she has hard time falling asleep and struggling to breathe when it starts out. She wants the lower pressure setting increased. She reports she is breaking seal on mask so the pressure will increase so she can breathe. Please advise thanks

## 2015-04-22 NOTE — Telephone Encounter (Signed)
Increase lower pressure limit to 8cm

## 2015-04-22 NOTE — Telephone Encounter (Signed)
Pt aware order placed. Nothing further needed

## 2015-05-24 ENCOUNTER — Other Ambulatory Visit: Payer: Self-pay | Admitting: Family Medicine

## 2015-05-29 ENCOUNTER — Other Ambulatory Visit: Payer: Self-pay | Admitting: Family Medicine

## 2015-08-14 ENCOUNTER — Other Ambulatory Visit: Payer: Self-pay | Admitting: Family Medicine

## 2015-08-22 ENCOUNTER — Encounter: Payer: Self-pay | Admitting: Internal Medicine

## 2015-08-22 ENCOUNTER — Ambulatory Visit: Payer: BLUE CROSS/BLUE SHIELD | Admitting: Family Medicine

## 2015-09-06 ENCOUNTER — Telehealth: Payer: Self-pay

## 2015-09-06 NOTE — Telephone Encounter (Signed)
Last filled 11/2014--last OV 01/2015 for f/u--please advise

## 2015-09-06 NOTE — Telephone Encounter (Signed)
Fosamax--I'm sorry

## 2015-09-06 NOTE — Telephone Encounter (Signed)
Ok to refill as requested 

## 2015-09-06 NOTE — Telephone Encounter (Signed)
Which rx is she requesting?

## 2015-09-09 MED ORDER — ALENDRONATE SODIUM 70 MG PO TABS: 70.0000 mg | ORAL_TABLET | ORAL | Status: DC

## 2015-09-11 ENCOUNTER — Other Ambulatory Visit: Payer: Self-pay | Admitting: *Deleted

## 2015-09-11 MED ORDER — ALPRAZOLAM 0.5 MG PO TABS: 0.5000 mg | ORAL_TABLET | Freq: Every evening | ORAL | Status: DC | PRN

## 2015-09-11 NOTE — Telephone Encounter (Signed)
Ok to refill as requested 

## 2015-09-11 NOTE — Telephone Encounter (Signed)
Ok to refill xanax 0.5 1 QHS and PRN for anxiety. Not on current med list. [facility]. Please send back to [facility] as I'm not here this afternoon. Thanks!

## 2015-09-11 NOTE — Telephone Encounter (Signed)
Rx called in to requested pharmacy 

## 2015-09-11 NOTE — Telephone Encounter (Signed)
pts chart indicates this Rx was "for short period only for grief" and no refills to be granted. Pt was not for anxiety, per both you and Rollene Fare, NP. Are you still wanting to fill? How many tabs should she be given?

## 2015-09-21 ENCOUNTER — Other Ambulatory Visit: Payer: Self-pay | Admitting: Family Medicine

## 2015-10-04 ENCOUNTER — Emergency Department: Payer: BLUE CROSS/BLUE SHIELD

## 2015-10-04 ENCOUNTER — Encounter: Payer: Self-pay | Admitting: Emergency Medicine

## 2015-10-04 ENCOUNTER — Emergency Department
Admission: EM | Admit: 2015-10-04 | Discharge: 2015-10-04 | Disposition: A | Discharge status: Home / Self Care | Payer: BLUE CROSS/BLUE SHIELD | Attending: Emergency Medicine | Admitting: Emergency Medicine

## 2015-10-04 DIAGNOSIS — R079 Chest pain, unspecified: Secondary | ICD-10-CM | POA: Diagnosis present

## 2015-10-04 DIAGNOSIS — J40 Bronchitis, not specified as acute or chronic: Secondary | ICD-10-CM | POA: Diagnosis not present

## 2015-10-04 DIAGNOSIS — Z79899 Other long term (current) drug therapy: Secondary | ICD-10-CM | POA: Diagnosis not present

## 2015-10-04 DIAGNOSIS — M549 Dorsalgia, unspecified: Secondary | ICD-10-CM | POA: Diagnosis not present

## 2015-10-04 LAB — BASIC METABOLIC PANEL
ANION GAP: 9 (ref 5–15)
BUN: 20 mg/dL (ref 6–20)
CALCIUM: 9.7 mg/dL (ref 8.9–10.3)
CO2: 28 mmol/L (ref 22–32)
Chloride: 105 mmol/L (ref 101–111)
Creatinine, Ser: 0.87 mg/dL (ref 0.44–1.00)
GFR calc Af Amer: >60 mL/min (ref >60)
GFR calc non Af Amer: >60 mL/min (ref >60)
Glucose, Bld: 121 mg/dL — ABNORMAL HIGH (ref 65–99)
POTASSIUM: 3.6 mmol/L (ref 3.5–5.1)
Sodium: 142 mmol/L (ref 135–145)

## 2015-10-04 LAB — FIBRIN DERIVATIVES D-DIMER (ARMC ONLY): Fibrin derivatives D-dimer (ARMC): 787 — ABNORMAL HIGH (ref 0–499)

## 2015-10-04 LAB — BRAIN NATRIURETIC PEPTIDE: B Natriuretic Peptide: 47 pg/mL (ref 0.0–100.0)

## 2015-10-04 LAB — CBC
HCT: 43.2 % (ref 35.0–47.0)
HEMOGLOBIN: 14.3 g/dL (ref 12.0–16.0)
MCH: 29.5 pg (ref 26.0–34.0)
MCHC: 33.1 g/dL (ref 32.0–36.0)
MCV: 89.2 fL (ref 80.0–100.0)
Platelets: 270 10*3/uL (ref 150–440)
RBC: 4.84 MIL/uL (ref 3.80–5.20)
RDW: 14 % (ref 11.5–14.5)
WBC: 10.9 10*3/uL (ref 3.6–11.0)

## 2015-10-04 LAB — TROPONIN I: Troponin I: <0.03 ng/mL (ref <0.031)

## 2015-10-04 MED ORDER — PREDNISONE 50 MG PO TABS: 50.0000 mg | ORAL_TABLET | Freq: Every day | ORAL | Status: AC

## 2015-10-04 MED ORDER — ALBUTEROL SULFATE HFA 108 (90 BASE) MCG/ACT IN AERS: 2.0000 | INHALATION_SPRAY | Freq: Four times a day (QID) | RESPIRATORY_TRACT | Status: DC | PRN

## 2015-10-04 MED ORDER — MORPHINE SULFATE (PF) 4 MG/ML IV SOLN
4.0000 mg | Freq: Once | INTRAVENOUS | Status: AC
  Administered 2015-10-04: 4 mg via INTRAVENOUS
  Filled 2015-10-04: qty 1

## 2015-10-04 MED ORDER — METHYLPREDNISOLONE SODIUM SUCC 125 MG IJ SOLR
125.0000 mg | Freq: Once | INTRAMUSCULAR | Status: AC
  Administered 2015-10-04: 125 mg via INTRAVENOUS
  Filled 2015-10-04: qty 2

## 2015-10-04 MED ORDER — IOHEXOL 350 MG/ML SOLN
100.0000 mL | Freq: Once | INTRAVENOUS | Status: AC | PRN
  Administered 2015-10-04: 100 mL via INTRAVENOUS

## 2015-10-04 MED ORDER — IPRATROPIUM-ALBUTEROL 0.5-2.5 (3) MG/3ML IN SOLN
3.0000 mL | Freq: Once | RESPIRATORY_TRACT | Status: AC
  Administered 2015-10-04: 3 mL via RESPIRATORY_TRACT
  Filled 2015-10-04: qty 3

## 2015-10-04 NOTE — ED Provider Notes (Signed)
CSN: [id number]     Arrival date & time 10/04/15  1034 History   First MD Initiated Contact with Patient 10/04/15 1042     Chief Complaint  Patient presents with  . Chest Pain     (Consider location/radiation/quality/duration/timing/severity/associated sxs/prior Treatment) The history is provided by the patient.  Joan Russo is a 59 y.o. female hx of obesity, anxiety here with chest pain, shortness of breath. Patient woke up this morning at 7am and was feeling fine. Around 8 AM, she had some left-sided back pain and chest pain. Also had breath when she takes a deep breath. Denies any radiation of the pain. Denies any abdominal pain. Denies any fever or cough. Took some anxiety medicine but still has pain so came for evaluation. No hx of CAD, no recent travel. No hx of DVT/PE.    Past Medical History  Diagnosis Date  . Obesity, Class III, BMI 40-49.9 (morbid obesity) ([place])   . Anxiety and depression   . Vitamin D deficiency   . Urinary incontinence   . OA (osteoarthritis)   . Anxiety   . Depression   . Osteopenia   . GERD (gastroesophageal reflux disease)    Past Surgical History  Procedure Laterality Date  . Total knee arthroplasty  08/2008    Left  . Hysteroscopy w/d&c N/A 01/16/2015    Procedure: DILATATION AND CURETTAGE /HYSTEROSCOPY;  Surgeon: Donnamae Jude, MD;  Location: [facility];  Service: Gynecology;  Laterality: N/A;   Family History  Problem Relation Age of Onset  . Cancer Mother 2    breast  . Cancer Daughter 73    breast  . Colon cancer Neg Hx   . Esophageal cancer Neg Hx   . Rectal cancer Neg Hx   . Stomach cancer Neg Hx   . Cancer Father    Social History  Substance Use Topics  . Smoking status: Never Smoker   . Smokeless tobacco: Never Used  . Alcohol Use: No   OB History    Gravida Para Term Preterm AB TAB SAB Ectopic Multiple Living   2 2        2      Review of Systems  Respiratory: Positive for shortness of breath.   Cardiovascular:  Positive for chest pain.  All other systems reviewed and are negative.     Allergies  Review of patient's allergies indicates no known allergies.  Home Medications   Prior to Admission medications   Medication Sig Start Date End Date Taking? Authorizing Provider  alendronate (FOSAMAX) 70 MG tablet Take 1 tablet (70 mg total) by mouth every 7 (seven) days. Take with a full glass of water on an empty stomach. 09/09/15   Lucille Passy, MD  ALPRAZolam Duanne Moron) 0.5 MG tablet Take 1 tablet (0.5 mg total) by mouth at bedtime as needed for anxiety. 09/11/15   Lucille Passy, MD  clobetasol cream (TEMOVATE) 4.12 % Apply 1 application topically 2 (two) times daily. X 6 wks, then drop to once daily x 6 wks, then 2x/wk 12/24/14   Donnamae Jude, MD  diclofenac (VOLTAREN) 75 MG EC tablet TAKE ONE TABLET BY MOUTH TWICE DAILY 08/14/15   Lucille Passy, MD  DULoxetine (CYMBALTA) 60 MG capsule TAKE ONE CAPSULE BY MOUTH EVERY DAY 09/23/15   Lucille Passy, MD  hydrochlorothiazide (MICROZIDE) 12.5 MG capsule TAKE ONE CAPSULE BY MOUTH EVERY DAY 04/17/15   Lucille Passy, MD  Multiple Vitamins-Minerals (MULTIVITAMIN PO) Take 1 tablet  by mouth daily.    Historical Provider, MD   BP 123/58 mmHg  Pulse 71  Temp(Src) 97.5 F (36.4 C) (Oral)  Resp 13  Ht 4\' 10"  (1.473 m)  Wt 280 lb (127.007 kg)  BMI 58.54 kg/m2  SpO2 100% Physical Exam  Constitutional: She is oriented to person, place, and time.  Slightly uncomfortable   HENT:  Head: Normocephalic.  Mouth/Throat: Oropharynx is clear and moist.  Eyes: Conjunctivae are normal. Pupils are equal, round, and reactive to light.  Neck: Normal range of motion. Neck supple.  Cardiovascular: Normal rate and regular rhythm.   Pulmonary/Chest: Effort normal and breath sounds normal. No respiratory distress. She has no wheezes. She has no rales.  Mildly reproducible L sided back tenderness   Abdominal: Soft. Bowel sounds are normal. She exhibits no distension. There is no  tenderness. There is no rebound.  Musculoskeletal: Normal range of motion. She exhibits no edema or tenderness.  Neurological: She is alert and oriented to person, place, and time.  Skin: Skin is warm and dry.  Psychiatric: She has a normal mood and affect. Her behavior is normal. Judgment and thought content normal.  Nursing note and vitals reviewed.   ED Course  Procedures (including critical care time) Labs Review Labs Reviewed  BASIC METABOLIC PANEL - Abnormal; Notable for the following:    Glucose, Bld 121 (*)    All other components within normal limits  FIBRIN DERIVATIVES D-DIMER ([facility] ONLY) - Abnormal; Notable for the following:    Fibrin derivatives D-dimer ([facility]) 787 (*)    All other components within normal limits  CBC  TROPONIN I  BRAIN NATRIURETIC PEPTIDE  TROPONIN I    Imaging Review Dg Chest 2 View  10/04/2015   CLINICAL DATA:  Left-sided chest pain since this morning  EXAM: CHEST - 2 VIEW  COMPARISON:  None.  FINDINGS: The heart size and mediastinal contours are within normal limits. Both lungs are clear. The visualized skeletal structures are unremarkable.  IMPRESSION: No active disease.   Electronically Signed   By: Inez Catalina M.D.   On: 10/04/2015 11:10   Ct Angio Chest Pe W/cm &/or Wo Cm  10/04/2015   CLINICAL DATA:  Acute left-sided chest pain.  EXAM: CT ANGIOGRAPHY CHEST WITH CONTRAST  TECHNIQUE: Multidetector CT imaging of the chest was performed using the standard protocol during bolus administration of intravenous contrast. Multiplanar CT image reconstructions and MIPs were obtained to evaluate the vascular anatomy.  CONTRAST:  140mL OMNIPAQUE IOHEXOL 350 MG/ML SOLN  COMPARISON:  Chest radiograph of same day.  FINDINGS: Multilevel degenerative disc disease is noted in the lower thoracic spine. No pneumothorax or pleural effusion is noted. Mosaic pattern is seen in the pulmonary parenchyma bilaterally which may represent multifocal inflammation or air trapping  due to small airways disease. There is no evidence of thoracic aortic dissection or aneurysm. No definite evidence of pulmonary embolus is noted. No significant mediastinal mass or adenopathy is noted. Visualized portion of upper abdomen is unremarkable.  Review of the MIP images confirms the above findings.  IMPRESSION: No definite evidence of pulmonary embolus.  Mosaic pattern is seen in the pulmonary parenchyma bilaterally which may represent multifocal inflammation or air trapping due to small airway disease.   Electronically Signed   By: Marijo Conception, M.D.   On: 10/04/2015 14:04   I have personally reviewed and evaluated these images and lab results as part of my medical decision-making.   EKG Interpretation None  ED ECG REPORT I, YAO, DAVID, the attending physician, personally viewed and interpreted this ECG.   Date: 10/04/2015  EKG Time: 10:42 am  Rate: 75  Rhythm: normal EKG, normal sinus rhythm, unchanged from previous tracings  Axis: normal  Intervals:none  ST&T Change: nonspecific changes    MDM   Final diagnoses:  None   ADLYNN LOWENSTEIN is a 59 y.o. female here with chest pain, shortness of breath. Was hypertensive on arrival. Differential fairly broad. Consider PE vs dissection vs ACS vs CHF. Will get labs, d-dimer, BNP, CXR. BP improved to 120s from 180s in triage with no intervention so if CXR showed no widened mediastinum, will not do further workup for dissection.   3:18 PM CXR nl. D-dimer elevated. CT showed some inflammation or air trapping. No hx of COPD and not a smoker. Will give prednisone, albuterol empirically for bronchitis. Will get delta trop. Signed out to Dr. Jimmye Norman.      Wandra Arthurs, MD 10/04/15 [phone]

## 2015-10-04 NOTE — ED Notes (Signed)
Pt to ed with c/o chest pain acute onset this am after waking,  Pt states worse pain with deep breath.  Pt states pain radiates from back to left side of chest.

## 2015-10-04 NOTE — Discharge Instructions (Signed)
Take prednisone as prescribed.  Use albuterol every 4 hrs for shortness of breath or wheezing.   See your doctor.   Return to ER if you have worse trouble breathing, shortness of breath, wheezing.

## 2015-10-14 ENCOUNTER — Ambulatory Visit (INDEPENDENT_AMBULATORY_CARE_PROVIDER_SITE_OTHER): Payer: BLUE CROSS/BLUE SHIELD | Admitting: Family Medicine

## 2015-10-14 ENCOUNTER — Encounter: Payer: Self-pay | Admitting: Family Medicine

## 2015-10-14 VITALS — BP 128/76 | HR 74 | Temp 98.2°F | Wt 275.2 lb

## 2015-10-14 DIAGNOSIS — Z23 Encounter for immunization: Secondary | ICD-10-CM | POA: Diagnosis not present

## 2015-10-14 DIAGNOSIS — J40 Bronchitis, not specified as acute or chronic: Secondary | ICD-10-CM | POA: Insufficient documentation

## 2015-10-14 DIAGNOSIS — R071 Chest pain on breathing: Secondary | ICD-10-CM

## 2015-10-14 DIAGNOSIS — R030 Elevated blood-pressure reading, without diagnosis of hypertension: Secondary | ICD-10-CM | POA: Diagnosis not present

## 2015-10-14 DIAGNOSIS — R079 Chest pain, unspecified: Secondary | ICD-10-CM | POA: Insufficient documentation

## 2015-10-14 DIAGNOSIS — IMO0001 Reserved for inherently not codable concepts without codable children: Secondary | ICD-10-CM

## 2015-10-14 NOTE — Progress Notes (Signed)
Subjective:   Patient ID: Joan Russo, female    DOB: Nov 23, 1956, 59 y.o.   MRN: [id number]  Joan Russo is a pleasant 59 y.o. year old female who presents to clinic today with Hospitalization Follow-up  on 10/14/2015  HPI:  Notes reviewed.  Went to [facility] on 10/7 for acute onset of CP and SOB. Hypertensive and tachycardic in ER.  CXR unremarkable, d dimer elevated. CT angio showed some inflammation and air trapping.    Given rx for prednisone and albuterol for presumed bronchitis and advised to follow up with me here today.  Negative troponins.  EKG NSR.  Finish course of prednisone and used albuterol prn.  Chest pain has resolved.  Does have h/o OSA- wears CPAP at night.  Dg Chest 2 View  10/04/2015  CLINICAL DATA:  Left-sided chest pain since this morning EXAM: CHEST - 2 VIEW COMPARISON:  None. FINDINGS: The heart size and mediastinal contours are within normal limits. Both lungs are clear. The visualized skeletal structures are unremarkable. IMPRESSION: No active disease. Electronically Signed   By: Inez Catalina M.D.   On: 10/04/2015 11:10   Ct Angio Chest Pe W/cm &/or Wo Cm  10/04/2015  CLINICAL DATA:  Acute left-sided chest pain. EXAM: CT ANGIOGRAPHY CHEST WITH CONTRAST TECHNIQUE: Multidetector CT imaging of the chest was performed using the standard protocol during bolus administration of intravenous contrast. Multiplanar CT image reconstructions and MIPs were obtained to evaluate the vascular anatomy. CONTRAST:  142mL OMNIPAQUE IOHEXOL 350 MG/ML SOLN COMPARISON:  Chest radiograph of same day. FINDINGS: Multilevel degenerative disc disease is noted in the lower thoracic spine. No pneumothorax or pleural effusion is noted. Mosaic pattern is seen in the pulmonary parenchyma bilaterally which may represent multifocal inflammation or air trapping due to small airways disease. There is no evidence of thoracic aortic dissection or aneurysm. No definite evidence of pulmonary  embolus is noted. No significant mediastinal mass or adenopathy is noted. Visualized portion of upper abdomen is unremarkable. Review of the MIP images confirms the above findings. IMPRESSION: No definite evidence of pulmonary embolus. Mosaic pattern is seen in the pulmonary parenchyma bilaterally which may represent multifocal inflammation or air trapping due to small airway disease. Electronically Signed   By: Marijo Conception, M.D.   On: 10/04/2015 14:04   Current Outpatient Prescriptions on File Prior to Visit  Medication Sig Dispense Refill  . albuterol (PROVENTIL HFA;VENTOLIN HFA) 108 (90 BASE) MCG/ACT inhaler Inhale 2 puffs into the lungs every 6 (six) hours as needed for wheezing or shortness of breath. 1 Inhaler 0  . ALPRAZolam (XANAX) 0.5 MG tablet Take 1 tablet (0.5 mg total) by mouth at bedtime as needed for anxiety. (Patient taking differently: Take 0.5 mg by mouth daily as needed for anxiety. ) 30 tablet 0  . Biotin 5000 MCG CAPS Take 5,000 mcg by mouth daily.    . clobetasol cream (TEMOVATE) 6.64 % Apply 1 application topically 2 (two) times daily. X 6 wks, then drop to once daily x 6 wks, then 2x/wk (Patient taking differently: Apply 1 application topically once a week. Pt uses on Monday.) 30 g 5  . diclofenac (VOLTAREN) 75 MG EC tablet Take 75 mg by mouth 2 (two) times daily.    . DULoxetine (CYMBALTA) 60 MG capsule Take 60 mg by mouth daily.    . hydrochlorothiazide (MICROZIDE) 12.5 MG capsule Take 12.5 mg by mouth daily.    . Multiple Vitamin (MULTIVITAMIN WITH MINERALS) TABS tablet Take 1  tablet by mouth daily.    . pantoprazole (PROTONIX) 40 MG tablet Take 40 mg by mouth daily.    Marland Kitchen alendronate (FOSAMAX) 70 MG tablet Take 1 tablet (70 mg total) by mouth every 7 (seven) days. Take with a full glass of water on an empty stomach. (Patient not taking: Reported on 10/14/2015) 4 tablet 2   No current facility-administered medications on file prior to visit.    No Known  Allergies  Past Medical History  Diagnosis Date  . Obesity, Class III, BMI 40-49.9 (morbid obesity) ([place])   . Anxiety and depression   . Vitamin D deficiency   . Urinary incontinence   . OA (osteoarthritis)   . Anxiety   . Depression   . Osteopenia   . GERD (gastroesophageal reflux disease)     Past Surgical History  Procedure Laterality Date  . Total knee arthroplasty  08/2008    Left  . Hysteroscopy w/d&c N/A 01/16/2015    Procedure: DILATATION AND CURETTAGE /HYSTEROSCOPY;  Surgeon: Donnamae Jude, MD;  Location: [facility];  Service: Gynecology;  Laterality: N/A;    Family History  Problem Relation Age of Onset  . Cancer Mother 26    breast  . Cancer Daughter 18    breast  . Colon cancer Neg Hx   . Esophageal cancer Neg Hx   . Rectal cancer Neg Hx   . Stomach cancer Neg Hx   . Cancer Father     Social History   Social History  . Marital Status: Widowed    Spouse Name: N/A  . Number of Children: 4  . Years of Education: N/A   Occupational History  . Armed forces technical officer    Social History Main Topics  . Smoking status: Never Smoker   . Smokeless tobacco: Never Used  . Alcohol Use: No  . Drug Use: No  . Sexual Activity: Not on file   Other Topics Concern  . Not on file   Social History Narrative   The PMH, PSH, Social History, Family History, Medications, and allergies have been reviewed in [facility], and have been updated if relevant.   Review of Systems  Constitutional: Negative.   HENT: Negative.   Respiratory: Negative.   Cardiovascular: Negative.   Endocrine: Negative.   Skin: Negative.   Allergic/Immunologic: Negative.   Neurological: Negative.   Psychiatric/Behavioral: Negative.   All other systems reviewed and are negative.      Objective:    BP 128/76 mmHg  Pulse 74  Temp(Src) 98.2 F (36.8 C) (Oral)  Wt 275 lb 4 oz (124.853 kg)  SpO2 97%   Physical Exam  Constitutional: She is oriented to person, place, and time. She appears  well-developed. No distress.  obese  HENT:  Head: Normocephalic and atraumatic.  Eyes: Conjunctivae are normal.  Neck: Normal range of motion.  Cardiovascular: Normal rate and regular rhythm.   Pulmonary/Chest: Effort normal.  Musculoskeletal: She exhibits no edema.  Neurological: She is alert and oriented to person, place, and time. No cranial nerve deficit.  Skin: Skin is warm and dry. She is not diaphoretic.  Psychiatric: She has a normal mood and affect. Her behavior is normal. Thought content normal.          Assessment & Plan:   Chest pain on breathing  Bronchitis  Elevated blood pressure  Need for influenza vaccination - Plan: Flu Vaccine QUAD 36+ mos PF IM (Fluarix & Fluzone Quad PF) No Follow-up on file.

## 2015-10-14 NOTE — Assessment & Plan Note (Signed)
Resolved- normotensive today.  Likely due to acute illness.  No further work up or tx at this time.

## 2015-10-14 NOTE — Assessment & Plan Note (Signed)
Resolved- work up neg for MI, PE, and aortic dissection.  Likely due to bronchitis.  Symptoms resolved.

## 2015-10-14 NOTE — Assessment & Plan Note (Signed)
New-resolved.  Lungs clear on exam.  VSS. NO further work up or tx.

## 2015-10-14 NOTE — Progress Notes (Signed)
Pre visit review using our clinic review tool, if applicable. No additional management support is needed unless otherwise documented below in the visit note. 

## 2015-11-05 ENCOUNTER — Ambulatory Visit (INDEPENDENT_AMBULATORY_CARE_PROVIDER_SITE_OTHER): Payer: BLUE CROSS/BLUE SHIELD | Admitting: Family Medicine

## 2015-11-05 ENCOUNTER — Encounter: Payer: Self-pay | Admitting: Family Medicine

## 2015-11-05 ENCOUNTER — Other Ambulatory Visit: Payer: Self-pay

## 2015-11-05 VITALS — BP 138/84 | HR 77 | Temp 98.1°F | Wt 276.8 lb

## 2015-11-05 DIAGNOSIS — Z1231 Encounter for screening mammogram for malignant neoplasm of breast: Secondary | ICD-10-CM

## 2015-11-05 DIAGNOSIS — IMO0001 Reserved for inherently not codable concepts without codable children: Secondary | ICD-10-CM | POA: Insufficient documentation

## 2015-11-05 DIAGNOSIS — M609 Myositis, unspecified: Secondary | ICD-10-CM | POA: Diagnosis not present

## 2015-11-05 DIAGNOSIS — M791 Myalgia: Secondary | ICD-10-CM | POA: Diagnosis not present

## 2015-11-05 LAB — SEDIMENTATION RATE: Sed Rate: 32 mm/hr — ABNORMAL HIGH (ref 0–22)

## 2015-11-05 LAB — RHEUMATOID FACTOR

## 2015-11-05 LAB — C-REACTIVE PROTEIN: CRP: 2.3 mg/dL (ref 0.5–20.0)

## 2015-11-05 MED ORDER — MILNACIPRAN HCL 100 MG PO TABS: 50.0000 mg | ORAL_TABLET | Freq: Two times a day (BID) | ORAL | Status: DC

## 2015-11-05 MED ORDER — TRAMADOL HCL 50 MG PO TABS: 50.0000 mg | ORAL_TABLET | Freq: Three times a day (TID) | ORAL | Status: DC | PRN

## 2015-11-05 NOTE — Patient Instructions (Signed)
Good to see you. Take tramdol for SEVERE pain only.  I will call you with your lab results.  Please read about Savella.

## 2015-11-05 NOTE — Progress Notes (Signed)
Subjective:   Patient ID: Joan Russo, female    DOB: 12-18-1956, 59 y.o.   MRN: [id number]  Joan Russo is a pleasant 59 y.o. year old female who presents to clinic today with Fibromyalgia  on 11/05/2015  HPI:  Years of intermittent pain with just light touch- all over. Feels it is getting worse- now "any part of my body hurts when you touch it." "it's excruciating pain."  She has been taking Cymbalta 60 mg daily for years.  No known family h/o RA.  Current Outpatient Prescriptions on File Prior to Visit  Medication Sig Dispense Refill  . ALPRAZolam (XANAX) 0.5 MG tablet Take 1 tablet (0.5 mg total) by mouth at bedtime as needed for anxiety. (Patient taking differently: Take 0.5 mg by mouth daily as needed for anxiety. ) 30 tablet 0  . Biotin 5000 MCG CAPS Take 5,000 mcg by mouth daily.    . clobetasol cream (TEMOVATE) 4.09 % Apply 1 application topically 2 (two) times daily. X 6 wks, then drop to once daily x 6 wks, then 2x/wk (Patient taking differently: Apply 1 application topically once a week. Pt uses on Monday.) 30 g 5  . diclofenac (VOLTAREN) 75 MG EC tablet Take 75 mg by mouth 2 (two) times daily.    . DULoxetine (CYMBALTA) 60 MG capsule Take 60 mg by mouth daily.    . hydrochlorothiazide (MICROZIDE) 12.5 MG capsule Take 12.5 mg by mouth daily.    . Multiple Vitamin (MULTIVITAMIN WITH MINERALS) TABS tablet Take 1 tablet by mouth daily.    . pantoprazole (PROTONIX) 40 MG tablet Take 40 mg by mouth daily.     No current facility-administered medications on file prior to visit.    No Known Allergies  Past Medical History  Diagnosis Date  . Obesity, Class III, BMI 40-49.9 (morbid obesity) ([place])   . Anxiety and depression   . Vitamin D deficiency   . Urinary incontinence   . OA (osteoarthritis)   . Anxiety   . Depression   . Osteopenia   . GERD (gastroesophageal reflux disease)     Past Surgical History  Procedure Laterality Date  . 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 Hysteroscopy w/d&c N/A 01/16/2015    Procedure: DILATATION AND CURETTAGE /HYSTEROSCOPY;  Surgeon: Donnamae Jude, MD;  Location: [facility];  Service: Gynecology;  Laterality: N/A;    Family History  Problem Relation Age of Onset  . Cancer Mother 73    breast  . Cancer Daughter 59    breast  . Colon cancer Neg Hx   . Esophageal cancer Neg Hx   . Rectal cancer Neg Hx   . Stomach cancer Neg Hx   . Cancer Father     Social History   Social History  . Marital Status: Widowed    Spouse Name: N/A  . Number of Children: 4  . Years of Education: N/A   Occupational History  . Armed forces technical officer    Social History Main Topics  . Smoking status: Never Smoker   . Smokeless tobacco: Never Used  . Alcohol Use: No  . Drug Use: No  . Sexual Activity: Not on file   Other Topics Concern  . Not on file   Social History Narrative   The PMH, PSH, Social History, Family History, Medications, and allergies have been reviewed in [facility], and have been updated if relevant.      Review of Systems  Constitutional: Positive  for fatigue.  HENT: Negative.   Respiratory: Negative.   Cardiovascular: Negative.   Endocrine: Negative.   Genitourinary: Negative.   Musculoskeletal: Positive for myalgias, back pain and arthralgias. Negative for joint swelling, gait problem, neck pain and neck stiffness.  Skin: Negative.   Allergic/Immunologic: Negative.   Neurological: Negative.   Hematological: Negative.   Psychiatric/Behavioral: Negative.   All other systems reviewed and are negative.      Objective:    BP 138/84 mmHg  Pulse 77  Temp(Src) 98.1 F (36.7 C) (Oral)  Wt 276 lb 12 oz (125.533 kg)  SpO2 95%   Physical Exam  Constitutional: She is oriented to person, place, and time. No distress.  obese  HENT:  Head: Normocephalic.  Eyes: Conjunctivae are normal.  Cardiovascular: Normal rate.   Pulmonary/Chest: Effort normal.  Neurological: She is alert  and oriented to person, place, and time. No cranial nerve deficit.  + trigger points in all four quadrants  Skin: Skin is warm and dry. She is not diaphoretic.  Psychiatric: She has a normal mood and affect. Her behavior is normal. Thought content normal.  Nursing note and vitals reviewed.         Assessment & Plan:   Myalgia and myositis No Follow-up on file.

## 2015-11-05 NOTE — Progress Notes (Signed)
Pre visit review using our clinic review tool, if applicable. No additional management support is needed unless otherwise documented below in the visit note. 

## 2015-11-05 NOTE — Assessment & Plan Note (Signed)
>  25 minutes spent in face to face time with patient, >50% spent in counselling or coordination of care Does seem consistent with fibromyalgia. Discussed fibromyalgia work up and treatment with pt, given her handout about this along with supplements to take Check rheum labs today, if neg, consider starting Hyrum. RX given for tramadol to use for SEVERE pain only.  She is aware of potential of serotonin syndrome. Advised becoming more active as well. Orders Placed This Encounter  Procedures  . Rheumatoid Factor  . Cyclic Citrul Peptide Antibody, IGG  . Sedimentation Rate  . C-reactive protein   The patient indicates understanding of these issues and agrees with the plan.

## 2015-11-06 ENCOUNTER — Encounter: Payer: Self-pay | Admitting: Family Medicine

## 2015-11-06 LAB — CYCLIC CITRUL PEPTIDE ANTIBODY, IGG: Cyclic Citrullin Peptide Ab: <16 Units

## 2015-11-19 ENCOUNTER — Telehealth: Payer: Self-pay | Admitting: *Deleted

## 2015-11-19 MED ORDER — MILNACIPRAN HCL 12.5 & 25 & 50 MG PO MISC: ORAL | Status: DC

## 2015-11-19 NOTE — Telephone Encounter (Signed)
Spoke to pt and advised per Dr Aron.  

## 2015-11-19 NOTE — Telephone Encounter (Signed)
Pt contacted office stating she is wanting to try savella, after viewing labs on mychart

## 2015-11-19 NOTE — Telephone Encounter (Signed)
Noted.  eRx sent to pharmacy on file. Please use cautiously with tramadol as the combination of the two can cause something serious called serotonin syndrome.

## 2015-11-27 ENCOUNTER — Other Ambulatory Visit: Payer: Self-pay | Admitting: Family Medicine

## 2015-12-06 ENCOUNTER — Telehealth: Payer: Self-pay | Admitting: *Deleted

## 2015-12-06 ENCOUNTER — Ambulatory Visit: Payer: BLUE CROSS/BLUE SHIELD

## 2015-12-06 DIAGNOSIS — M797 Fibromyalgia: Secondary | ICD-10-CM

## 2015-12-06 NOTE — Telephone Encounter (Signed)
Fibromyalgia.  I have added it to her problem list.

## 2015-12-06 NOTE — Telephone Encounter (Signed)
PA completed via [email or web address] and a response is to be received within 3 business days

## 2015-12-06 NOTE — Telephone Encounter (Signed)
Form received indicating PA required for pts savella titration pack. What is the Dx for which pt was prescribed med?

## 2015-12-09 NOTE — Telephone Encounter (Signed)
[facility] left v/m requesting status of Savella titration pak prior auth.

## 2015-12-09 NOTE — Telephone Encounter (Signed)
Request is currently pending 

## 2015-12-10 NOTE — Addendum Note (Signed)
Addended by: Modena Nunnery on: 12/10/2015 04:23 PM   Modules accepted: Orders, Medications

## 2015-12-10 NOTE — Telephone Encounter (Addendum)
Response received indicating a denial for pts savella titration pack. Pt must first try and fail, on both cymbalta AND lyrica. Pt is currently taking cymbalta, but must also try lyrica. pls advise

## 2015-12-11 MED ORDER — PREGABALIN 75 MG PO CAPS: 75.0000 mg | ORAL_CAPSULE | Freq: Two times a day (BID) | ORAL | Status: DC

## 2015-12-11 NOTE — Telephone Encounter (Signed)
Pt left v/m requesting cb with update on Savella.

## 2015-12-11 NOTE — Telephone Encounter (Signed)
Rx called in to requested pharmacy 

## 2015-12-11 NOTE — Telephone Encounter (Signed)
We can certainly try Lyrica.  Please call is as entered.

## 2015-12-11 NOTE — Addendum Note (Signed)
Addended by: Lucille Passy on: 12/11/2015 04:04 PM   Modules accepted: Orders

## 2015-12-11 NOTE — Telephone Encounter (Signed)
Spoke to pt and advised her of denial. Pt is wanting to know if lyrica is going to be sent to the pharmacy

## 2015-12-12 NOTE — Telephone Encounter (Addendum)
Pt left v/m; pt picked up lyrica and pt wanted to clarify if pt needs to phase out the cymbalta or should pt take lyrica and cymbalta at the same time. Pt request cb. Dr Deborra Medina out of office; will send note to Dr Deborra Medina and Avie Echevaria NP.

## 2015-12-12 NOTE — Telephone Encounter (Signed)
She should take both together.

## 2015-12-12 NOTE — Telephone Encounter (Signed)
Spoke to pt and advised per Dr Aron.  

## 2016-01-02 ENCOUNTER — Ambulatory Visit: Payer: BLUE CROSS/BLUE SHIELD

## 2016-01-09 ENCOUNTER — Telehealth: Payer: Self-pay

## 2016-01-09 MED ORDER — PREGABALIN 75 MG PO CAPS: 75.0000 mg | ORAL_CAPSULE | Freq: Two times a day (BID) | ORAL | Status: DC

## 2016-01-09 NOTE — Telephone Encounter (Signed)
Ok to refill one month supply.  I would like for her to give this dose a little longer and then we can reassess.

## 2016-01-09 NOTE — Telephone Encounter (Signed)
Pt left v/m; pt has been taking lyrica 75 mg for fibromyalgia; has been effective superficially; helped slightly; pt is needing refill of lyrica and pt wants to know if can try a stronger dose of lyrica to [facility]. Pt request cb.

## 2016-01-09 NOTE — Telephone Encounter (Signed)
Rx called on to requested pharmacy. Lm on pts vm and advised per Dr Deborra Medina

## 2016-01-24 ENCOUNTER — Other Ambulatory Visit: Payer: Self-pay | Admitting: *Deleted

## 2016-01-24 MED ORDER — TRAMADOL HCL 50 MG PO TABS: 50.0000 mg | ORAL_TABLET | Freq: Three times a day (TID) | ORAL | Status: DC | PRN

## 2016-01-24 NOTE — Telephone Encounter (Signed)
Last f/u 10/2015

## 2016-01-24 NOTE — Telephone Encounter (Signed)
Rx called in to requested pharmacy 

## 2016-01-31 ENCOUNTER — Other Ambulatory Visit: Payer: Self-pay | Admitting: *Deleted

## 2016-01-31 MED ORDER — DICLOFENAC SODIUM 75 MG PO TBEC: 75.0000 mg | DELAYED_RELEASE_TABLET | Freq: Two times a day (BID) | ORAL | Status: DC

## 2016-01-31 MED ORDER — DULOXETINE HCL 60 MG PO CPEP: 60.0000 mg | ORAL_CAPSULE | Freq: Every day | ORAL | Status: DC

## 2016-01-31 NOTE — Telephone Encounter (Signed)
Spoke to pt and advised. Rx sent to requested pharmacy

## 2016-01-31 NOTE — Telephone Encounter (Signed)
Fax received requesting voltaren and cymbalta. Cymbalta was removed from med list at last OV. Per pt chart, RBaity advised to take both Lyrica and Cymbalta. Spoke to pt who is wanting to confirm if she should continue taking both. If so she is needing a new Rx

## 2016-01-31 NOTE — Telephone Encounter (Signed)
Yes she can take both and ok to refill as requested.

## 2016-02-10 ENCOUNTER — Other Ambulatory Visit: Payer: Self-pay | Admitting: Family Medicine

## 2016-02-10 NOTE — Telephone Encounter (Signed)
Rx called to pharmacy as instructed. 

## 2016-02-10 NOTE — Telephone Encounter (Signed)
Received refill request electronically Last refill 01/09/16 #60 Last office visit 11/05/15 Is it okay to refill?

## 2016-02-22 ENCOUNTER — Other Ambulatory Visit: Payer: Self-pay | Admitting: Family Medicine

## 2016-03-10 ENCOUNTER — Ambulatory Visit (INDEPENDENT_AMBULATORY_CARE_PROVIDER_SITE_OTHER): Payer: BLUE CROSS/BLUE SHIELD | Admitting: Family Medicine

## 2016-03-10 VITALS — BP 124/82 | HR 70 | Temp 97.5°F | Wt 283.2 lb

## 2016-03-10 DIAGNOSIS — M545 Low back pain, unspecified: Secondary | ICD-10-CM

## 2016-03-10 LAB — POC URINALSYSI DIPSTICK (AUTOMATED)
BILIRUBIN UA: NEGATIVE
GLUCOSE UA: NEGATIVE
KETONES UA: NEGATIVE
Leukocytes, UA: NEGATIVE
Nitrite, UA: NEGATIVE
PH UA: 7
Protein, UA: NEGATIVE
RBC UA: NEGATIVE
SPEC GRAV UA: 1.01
Urobilinogen, UA: 0.2

## 2016-03-10 MED ORDER — ALPRAZOLAM 0.5 MG PO TABS: 0.5000 mg | ORAL_TABLET | Freq: Every evening | ORAL | Status: DC | PRN

## 2016-03-10 NOTE — Progress Notes (Deleted)
Subjective:   Patient ID: [name], female    DOB: [birth year], 60 y.o.   MRN: [id number]  DEMETRUS TRAUTH is a pleasant 60 y.o. year old female who presents to clinic today with Back Pain  on 03/10/2016  HPI: ***  Current Outpatient Prescriptions on File Prior to Visit  Medication Sig Dispense Refill  . ALPRAZolam (XANAX) 0.5 MG tablet Take 1 tablet (0.5 mg total) by mouth at bedtime as needed for anxiety. (Patient taking differently: Take 0.5 mg by mouth daily as needed for anxiety. ) 30 tablet 0  . clobetasol cream (TEMOVATE) AB-123456789 % Apply 1 application topically 2 (two) times daily. X 6 wks, then drop to once daily x 6 wks, then 2x/wk (Patient taking differently: Apply 1 application topically once a week. Pt uses on Monday.) 30 g 5  . diclofenac (VOLTAREN) 75 MG EC tablet Take 1 tablet (75 mg total) by mouth 2 (two) times daily. 180 tablet 1  . DULoxetine (CYMBALTA) 60 MG capsule Take 1 capsule (60 mg total) by mouth daily. 90 capsule 1  . hydrochlorothiazide (MICROZIDE) 12.5 MG capsule TAKE ONE CAPSULE BY MOUTH EVERY DAY 30 capsule 5  . LYRICA 75 MG capsule TAKE 1 CAPSULE BY MOUTH TWICE DAILY 60 capsule 0  . Milnacipran HCl (SAVELLA TITRATION PACK) 12.5 & 25 & 50 MG MISC 12.5 mg once on day 1, then 12.5 mg twice daily on days 2-3, 25 mg twice daily on days 4-7, then 50 mg twice daily thereafter 55 each 0  . Multiple Vitamin (MULTIVITAMIN WITH MINERALS) TABS tablet Take 1 tablet by mouth daily.    . pantoprazole (PROTONIX) 40 MG tablet TAKE 1 TABLET BY MOUTH EVERY DAY 30 tablet 4  . traMADol (ULTRAM) 50 MG tablet Take 1 tablet (50 mg total) by mouth every 8 (eight) hours as needed. 30 tablet 0   No current facility-administered medications on file prior to visit.    No Known Allergies  Past Medical History  Diagnosis Date  . Obesity, Class III, BMI 40-49.9 (morbid obesity) ([place])   . Anxiety and depression   . Vitamin D deficiency   . Urinary incontinence   . OA  (osteoarthritis)   . Anxiety   . Depression   . Osteopenia   . GERD (gastroesophageal reflux disease)     Past Surgical History  Procedure Laterality Date  . Total knee arthroplasty  08/2008    Left  . Hysteroscopy w/d&c N/A 01/16/2015    Procedure: DILATATION AND CURETTAGE /HYSTEROSCOPY;  Surgeon: Donnamae Jude, MD;  Location: [facility];  Service: Gynecology;  Laterality: N/A;    Family History  Problem Relation Age of Onset  . Cancer Mother 102    breast  . Cancer Daughter 75    breast  . Colon cancer Neg Hx   . Esophageal cancer Neg Hx   . Rectal cancer Neg Hx   . Stomach cancer Neg Hx   . Cancer Father     Social History   Social History  . Marital Status: Widowed    Spouse Name: N/A  . Number of Children: 4  . Years of Education: N/A   Occupational History  . Armed forces technical officer    Social History Main Topics  . Smoking status: Never Smoker   . Smokeless tobacco: Never Used  . Alcohol Use: No  . Drug Use: No  . Sexual Activity: Not on file   Other Topics Concern  . Not on file  Social History Narrative   The PMH, PSH, Social History, Family History, Medications, and allergies have been reviewed in [facility], and have been updated if relevant.  Review of Systems     Objective:    There were no vitals taken for this visit.   Physical Exam        Assessment & Plan:   No diagnosis found. No Follow-up on file.

## 2016-03-10 NOTE — Progress Notes (Signed)
SUBJECTIVE:  Joan Russo is a 60 y.o. female who complains of low back pain for 3 day(s), positional with bending or lifting, without radiation down the legs. Precipitating factors: none recalled by the patient. Prior history of back problems: recurrent self limited episodes of low back pain in the past. There is no numbness in the legs.  Current Outpatient Prescriptions on File Prior to Visit  Medication Sig Dispense Refill  . ALPRAZolam (XANAX) 0.5 MG tablet Take 1 tablet (0.5 mg total) by mouth at bedtime as needed for anxiety. (Patient taking differently: Take 0.5 mg by mouth daily as needed for anxiety. ) 30 tablet 0  . clobetasol cream (TEMOVATE) AB-123456789 % Apply 1 application topically 2 (two) times daily. X 6 wks, then drop to once daily x 6 wks, then 2x/wk (Patient taking differently: Apply 1 application topically once a week. Pt uses on Monday.) 30 g 5  . diclofenac (VOLTAREN) 75 MG EC tablet Take 1 tablet (75 mg total) by mouth 2 (two) times daily. 180 tablet 1  . DULoxetine (CYMBALTA) 60 MG capsule Take 1 capsule (60 mg total) by mouth daily. 90 capsule 1  . hydrochlorothiazide (MICROZIDE) 12.5 MG capsule TAKE ONE CAPSULE BY MOUTH EVERY DAY 30 capsule 5  . LYRICA 75 MG capsule TAKE 1 CAPSULE BY MOUTH TWICE DAILY 60 capsule 0  . Milnacipran HCl (SAVELLA TITRATION PACK) 12.5 & 25 & 50 MG MISC 12.5 mg once on day 1, then 12.5 mg twice daily on days 2-3, 25 mg twice daily on days 4-7, then 50 mg twice daily thereafter 55 each 0  . Multiple Vitamin (MULTIVITAMIN WITH MINERALS) TABS tablet Take 1 tablet by mouth daily.    . pantoprazole (PROTONIX) 40 MG tablet TAKE 1 TABLET BY MOUTH EVERY DAY 30 tablet 4  . traMADol (ULTRAM) 50 MG tablet Take 1 tablet (50 mg total) by mouth every 8 (eight) hours as needed. 30 tablet 0   No current facility-administered medications on file prior to visit.    No Known Allergies  Past Medical History  Diagnosis Date  . Obesity, Class III, BMI 40-49.9  (morbid obesity) ([place])   . Anxiety and depression   . Vitamin D deficiency   . Urinary incontinence   . OA (osteoarthritis)   . Anxiety   . Depression   . Osteopenia   . GERD (gastroesophageal reflux disease)     Past Surgical History  Procedure Laterality Date  . Total knee arthroplasty  08/2008    Left  . Hysteroscopy w/d&c N/A 01/16/2015    Procedure: DILATATION AND CURETTAGE /HYSTEROSCOPY;  Surgeon: Donnamae Jude, MD;  Location: [facility];  Service: Gynecology;  Laterality: N/A;    Family History  Problem Relation Age of Onset  . Cancer Mother 94    breast  . Cancer Daughter 59    breast  . Colon cancer Neg Hx   . Esophageal cancer Neg Hx   . Rectal cancer Neg Hx   . Stomach cancer Neg Hx   . Cancer Father     Social History   Social History  . Marital Status: Widowed    Spouse Name: N/A  . Number of Children: 4  . Years of Education: N/A   Occupational History  . Armed forces technical officer    Social History Main Topics  . Smoking status: Never Smoker   . Smokeless tobacco: Never Used  . Alcohol Use: No  . Drug Use: No  . Sexual Activity: Not on file  Other Topics Concern  . Not on file   Social History Narrative   The PMH, PSH, Social History, Family History, Medications, and allergies have been reviewed in [facility], and have been updated if relevant.  OBJECTIVE: BP 124/82 mmHg  Pulse 70  Temp(Src) 97.5 F (36.4 C) (Oral)  Wt 283 lb 4 oz (128.481 kg)  SpO2 98%  Patient appears to be in mild to moderate pain, antalgic gait noted. Lumbosacral spine area reveals no local tenderness or mass.  Painful and reduced LS ROM noted. Straight leg raise is negative at 90 degrees on bilateral. DTR's, motor strength and sensation normal, including heel and toe gait.  Peripheral pulses are palpable. X-Ray: not indicated.  ASSESSMENT:  lumbar strain and degenerative disc disease without herniated disc  PLAN: For acute pain, rest, intermittent application of heat (do not  sleep on heating pad), analgesics and muscle relaxants are recommended. Discussed longer term treatment plan of prn NSAID's and discussed a home back care exercise program with flexion exercise routine. Proper lifting with avoidance of heavy lifting discussed. Consider Physical Therapy and XRay studies if not improving. Call or return to clinic prn if these symptoms worsen or fail to improve as anticipated.

## 2016-03-10 NOTE — Addendum Note (Signed)
Addended by: Modena Nunnery on: 03/10/2016 07:53 AM   Modules accepted: Orders

## 2016-03-10 NOTE — Addendum Note (Signed)
Addended by: Lucille Passy on: 03/10/2016 07:51 AM   Modules accepted: Orders

## 2016-03-10 NOTE — Progress Notes (Signed)
Pre visit review using our clinic review tool, if applicable. No additional management support is needed unless otherwise documented below in the visit note. 

## 2016-03-15 ENCOUNTER — Other Ambulatory Visit: Payer: Self-pay | Admitting: Family Medicine

## 2016-03-17 ENCOUNTER — Other Ambulatory Visit: Payer: Self-pay | Admitting: Family Medicine

## 2016-03-18 NOTE — Telephone Encounter (Signed)
Last f/u 10/2015

## 2016-03-18 NOTE — Telephone Encounter (Signed)
Rx called in as directed.   

## 2016-03-31 DIAGNOSIS — G4733 Obstructive sleep apnea (adult) (pediatric): Secondary | ICD-10-CM | POA: Diagnosis not present

## 2016-03-31 DIAGNOSIS — M1711 Unilateral primary osteoarthritis, right knee: Secondary | ICD-10-CM | POA: Diagnosis not present

## 2016-03-31 DIAGNOSIS — M25532 Pain in left wrist: Secondary | ICD-10-CM | POA: Diagnosis not present

## 2016-03-31 DIAGNOSIS — Z96652 Presence of left artificial knee joint: Secondary | ICD-10-CM | POA: Diagnosis not present

## 2016-04-22 ENCOUNTER — Other Ambulatory Visit: Payer: Self-pay | Admitting: Family Medicine

## 2016-04-22 NOTE — Telephone Encounter (Signed)
rx called in to requested pharmacy 

## 2016-04-22 NOTE — Telephone Encounter (Signed)
Last f/u 10/2015

## 2016-05-12 ENCOUNTER — Ambulatory Visit (INDEPENDENT_AMBULATORY_CARE_PROVIDER_SITE_OTHER): Payer: BLUE CROSS/BLUE SHIELD | Admitting: Family Medicine

## 2016-05-12 ENCOUNTER — Encounter: Payer: Self-pay | Admitting: Family Medicine

## 2016-05-12 ENCOUNTER — Other Ambulatory Visit: Payer: Self-pay | Admitting: Family Medicine

## 2016-05-12 VITALS — BP 132/74 | HR 78 | Temp 97.6°F | Wt 272.5 lb

## 2016-05-12 DIAGNOSIS — R490 Dysphonia: Secondary | ICD-10-CM | POA: Diagnosis not present

## 2016-05-12 DIAGNOSIS — R1319 Other dysphagia: Secondary | ICD-10-CM | POA: Insufficient documentation

## 2016-05-12 DIAGNOSIS — J069 Acute upper respiratory infection, unspecified: Secondary | ICD-10-CM | POA: Diagnosis not present

## 2016-05-12 DIAGNOSIS — R1310 Dysphagia, unspecified: Secondary | ICD-10-CM

## 2016-05-12 NOTE — Patient Instructions (Signed)
Great to see you. We are referring you to a gastroenterologist.  Continue taking your protonix.

## 2016-05-12 NOTE — Assessment & Plan Note (Signed)
Progressive with intermittently associated hoarseness. Refer to GI for endoscopy- re:  Rule out esophageal stricture, malignancy (less likely). If neg, ?ENT referral to evaluate vocal cords. Continue PPI. The patient indicates understanding of these issues and agrees with the plan. Orders Placed This Encounter  Procedures  . Ambulatory referral to Gastroenterology

## 2016-05-12 NOTE — Progress Notes (Signed)
Subjective:   Patient ID: Joan Russo, female    DOB: Jun 23, 1956, 60 y.o.   MRN: [id number]  Joan Russo is a pleasant 60 y.o. year old female who presents to clinic today with Hoarse  on 05/12/2016  HPI:  Over a year of progressive hoarseness and difficulty swallowing. Hoarseness seems worse in the morning or when she is sick.  Now noticing that she is getting "choked" on foods and liquids.  No pain with swallowing.  She is not a smoker and does not drink ETOH.  She does take protonix 40 mg daily which has not seemed to help with these symptoms.  It does help with her reflux symptoms.  Current Outpatient Prescriptions on File Prior to Visit  Medication Sig Dispense Refill  . ALPRAZolam (XANAX) 0.5 MG tablet Take 1 tablet (0.5 mg total) by mouth at bedtime as needed for anxiety. 30 tablet 0  . clobetasol cream (TEMOVATE) AB-123456789 % Apply 1 application topically 2 (two) times daily. X 6 wks, then drop to once daily x 6 wks, then 2x/wk (Patient taking differently: Apply 1 application topically once a week. Pt uses on Monday.) 30 g 5  . diclofenac (VOLTAREN) 75 MG EC tablet Take 1 tablet (75 mg total) by mouth 2 (two) times daily. 180 tablet 1  . DULoxetine (CYMBALTA) 60 MG capsule Take 1 capsule (60 mg total) by mouth daily. 90 capsule 1  . hydrochlorothiazide (MICROZIDE) 12.5 MG capsule TAKE ONE CAPSULE BY MOUTH EVERY DAY 30 capsule 5  . LYRICA 75 MG capsule TAKE 1 CAPSULE BY MOUTH TWICE DAILY 60 capsule 0  . Milnacipran HCl (SAVELLA TITRATION PACK) 12.5 & 25 & 50 MG MISC 12.5 mg once on day 1, then 12.5 mg twice daily on days 2-3, 25 mg twice daily on days 4-7, then 50 mg twice daily thereafter 55 each 0  . Multiple Vitamin (MULTIVITAMIN WITH MINERALS) TABS tablet Take 1 tablet by mouth daily.    . pantoprazole (PROTONIX) 40 MG tablet TAKE 1 TABLET BY MOUTH EVERY DAY 30 tablet 4  . traMADol (ULTRAM) 50 MG tablet Take 1 tablet (50 mg total) by mouth every 8 (eight) hours as  needed. 30 tablet 0   No current facility-administered medications on file prior to visit.    No Known Allergies  Past Medical History  Diagnosis Date  . Obesity, Class III, BMI 40-49.9 (morbid obesity) ([place])   . Anxiety and depression   . Vitamin D deficiency   . Urinary incontinence   . OA (osteoarthritis)   . Anxiety   . Depression   . Osteopenia   . GERD (gastroesophageal reflux disease)     Past Surgical History  Procedure Laterality Date  . Total knee arthroplasty  08/2008    Left  . Hysteroscopy w/d&c N/A 01/16/2015    Procedure: DILATATION AND CURETTAGE /HYSTEROSCOPY;  Surgeon: Donnamae Jude, MD;  Location: [facility];  Service: Gynecology;  Laterality: N/A;    Family History  Problem Relation Age of Onset  . Cancer Mother 62    breast  . Cancer Daughter 2    breast  . Colon cancer Neg Hx   . Esophageal cancer Neg Hx   . Rectal cancer Neg Hx   . Stomach cancer Neg Hx   . Cancer Father     Social History   Social History  . Marital Status: Widowed    Spouse Name: N/A  . Number of Children: 4  . Years of Education:  N/A   Occupational History  . Armed forces technical officer    Social History Main Topics  . Smoking status: Never Smoker   . Smokeless tobacco: Never Used  . Alcohol Use: No  . Drug Use: No  . Sexual Activity: Not on file   Other Topics Concern  . Not on file   Social History Narrative   The PMH, PSH, Social History, Family History, Medications, and allergies have been reviewed in [facility], and have been updated if relevant.   Review of Systems  Constitutional: Negative.   HENT: Positive for trouble swallowing and voice change. Negative for rhinorrhea, sinus pressure, sneezing and sore throat.   Respiratory: Negative.   Gastrointestinal: Negative.   Musculoskeletal: Negative.   Skin: Negative.   Neurological: Negative.   Psychiatric/Behavioral: Negative.   All other systems reviewed and are negative.      Objective:    BP 132/74 mmHg   Pulse 78  Temp(Src) 97.6 F (36.4 C) (Oral)  Wt 272 lb 8 oz (123.605 kg)  SpO2 92%   Physical Exam  Constitutional: She is oriented to person, place, and time. She appears well-developed and well-nourished. No distress.  +some hoarseness in her voice today  HENT:  Head: Normocephalic.  Eyes: Conjunctivae are normal.  Cardiovascular: Normal rate.   Pulmonary/Chest: Effort normal.  Musculoskeletal: Normal range of motion.  Neurological: She is alert and oriented to person, place, and time. No cranial nerve deficit.  Skin: Skin is warm and dry. She is not diaphoretic.  Psychiatric: She has a normal mood and affect. Her behavior is normal. Judgment and thought content normal.  Nursing note and vitals reviewed.         Assessment & Plan:   Acute upper respiratory infection  Hoarse  Dysphagia No Follow-up on file.

## 2016-05-12 NOTE — Progress Notes (Signed)
Pre visit review using our clinic review tool, if applicable. No additional management support is needed unless otherwise documented below in the visit note. 

## 2016-05-20 ENCOUNTER — Other Ambulatory Visit: Payer: Self-pay | Admitting: Family Medicine

## 2016-05-21 DIAGNOSIS — H0015 Chalazion left lower eyelid: Secondary | ICD-10-CM | POA: Diagnosis not present

## 2016-05-21 DIAGNOSIS — H52203 Unspecified astigmatism, bilateral: Secondary | ICD-10-CM | POA: Diagnosis not present

## 2016-05-21 DIAGNOSIS — H43813 Vitreous degeneration, bilateral: Secondary | ICD-10-CM | POA: Diagnosis not present

## 2016-05-21 DIAGNOSIS — H18602 Keratoconus, unspecified, left eye: Secondary | ICD-10-CM | POA: Diagnosis not present

## 2016-05-22 NOTE — Telephone Encounter (Signed)
Last f/u 10/2015

## 2016-05-23 ENCOUNTER — Other Ambulatory Visit: Payer: Self-pay | Admitting: Family Medicine

## 2016-05-26 NOTE — Telephone Encounter (Signed)
Rx called in to requested pharmacy 

## 2016-06-01 ENCOUNTER — Encounter: Payer: Self-pay | Admitting: *Deleted

## 2016-06-01 ENCOUNTER — Ambulatory Visit (INDEPENDENT_AMBULATORY_CARE_PROVIDER_SITE_OTHER): Payer: BLUE CROSS/BLUE SHIELD | Admitting: Internal Medicine

## 2016-06-01 VITALS — BP 134/82 | HR 80 | Ht <= 58 in | Wt 278.4 lb

## 2016-06-01 DIAGNOSIS — R490 Dysphonia: Secondary | ICD-10-CM | POA: Diagnosis not present

## 2016-06-01 DIAGNOSIS — Z86010 Personal history of colonic polyps: Secondary | ICD-10-CM | POA: Diagnosis not present

## 2016-06-01 DIAGNOSIS — R1310 Dysphagia, unspecified: Secondary | ICD-10-CM

## 2016-06-01 DIAGNOSIS — K219 Gastro-esophageal reflux disease without esophagitis: Secondary | ICD-10-CM

## 2016-06-01 NOTE — Progress Notes (Signed)
Patient ID: Joan Russo, female   DOB: 1956/10/14, 60 y.o.   MRN: [id number] HPI: Joan Russo is a 60 year old female with a history of GERD, adenomatous colon polyps, diverticulosis who is seen in consultation at the request of Dr. Deborra Medina to evaluate dysphagia. She is here alone today. She reports 18 months of hoarseness and intermittent solid food dysphagia. Reflux has been well controlled on pantoprazole 40 mg daily. With this medication she has no heartburn. She denies odynophagia. She reports with solid foods particular breads and meats she feels like food "hangs up" in her mid chest. She has not had to regurgitate food as the food usually works its way down. This can occur with liquids if she takes "large gulps". No change in appetite, nausea or vomiting. No abdominal pain. She has been taking Lyrica for the past 18 months and this medication initiation coincides with her symptoms. She uses CPAP nightly for sleep apnea.  Records reviewed which reveals colonoscopy performed January 2013. 3 polyps removed from the ascending colon, 2 from the rectosigmoid colon. 2 of these polyps were found to be precancerous, 1 tubular adenoma and one sessile serrated polyp. The other polyps were benign colorectal mucosa with lymphoid aggregate or hyperplastic polyp or prolapse-type polyp. 5 year recall colonoscopy was recommended.  She denies a family history of Barrett's esophagus or esophageal cancer. Her mother and daughter have had breast cancer  Past Medical History  Diagnosis Date  . Obesity, Class III, BMI 40-49.9 (morbid obesity) ([place])   . Anxiety and depression   . Vitamin D deficiency   . Urinary incontinence   . OA (osteoarthritis)   . Anxiety   . Depression   . Osteopenia   . GERD (gastroesophageal reflux disease)   . Tubular adenoma   . Internal hemorrhoids   . Diverticulosis     Past Surgical History  Procedure Laterality Date  . Total knee arthroplasty  08/2008    Left  .  Hysteroscopy w/d&c N/A 01/16/2015    Procedure: DILATATION AND CURETTAGE /HYSTEROSCOPY;  Surgeon: Donnamae Jude, MD;  Location: [facility];  Service: Gynecology;  Laterality: N/A;    Outpatient Prescriptions Prior to Visit  Medication Sig Dispense Refill  . ALPRAZolam (XANAX) 0.5 MG tablet Take 1 tablet (0.5 mg total) by mouth at bedtime as needed for anxiety. 30 tablet 0  . clobetasol cream (TEMOVATE) AB-123456789 % Apply 1 application topically 2 (two) times daily. X 6 wks, then drop to once daily x 6 wks, then 2x/wk (Patient taking differently: Apply 1 application topically once a week. Pt uses on Monday.) 30 g 5  . diclofenac (VOLTAREN) 75 MG EC tablet Take 1 tablet (75 mg total) by mouth 2 (two) times daily. 180 tablet 1  . DULoxetine (CYMBALTA) 60 MG capsule Take 1 capsule (60 mg total) by mouth daily. 90 capsule 1  . hydrochlorothiazide (MICROZIDE) 12.5 MG capsule TAKE ONE CAPSULE BY MOUTH EVERY DAY 30 capsule 2  . LYRICA 75 MG capsule TAKE 1 CAPSULE BY MOUTH 2 TIMES A DAY 60 capsule 0  . Milnacipran HCl (SAVELLA TITRATION PACK) 12.5 & 25 & 50 MG MISC 12.5 mg once on day 1, then 12.5 mg twice daily on days 2-3, 25 mg twice daily on days 4-7, then 50 mg twice daily thereafter (Patient not taking: Reported on 06/01/2016) 55 each 0  . Multiple Vitamin (MULTIVITAMIN WITH MINERALS) TABS tablet Take 1 tablet by mouth daily.    . pantoprazole (PROTONIX) 40 MG tablet TAKE  1 TABLET BY MOUTH EVERY DAY 30 tablet 4  . traMADol (ULTRAM) 50 MG tablet Take 1 tablet (50 mg total) by mouth every 8 (eight) hours as needed. (Patient not taking: Reported on 06/01/2016) 30 tablet 0   No facility-administered medications prior to visit.    No Known Allergies  Family History  Problem Relation Age of Onset  . Cancer Mother 25    breast  . Cancer Daughter 31    breast  . Colon cancer Neg Hx   . Esophageal cancer Neg Hx   . Rectal cancer Neg Hx   . Stomach cancer Neg Hx   . Cancer Father     Social History   Substance Use Topics  . Smoking status: Never Smoker   . Smokeless tobacco: Never Used  . Alcohol Use: No    ROS: As per history of present illness, otherwise negative  BP 134/82 mmHg  Pulse 80  Ht 4\' 10"  (1.473 m)  Wt 278 lb 6.4 oz (126.281 kg)  BMI 58.20 kg/m2 Constitutional: Well-developed and well-nourished. No distress. HEENT: Normocephalic and atraumatic. Oropharynx is clear and moist. No oropharyngeal exudate. Conjunctivae are normal.  No scleral icterus. Neck: Neck supple. Trachea midline. Cardiovascular: Normal rate, regular rhythm and intact distal pulses. No M/R/G Pulmonary/chest: Effort normal and breath sounds normal. No wheezing, rales or rhonchi. Abdominal: Soft, nontender, nondistended. Bowel sounds active throughout. There are no masses palpable. No hepatosplenomegaly. Extremities: no clubbing, cyanosis, or edema Lymphadenopathy: No cervical adenopathy noted. Neurological: Alert and oriented to person place and time. Skin: Skin is warm and dry. No rashes noted. Psychiatric: Normal mood and affect. Behavior is normal.  RELEVANT LABS AND IMAGING: CBC    Component Value Date/Time   WBC 10.9 10/04/2015 1114   RBC 4.84 10/04/2015 1114   HGB 14.3 10/04/2015 1114   HCT 43.2 10/04/2015 1114   PLT 270 10/04/2015 1114   MCV 89.2 10/04/2015 1114   MCH 29.5 10/04/2015 1114   MCHC 33.1 10/04/2015 1114   RDW 14.0 10/04/2015 1114   LYMPHSABS 4.3* 10/13/2011 1143   MONOABS 0.5 10/13/2011 1143   EOSABS 0.3 10/13/2011 1143   BASOSABS 0.0 10/13/2011 1143    CMP     Component Value Date/Time   NA 142 10/04/2015 1114   K 3.6 10/04/2015 1114   CL 105 10/04/2015 1114   CO2 28 10/04/2015 1114   GLUCOSE 121* 10/04/2015 1114   BUN 20 10/04/2015 1114   CREATININE 0.87 10/04/2015 1114   CALCIUM 9.7 10/04/2015 1114   PROT 7.4 05/08/2013 0812   ALBUMIN 3.8 05/08/2013 0812   AST 34 05/08/2013 0812   ALT 49* 05/08/2013 0812   ALKPHOS 78 05/08/2013 0812   BILITOT 0.4  05/08/2013 0812   GFRNONAA >60 10/04/2015 1114   GFRAA >60 10/04/2015 1114    ASSESSMENT/PLAN: 60 year old female with a history of GERD, adenomatous colon polyps, diverticulosis who is seen in consultation at the request of Dr. Deborra Medina to evaluate dysphagia.  1. Intermittent solid food dysphagia and hoarseness -- she reports that with PPI her heartburn has resolved but she has continued to have hoarseness. Dysphagia has been present for approximately 18 months. I recommended a barium swallow with tablet followed by upper endoscopy. Also recommended that she humidify the air when using CPAP which may help with some of her hoarseness and dry throat symptoms. She will continue pantoprazole 40 mg daily for GERD.  2. History of colon polyps -- surveillance colonoscopy recommended January 2018  Procedures will  need to be performed in the outpatient hospital setting due to BMI.     [id number] M Aron, Md [place], [place] [id number]

## 2016-06-01 NOTE — Patient Instructions (Addendum)
Please continue Protonix.  Add humidity to your CPAP machine.  You will be due for a recall colonoscopy in 12/2016. We will send you a reminder in the mail when it gets closer to that time.  You have been scheduled for a Barium Esophogram at [facility] (1st floor of the hospital) on Tuesday, 06/09/16 at 11:15 am. Please arrive 15 minutes prior to your appointment for registration. Make certain not to have anything to eat or drink 6 hours prior to your test. If you need to reschedule for any reason, please contact radiology at [phone] to do so. __________________________________________________________________ A barium swallow is an examination that concentrates on views of the esophagus. This tends to be a double contrast exam (barium and two liquids which, when combined, create a gas to distend the wall of the oesophagus) or single contrast (non-ionic iodine based). The study is usually tailored to your symptoms so a good history is essential. Attention is paid during the study to the form, structure and configuration of the esophagus, looking for functional disorders (such as aspiration, dysphagia, achalasia, motility and reflux) EXAMINATION You may be asked to change into a gown, depending on the type of swallow being performed. A radiologist and radiographer will perform the procedure. The radiologist will advise you of the type of contrast selected for your procedure and direct you during the exam. You will be asked to stand, sit or lie in several different positions and to hold a small amount of fluid in your mouth before being asked to swallow while the imaging is performed .In some instances you may be asked to swallow barium coated marshmallows to assess the motility of a solid food bolus. The exam can be recorded as a digital or video fluoroscopy procedure. POST PROCEDURE It will take 1-2 days for the barium to pass through your system. To facilitate this, it is important,  unless otherwise directed, to increase your fluids for the next 24-48hrs and to resume your normal diet.  This test typically takes about 30 minutes to perform. __________________________________________________________________________________  If you are age 88 or older, your body mass index should be between 23-30. Your Body mass index is 58.2 kg/(m^2). If this is out of the aforementioned range listed, please consider follow up with your Primary Care Provider.  If you are age 74 or younger, your body mass index should be between 19-25. Your Body mass index is 58.2 kg/(m^2). If this is out of the aformentioned range listed, please consider follow up with your Primary Care Provider.

## 2016-06-08 ENCOUNTER — Telehealth: Payer: Self-pay | Admitting: Family Medicine

## 2016-06-08 ENCOUNTER — Encounter: Payer: Self-pay | Admitting: Primary Care

## 2016-06-08 ENCOUNTER — Ambulatory Visit (INDEPENDENT_AMBULATORY_CARE_PROVIDER_SITE_OTHER): Payer: BLUE CROSS/BLUE SHIELD | Admitting: Primary Care

## 2016-06-08 ENCOUNTER — Ambulatory Visit
Admission: RE | Admit: 2016-06-08 | Discharge: 2016-06-08 | Disposition: A | Discharge status: Home / Self Care | Payer: BLUE CROSS/BLUE SHIELD | Source: Ambulatory Visit | Attending: Primary Care | Admitting: Primary Care

## 2016-06-08 VITALS — BP 130/74 | HR 68 | Temp 97.9°F | Ht <= 58 in | Wt 279.1 lb

## 2016-06-08 DIAGNOSIS — H532 Diplopia: Secondary | ICD-10-CM

## 2016-06-08 DIAGNOSIS — F418 Other specified anxiety disorders: Secondary | ICD-10-CM

## 2016-06-08 DIAGNOSIS — G319 Degenerative disease of nervous system, unspecified: Secondary | ICD-10-CM | POA: Diagnosis not present

## 2016-06-08 DIAGNOSIS — F32A Depression, unspecified: Secondary | ICD-10-CM

## 2016-06-08 DIAGNOSIS — R51 Headache: Secondary | ICD-10-CM | POA: Diagnosis not present

## 2016-06-08 DIAGNOSIS — F419 Anxiety disorder, unspecified: Secondary | ICD-10-CM

## 2016-06-08 DIAGNOSIS — F329 Major depressive disorder, single episode, unspecified: Secondary | ICD-10-CM

## 2016-06-08 DIAGNOSIS — R410 Disorientation, unspecified: Secondary | ICD-10-CM | POA: Diagnosis not present

## 2016-06-08 LAB — COMPREHENSIVE METABOLIC PANEL
ALK PHOS: 69 U/L (ref 39–117)
ALT: 38 U/L — AB (ref 0–35)
AST: 28 U/L (ref 0–37)
Albumin: 4.2 g/dL (ref 3.5–5.2)
BUN: 20 mg/dL (ref 6–23)
CO2: 33 meq/L — AB (ref 19–32)
Calcium: 9.7 mg/dL (ref 8.4–10.5)
Chloride: 102 mEq/L (ref 96–112)
Creatinine, Ser: 0.84 mg/dL (ref 0.40–1.20)
GFR: 72.98 mL/min (ref >60.00)
GLUCOSE: 98 mg/dL (ref 70–99)
POTASSIUM: 4.3 meq/L (ref 3.5–5.1)
SODIUM: 142 meq/L (ref 135–145)
TOTAL PROTEIN: 7.3 g/dL (ref 6.0–8.3)
Total Bilirubin: 0.5 mg/dL (ref 0.2–1.2)

## 2016-06-08 LAB — LIPID PANEL
CHOL/HDL RATIO: 4
Cholesterol: 223 mg/dL — ABNORMAL HIGH (ref 0–200)
HDL: 52.2 mg/dL (ref >39.00)
LDL Cholesterol: 151 mg/dL — ABNORMAL HIGH (ref 0–99)
NONHDL: 170.91
TRIGLYCERIDES: 98 mg/dL (ref 0.0–149.0)
VLDL: 19.6 mg/dL (ref 0.0–40.0)

## 2016-06-08 LAB — TSH: TSH: 3.69 u[IU]/mL (ref 0.35–4.50)

## 2016-06-08 LAB — HEMOGLOBIN A1C: Hgb A1c MFr Bld: 5.8 % (ref 4.6–6.5)

## 2016-06-08 NOTE — Telephone Encounter (Signed)
Noted  

## 2016-06-08 NOTE — Assessment & Plan Note (Addendum)
Managed on Cymbalta 60 mg, Lyrica 75 mg, alprazolam. PH Q9 score of 11 today. Increased tearfulness and is unsure why. She states that life is great and has no complaints.  Do not believe that a change in medication is necessary at this point as symptoms have been present for 3 weeks. Denies SI/HI. If symptoms do persist, will have her follow-up with her PCP to discuss further treatment. Patient is agreeable to the plan as she does not wish to add another medication at this time. Declines therapy.

## 2016-06-08 NOTE — Progress Notes (Signed)
Subjective:    Patient ID: Joan Russo, female    DOB: 1956-02-28, 60 y.o.   MRN: [id number]  HPI  Joan Russo is a 60 year old female who presents today with a chief complaint of depression and double vision. She called into team health this morning with complaints of double vision that occurred for 1 minute on Sunday this week while at church.   1) Anxiety and Depression: Currently managed on Cymbalta 60 mg, Lyrica 75 mg, and Alprazolam 0.5 mg. Over the past 3 weeks has felt depressed. She uses the Alprazolam very sparingly. She recently started Lyrica 6 months ago for neuropathic pain with improvement. She's noticed tearfulness, feeling tired/fatigued and is napping more frequently than usual. She doesn't feel sad and doesn't believe she has anything to cry about. Denies SI/HI, increased stress, anxiety, any changes in her life recently. PHQ 9 score of 11.   2) Double Vision: Occurred while sitting in church yesterday morning. She was feeling "odd" that morning and noticed a sudden onset of double vision that lasted for 1 minute total. Denies dizziness, chest pain, numbness/tingling, weakness, headaches. She feels as though she has pressure behind her eyes. Her double vision has not re-occurred since. She completed an annual eye exam 2 weeks ago which was stable per patient.    She has noticed shaking to her bilateral fingers for the past 2-3 weeks. She is making mistakes when typing which is unusual for herself.  Review of Systems  Constitutional: Negative for fever.  Eyes: Positive for visual disturbance.  Respiratory: Negative for shortness of breath.   Cardiovascular: Negative for chest pain.  Genitourinary: Negative for dysuria.  Neurological: Negative for dizziness, numbness and headaches.  Psychiatric/Behavioral: Negative for sleep disturbance. The patient is not nervous/anxious.        See history of present illness       Past Medical History  Diagnosis Date  .  Obesity, Class III, BMI 40-49.9 (morbid obesity) ([place])   . Anxiety and depression   . Vitamin D deficiency   . Urinary incontinence   . OA (osteoarthritis)   . Anxiety   . Depression   . Osteopenia   . GERD (gastroesophageal reflux disease)   . Tubular adenoma   . Internal hemorrhoids   . Diverticulosis      Social History   Social History  . Marital Status: Widowed    Spouse Name: N/A  . Number of Children: 4  . Years of Education: N/A   Occupational History  . Armed forces technical officer    Social History Main Topics  . Smoking status: Never Smoker   . Smokeless tobacco: Never Used  . Alcohol Use: No  . Drug Use: No  . Sexual Activity: Not on file   Other Topics Concern  . Not on file   Social History Narrative    Past Surgical History  Procedure Laterality Date  . Total knee arthroplasty  08/2008    Left  . Hysteroscopy w/d&c N/A 01/16/2015    Procedure: DILATATION AND CURETTAGE /HYSTEROSCOPY;  Surgeon: Donnamae Jude, MD;  Location: [facility];  Service: Gynecology;  Laterality: N/A;    Family History  Problem Relation Age of Onset  . Cancer Mother 62    breast  . Cancer Daughter 77    breast  . Colon cancer Neg Hx   . Esophageal cancer Neg Hx   . Rectal cancer Neg Hx   . Stomach cancer Neg Hx   . Cancer  Father     No Known Allergies  Current Outpatient Prescriptions on File Prior to Visit  Medication Sig Dispense Refill  . ALPRAZolam (XANAX) 0.5 MG tablet Take 1 tablet (0.5 mg total) by mouth at bedtime as needed for anxiety. 30 tablet 0  . clobetasol cream (TEMOVATE) [id number] % Apply 1 application topically 2 (two) times daily. X 6 wks, then drop to once daily x 6 wks, then 2x/wk 30 g 5  . diclofenac (VOLTAREN) 75 MG EC tablet Take 1 tablet (75 mg total) by mouth 2 (two) times daily. 180 tablet 1  . DULoxetine (CYMBALTA) 60 MG capsule Take 1 capsule (60 mg total) by mouth daily. 90 capsule 1  . hydrochlorothiazide (MICROZIDE) 12.5 MG capsule TAKE ONE CAPSULE BY  MOUTH EVERY DAY 30 capsule 2  . LYRICA 75 MG capsule TAKE 1 CAPSULE BY MOUTH 2 TIMES A DAY 60 capsule 0  . Multiple Vitamin (MULTIVITAMIN WITH MINERALS) TABS tablet Take 1 tablet by mouth daily.    . pantoprazole (PROTONIX) 40 MG tablet TAKE 1 TABLET BY MOUTH EVERY DAY 30 tablet 4   No current facility-administered medications on file prior to visit.    BP 130/74 mmHg  Pulse 68  Temp(Src) 97.9 F (36.6 C) (Oral)  Ht 4\' 10"  (1.473 m)  Wt 279 lb 1.9 oz (126.608 kg)  BMI 58.35 kg/m2  SpO2 98%    Objective:   Physical Exam  Constitutional: She is oriented to person, place, and time. She appears well-nourished.  Eyes: EOM are normal. Pupils are equal, round, and reactive to light.  Neck: Neck supple.  Cardiovascular: Normal rate and regular rhythm.   Pulmonary/Chest: Effort normal and breath sounds normal.  Neurological: She is alert and oriented to person, place, and time. No cranial nerve deficit. Coordination normal.  Psychiatric: She has a normal mood and affect.  Did experience mild tearfulness during history of present illness          Assessment & Plan:  Double vision:  Sudden onset during church yesterday that lasted 1 minute in duration. No dizziness, headaches, numbness, weakness. Neuro exam unremarkable today. Given morbid obesity, history of hypertension, normal eye exam 2 weeks ago, will rule out acute TIA/CVA with CT scan of head. If CT unremarkable, will have her monitor symptoms and report back if symptoms return.

## 2016-06-08 NOTE — Telephone Encounter (Signed)
Patient Name: Joan Russo DOB: [birth year] Initial Comment Caller States she thinks she may be having side affects from a medication, double vision, shaky hands, feeling depressed Nurse Assessment Nurse: Ronnald Ramp, RN, Miranda Date/Time (Eastern Time): 06/08/2016 9:01:27 AM Confirm and document reason for call. If symptomatic, describe symptoms. You must click the next button to save text entered. ---Caller states yesterday at church she had double vision for about 1 min. Also, having depression. She has been taking [place] for awhile. Has the patient traveled out of the country within the last 30 days? ---Not Applicable Does the patient have any new or worsening symptoms? ---Yes Will a triage be completed? ---Yes Related visit to physician within the last 2 weeks? ---No Does the PT have any chronic conditions? (i.e. diabetes, asthma, etc.) ---Yes List chronic conditions. ---Depression, chronic pain, anxiety, Edema Is this a behavioral health or substance abuse call? ---Yes Are you having any thoughts or feelings of harming or killing yourself or someone else? ---Yes Do you have a weapon with you? ---No Are you alone? ---No Are you currently experiencing any physical discomfort that you think may be related to the use of alcohol or other drugs? (use substance abuse or alcohol abuse guidelines. These include withdrawal symptoms) ---No Do you worry that you may be hearing or seeing things that others do not? ---No Do you take medications for your condition(s)? ---Yes List medications here. ---Cymbalta Guidelines Guideline Title Affirmed Question Affirmed Notes Depression [1] Depression AND [2] worsening (e.g.,sleeping poorly, less able to do activities of daily living) Final Disposition User See Physician within 24 Hours Ronnald Ramp, Therapist, sports, Miranda Comments No appt available with PCP within recommended time frame, Appt scheduled for today at 10:45am with Alma Friendly,  NP. Referrals REFERRED TO PCP OFFICE Disagree/Comply: Comply

## 2016-06-08 NOTE — Progress Notes (Signed)
Pre visit review using our clinic review tool, if applicable. No additional management support is needed unless otherwise documented below in the visit note. 

## 2016-06-08 NOTE — Patient Instructions (Addendum)
Complete lab work prior to leaving today. I will notify you of your results once received.   Stop by the front desk and speak with either Rosaria Ferries or Ebony Hail regarding your CT scan.  If no improvement in tearfulness in 2 weeks, the please notify Dr. Deborra Medina as you may require an additional medication.   It was a pleasure meeting you!

## 2016-06-09 ENCOUNTER — Ambulatory Visit (HOSPITAL_COMMUNITY): Payer: BLUE CROSS/BLUE SHIELD

## 2016-06-11 ENCOUNTER — Other Ambulatory Visit: Payer: Self-pay | Admitting: Internal Medicine

## 2016-06-11 ENCOUNTER — Telehealth: Payer: Self-pay | Admitting: *Deleted

## 2016-06-11 ENCOUNTER — Ambulatory Visit (HOSPITAL_COMMUNITY)
Admission: RE | Admit: 2016-06-11 | Discharge: 2016-06-11 | Disposition: A | Discharge status: Home / Self Care | Payer: BLUE CROSS/BLUE SHIELD | Source: Ambulatory Visit | Attending: Internal Medicine | Admitting: Internal Medicine

## 2016-06-11 DIAGNOSIS — R490 Dysphonia: Secondary | ICD-10-CM | POA: Diagnosis not present

## 2016-06-11 DIAGNOSIS — K222 Esophageal obstruction: Secondary | ICD-10-CM

## 2016-06-11 DIAGNOSIS — R1310 Dysphagia, unspecified: Secondary | ICD-10-CM | POA: Diagnosis not present

## 2016-06-11 DIAGNOSIS — K224 Dyskinesia of esophagus: Secondary | ICD-10-CM | POA: Diagnosis not present

## 2016-06-11 DIAGNOSIS — Z86010 Personal history of colonic polyps: Secondary | ICD-10-CM

## 2016-06-11 DIAGNOSIS — K449 Diaphragmatic hernia without obstruction or gangrene: Secondary | ICD-10-CM

## 2016-06-11 MED ORDER — MAGNESIUM HYDROXIDE 400 MG/5ML PO SUSP
ORAL | Status: AC
  Filled 2016-06-11: qty 30

## 2016-06-11 NOTE — Telephone Encounter (Signed)
I have spoken to patient to advise her of Dr Vena Rua interpretation of results as well as recommendations. I have also spoken with Dr Hilarie Fredrickson regarding patient being due for recall colonoscopy 01/2017. He is agreeable to doing endoscopy and colonoscopy at the same time for patient's convenience as well as due to time constraints at hospital. Patient is agreeable to this plan as well (states she actually just received a note from insurance that she needs to have colonoscopy). She has been scheduled for previsit on 06/29/16 and for endo with dil/colonoscopy at [facility] on 07/07/16 @ 8:30 am.

## 2016-06-11 NOTE — Telephone Encounter (Signed)
-----   Message from Jerene Bears, MD sent at 06/11/2016 12:17 PM EDT ----- Patient has small hiatal hernia and narrowing in the lower esophagus likely related to a Schatzki's ring. Scar tissue from reflux. Would likely benefit from dilation Upper endoscopy recommended, hospital setting due to BMI Tuesday am, or Friday am in July, non-hospital week

## 2016-06-29 ENCOUNTER — Other Ambulatory Visit: Payer: Self-pay | Admitting: Family Medicine

## 2016-06-29 ENCOUNTER — Encounter (HOSPITAL_COMMUNITY): Payer: Self-pay | Admitting: *Deleted

## 2016-06-29 NOTE — Telephone Encounter (Signed)
Last f/u 10/2015

## 2016-07-01 ENCOUNTER — Ambulatory Visit (AMBULATORY_SURGERY_CENTER): Payer: Self-pay

## 2016-07-01 VITALS — Ht 69.5 in | Wt 281.2 lb

## 2016-07-01 DIAGNOSIS — Z860100 Personal history of colon polyps, unspecified: Secondary | ICD-10-CM

## 2016-07-01 MED ORDER — SUPREP BOWEL PREP KIT 17.5-3.13-1.6 GM/177ML PO SOLN: 1.0000 | Freq: Once | ORAL | Status: DC

## 2016-07-01 NOTE — Progress Notes (Signed)
WOKE UP AT BEGINNING OF PROCEDURE No allergies to eggs or soy No diet meds No home oxygen  BMI>50

## 2016-07-01 NOTE — Telephone Encounter (Signed)
Rx called in to requested pharmacy 

## 2016-07-07 ENCOUNTER — Ambulatory Visit (HOSPITAL_COMMUNITY): Payer: BLUE CROSS/BLUE SHIELD | Admitting: Anesthesiology

## 2016-07-07 ENCOUNTER — Encounter (HOSPITAL_COMMUNITY)
Admission: RE | Disposition: A | Discharge status: Home / Self Care | Payer: Self-pay | Source: Ambulatory Visit | Attending: Internal Medicine

## 2016-07-07 ENCOUNTER — Ambulatory Visit (HOSPITAL_COMMUNITY)
Admission: RE | Admit: 2016-07-07 | Discharge: 2016-07-07 | Disposition: A | Discharge status: Home / Self Care | Payer: BLUE CROSS/BLUE SHIELD | Source: Ambulatory Visit | Attending: Internal Medicine | Admitting: Internal Medicine

## 2016-07-07 ENCOUNTER — Encounter (HOSPITAL_COMMUNITY): Payer: Self-pay | Admitting: Anesthesiology

## 2016-07-07 DIAGNOSIS — G4730 Sleep apnea, unspecified: Secondary | ICD-10-CM | POA: Insufficient documentation

## 2016-07-07 DIAGNOSIS — Z6841 Body Mass Index (BMI) 40.0 and over, adult: Secondary | ICD-10-CM | POA: Diagnosis not present

## 2016-07-07 DIAGNOSIS — F419 Anxiety disorder, unspecified: Secondary | ICD-10-CM | POA: Diagnosis not present

## 2016-07-07 DIAGNOSIS — Z96652 Presence of left artificial knee joint: Secondary | ICD-10-CM | POA: Insufficient documentation

## 2016-07-07 DIAGNOSIS — K219 Gastro-esophageal reflux disease without esophagitis: Secondary | ICD-10-CM | POA: Insufficient documentation

## 2016-07-07 DIAGNOSIS — K649 Unspecified hemorrhoids: Secondary | ICD-10-CM | POA: Diagnosis not present

## 2016-07-07 DIAGNOSIS — Q394 Esophageal web: Secondary | ICD-10-CM | POA: Diagnosis not present

## 2016-07-07 DIAGNOSIS — K5730 Diverticulosis of large intestine without perforation or abscess without bleeding: Secondary | ICD-10-CM | POA: Diagnosis not present

## 2016-07-07 DIAGNOSIS — Z79899 Other long term (current) drug therapy: Secondary | ICD-10-CM | POA: Insufficient documentation

## 2016-07-07 DIAGNOSIS — F329 Major depressive disorder, single episode, unspecified: Secondary | ICD-10-CM | POA: Diagnosis not present

## 2016-07-07 DIAGNOSIS — Q396 Congenital diverticulum of esophagus: Secondary | ICD-10-CM | POA: Insufficient documentation

## 2016-07-07 DIAGNOSIS — Z9989 Dependence on other enabling machines and devices: Secondary | ICD-10-CM | POA: Diagnosis not present

## 2016-07-07 DIAGNOSIS — M1990 Unspecified osteoarthritis, unspecified site: Secondary | ICD-10-CM | POA: Insufficient documentation

## 2016-07-07 DIAGNOSIS — E669 Obesity, unspecified: Secondary | ICD-10-CM | POA: Diagnosis not present

## 2016-07-07 DIAGNOSIS — R1310 Dysphagia, unspecified: Secondary | ICD-10-CM

## 2016-07-07 DIAGNOSIS — K648 Other hemorrhoids: Secondary | ICD-10-CM | POA: Insufficient documentation

## 2016-07-07 DIAGNOSIS — K449 Diaphragmatic hernia without obstruction or gangrene: Secondary | ICD-10-CM | POA: Diagnosis not present

## 2016-07-07 DIAGNOSIS — Z86010 Personal history of colonic polyps: Secondary | ICD-10-CM | POA: Diagnosis not present

## 2016-07-07 DIAGNOSIS — K222 Esophageal obstruction: Secondary | ICD-10-CM

## 2016-07-07 DIAGNOSIS — Z1211 Encounter for screening for malignant neoplasm of colon: Secondary | ICD-10-CM | POA: Insufficient documentation

## 2016-07-07 HISTORY — PX: ESOPHAGOGASTRODUODENOSCOPY (EGD) WITH PROPOFOL: SHX5813

## 2016-07-07 HISTORY — DX: Adverse effect of unspecified anesthetic, initial encounter: T41.45XA

## 2016-07-07 HISTORY — PX: COLONOSCOPY WITH PROPOFOL: SHX5780

## 2016-07-07 SURGERY — ESOPHAGOGASTRODUODENOSCOPY (EGD) WITH PROPOFOL
Anesthesia: Monitor Anesthesia Care

## 2016-07-07 MED ORDER — FENTANYL CITRATE (PF) 100 MCG/2ML IJ SOLN: 25.0000 ug | INTRAMUSCULAR | Status: DC | PRN

## 2016-07-07 MED ORDER — PROPOFOL 10 MG/ML IV BOLUS
INTRAVENOUS | Status: DC | PRN
  Administered 2016-07-07 (×2): 50 mg via INTRAVENOUS

## 2016-07-07 MED ORDER — PROPOFOL 500 MG/50ML IV EMUL
INTRAVENOUS | Status: DC | PRN
  Administered 2016-07-07: 75 ug/kg/min via INTRAVENOUS

## 2016-07-07 MED ORDER — PROPOFOL 10 MG/ML IV BOLUS
INTRAVENOUS | Status: AC
  Filled 2016-07-07: qty 20

## 2016-07-07 MED ORDER — LACTATED RINGERS IV SOLN
INTRAVENOUS | Status: DC
  Administered 2016-07-07: 1000 mL via INTRAVENOUS

## 2016-07-07 MED ORDER — LIDOCAINE HCL (CARDIAC) 20 MG/ML IV SOLN
INTRAVENOUS | Status: AC
  Filled 2016-07-07: qty 5

## 2016-07-07 MED ORDER — PROPOFOL 10 MG/ML IV BOLUS
INTRAVENOUS | Status: AC
  Filled 2016-07-07: qty 40

## 2016-07-07 MED ORDER — SODIUM CHLORIDE 0.9 % IV SOLN: INTRAVENOUS | Status: DC

## 2016-07-07 MED ORDER — LIDOCAINE HCL (CARDIAC) 20 MG/ML IV SOLN
INTRAVENOUS | Status: DC | PRN
  Administered 2016-07-07: 50 mg via INTRAVENOUS

## 2016-07-07 SURGICAL SUPPLY — 25 items

## 2016-07-07 NOTE — H&P (Signed)
HPI: Joan Russo is a 60 year old female who presents for outpatient endoscopy and colonoscopy. She has a history of GERD with recent dysphagia. Barium esophagram shows hiatal hernia and probable distal esophageal stricture. She also has history of diverticulosis and adenomatous colon polyps. Last colonoscopy January 2013. She had 5 polyps removed 2 of which were precancerous. Stable intermittent solid food dysphagia. Some heartburn intermittently but improved by pantoprazole. No blood in her stool or melena  Past Medical History  Diagnosis Date  . Obesity, Class III, BMI 40-49.9 (morbid obesity) ([place])   . Anxiety and depression   . Vitamin D deficiency   . Urinary incontinence   . OA (osteoarthritis)   . Anxiety   . Depression   . Osteopenia   . GERD (gastroesophageal reflux disease)   . Tubular adenoma   . Internal hemorrhoids   . Diverticulosis   . Complication of anesthesia     woke up at beginning of procedure    Past Surgical History  Procedure Laterality Date  . Total knee arthroplasty  08/2008    Left  . Hysteroscopy w/d&c N/A 01/16/2015    Procedure: DILATATION AND CURETTAGE /HYSTEROSCOPY;  Surgeon: Donnamae Jude, MD;  Location: [facility];  Service: Gynecology;  Laterality: N/A;     (Not in an outpatient encounter)  No Known Allergies  Family History  Problem Relation Age of Onset  . Cancer Mother 37    breast  . Cancer Daughter 44    breast  . Colon cancer Neg Hx   . Esophageal cancer Neg Hx   . Rectal cancer Neg Hx   . Stomach cancer Neg Hx   . Cancer Father     Social History  Substance Use Topics  . Smoking status: Never Smoker   . Smokeless tobacco: Never Used  . Alcohol Use: No    ROS: As per history of present illness, otherwise negative  BP 152/70 mmHg  Pulse 65  Temp(Src) 98 F (36.7 C) (Oral)  Resp 20  Ht 4\' 10"  (1.473 m)  Wt 280 lb (127.007 kg)  BMI 58.54 kg/m2  SpO2 93% Gen: awake, alert, NAD HEENT: anicteric, op clear CV:  RRR, no mrg Pulm: CTA b/l Abd: soft, obese NT/ND, +BS throughout Ext: no c/c/e Neuro: nonfocal   RELEVANT LABS AND IMAGING: CBC    Component Value Date/Time   WBC 10.9 10/04/2015 1114   RBC 4.84 10/04/2015 1114   HGB 14.3 10/04/2015 1114   HCT 43.2 10/04/2015 1114   PLT 270 10/04/2015 1114   MCV 89.2 10/04/2015 1114   MCH 29.5 10/04/2015 1114   MCHC 33.1 10/04/2015 1114   RDW 14.0 10/04/2015 1114   LYMPHSABS 4.3* 10/13/2011 1143   MONOABS 0.5 10/13/2011 1143   EOSABS 0.3 10/13/2011 1143   BASOSABS 0.0 10/13/2011 1143    CMP     Component Value Date/Time   NA 142 06/08/2016 1128   K 4.3 06/08/2016 1128   CL 102 06/08/2016 1128   CO2 33* 06/08/2016 1128   GLUCOSE 98 06/08/2016 1128   BUN 20 06/08/2016 1128   CREATININE 0.84 06/08/2016 1128   CALCIUM 9.7 06/08/2016 1128   PROT 7.3 06/08/2016 1128   ALBUMIN 4.2 06/08/2016 1128   AST 28 06/08/2016 1128   ALT 38* 06/08/2016 1128   ALKPHOS 69 06/08/2016 1128   BILITOT 0.5 06/08/2016 1128   GFRNONAA >60 10/04/2015 1114   GFRAA >60 10/04/2015 1114    ASSESSMENT/PLAN: 60 year old female with GERD, hiatal hernia solid food  dysphagia and history of colon polyps  1. GERD and dysphagia -- EGD recommended for probable dilation given symptoms and barium swallow results. We discussed the risk, benefits and alternatives and she wishes to proceed.  2. Hx of colonic polyps -- surveillance colonoscopy today with MAC.  Again after discussion of risks, benefits and alternatives she wishes to proceed

## 2016-07-07 NOTE — Transfer of Care (Signed)
Immediate Anesthesia Transfer of Care Note  Patient: Joan Russo  Procedure(s) Performed: Procedure(s): ESOPHAGOGASTRODUODENOSCOPY (EGD) WITH PROPOFOL (N/A) SAVORY DILATION (N/A) COLONOSCOPY WITH PROPOFOL (N/A)  Patient Location: PACU  Anesthesia Type:MAC  Level of Consciousness: awake, alert  and oriented  Airway & Oxygen Therapy: Patient Spontanous Breathing and Patient connected to nasal cannula oxygen  Post-op Assessment: Report given to RN and Post -op Vital signs reviewed and stable  Post vital signs: Reviewed and stable  Last Vitals:  Filed Vitals:   07/07/16 0808  BP: 152/70  Pulse: 65  Temp: 36.7 C  Resp: 20    Last Pain: There were no vitals filed for this visit.       Complications: No apparent anesthesia complications

## 2016-07-07 NOTE — Anesthesia Preprocedure Evaluation (Addendum)
Anesthesia Evaluation  Patient identified by MRN, date of birth, ID band Patient awake    Reviewed: Allergy & Precautions, NPO status , Patient's Chart, lab work & pertinent test results  History of Anesthesia Complications (+) AWARENESS UNDER ANESTHESIA and history of anesthetic complications  Airway Mallampati: III  TM Distance: >3 FB Neck ROM: Full    Dental no notable dental hx. (+) Teeth Intact   Pulmonary neg pulmonary ROS, sleep apnea and Continuous Positive Airway Pressure Ventilation ,    Pulmonary exam normal breath sounds clear to auscultation       Cardiovascular negative cardio ROS Normal cardiovascular exam Rhythm:Regular Rate:Normal  Cardiac Cath 2007- normal EF and coronaries  EKG 09/2015- NSR with poor R wave progression anterior leads   Neuro/Psych PSYCHIATRIC DISORDERS Anxiety Depression  Neuromuscular disease negative neurological ROS     GI/Hepatic Neg liver ROS, hiatal hernia, GERD  Medicated and Controlled,Schatzki ring Hx/o adenomatous colonic polyps   Endo/Other  Hypothyroidism Morbid obesity  Renal/GU negative Renal ROS  negative genitourinary   Musculoskeletal  (+) Arthritis , Osteoarthritis,  Fibromyalgia -S/P TKR   Abdominal (+) + obese,   Peds  Hematology   Anesthesia Other Findings   Reproductive/Obstetrics PMB                            Anesthesia Physical  Anesthesia Plan  ASA: III  Anesthesia Plan: MAC   Post-op Pain Management:    Induction: Intravenous  Airway Management Planned: Natural Airway and Simple Face Mask  Additional Equipment:   Intra-op Plan:   Post-operative Plan:   Informed Consent: I have reviewed the patients History and Physical, chart, labs and discussed the procedure including the risks, benefits and alternatives for the proposed anesthesia with the patient or authorized representative who has indicated his/her  understanding and acceptance.   Dental advisory given  Plan Discussed with: Anesthesiologist, CRNA and Surgeon  Anesthesia Plan Comments:        Anesthesia Quick Evaluation

## 2016-07-07 NOTE — Discharge Instructions (Signed)

## 2016-07-07 NOTE — Op Note (Signed)
[facility] Patient Name: Joan Russo Procedure Date: 07/07/2016 MRN: [id number] Attending MD: Jerene Bears , MD Date of Birth: August 10, 1956 CSN: [id number] Age: 60 Admit Type: Outpatient Procedure:                Colonoscopy Indications:              High risk colon cancer surveillance: Personal                            history of non-advanced adenoma, Last colonoscopy:                            February 2013 Providers:                Lajuan Lines. Hilarie Fredrickson, MD, Cleda Daub, RN, William Dalton, Technician Referring MD:              Medicines:                Monitored Anesthesia Care Complications:            No immediate complications. Estimated Blood Loss:     Estimated blood loss: none. Procedure:                Pre-Anesthesia Assessment:                           - Prior to the procedure, a History and Physical                            was performed, and patient medications and                            allergies were reviewed. The patient's tolerance of                            previous anesthesia was also reviewed. The risks                            and benefits of the procedure and the sedation                            options and risks were discussed with the patient.                            All questions were answered, and informed consent                            was obtained. Prior Anticoagulants: The patient has                            taken no previous anticoagulant or antiplatelet                            agents. ASA Grade Assessment: III -  A patient with                            severe systemic disease. After reviewing the risks                            and benefits, the patient was deemed in                            satisfactory condition to undergo the procedure.                           - Prior to the procedure, a History and Physical                            was performed, and patient  medications and                            allergies were reviewed. The patient's tolerance of                            previous anesthesia was also reviewed. The risks                            and benefits of the procedure and the sedation                            options and risks were discussed with the patient.                            All questions were answered, and informed consent                            was obtained. Prior Anticoagulants: The patient has                            taken no previous anticoagulant or antiplatelet                            agents. ASA Grade Assessment: III - A patient with                            severe systemic disease. After reviewing the risks                            and benefits, the patient was deemed in                            satisfactory condition to undergo the procedure.                           After obtaining informed consent, the colonoscope  was passed under direct vision. Throughout the                            procedure, the patient's blood pressure, pulse, and                            oxygen saturations were monitored continuously. The                            [id number] scope was introduced through                            the anus and advanced to the the cecum, identified                            by appendiceal orifice and ileocecal valve. The                            colonoscopy was performed without difficulty. The                            patient tolerated the procedure well. The quality                            of the bowel preparation was good. The ileocecal                            valve, appendiceal orifice, and rectum were                            photographed. Scope In: 9:13:15 AM Scope Out: 9:28:48 AM Scope Withdrawal Time: 0 hours 0 minutes 11 seconds  Total Procedure Duration: 0 hours 15 minutes 33 seconds  Findings:      The digital rectal  exam was normal.      Scattered small and large-mouthed diverticula were found in the sigmoid       colon, descending colon and ascending colon.      Internal hemorrhoids were found during retroflexion. The hemorrhoids       were medium-sized.      The exam was otherwise without abnormality. Impression:               - Mild diverticulosis in the sigmoid colon, in the                            descending colon and in the ascending colon.                           - Internal hemorrhoids.                           - The examination was otherwise normal.                           - No specimens collected. Moderate Sedation:      N/A Recommendation:           -  Patient has a contact number available for                            emergencies. The signs and symptoms of potential                            delayed complications were discussed with the                            patient. Return to normal activities tomorrow.                            Written discharge instructions were provided to the                            patient.                           - Resume previous diet.                           - Continue present medications.                           - Repeat colonoscopy in 5 years for surveillance                            based on personal history of previous adenomatous                            and sessile serrated polyps. Procedure Code(s):        --- Professional ---                           [id number], Colorectal cancer screening; colonoscopy on                            individual at high risk Diagnosis Code(s):        --- Professional ---                           Z86.010, Personal history of colonic polyps                           K64.8, Other hemorrhoids                           K57.30, Diverticulosis of large intestine without                            perforation or abscess without bleeding CPT copyright 2016 American Medical Association. All rights  reserved. The codes documented in this report are preliminary and upon coder review may  be revised to meet current compliance requirements. Jerene Bears, MD 07/07/2016 9:45:28 AM This report has been signed electronically. Number of Addenda: 0

## 2016-07-07 NOTE — Anesthesia Postprocedure Evaluation (Signed)
Anesthesia Post Note  Patient: Joan Russo  Procedure(s) Performed: Procedure(s) (LRB): ESOPHAGOGASTRODUODENOSCOPY (EGD) WITH PROPOFOL (N/A) SAVORY DILATION (N/A) COLONOSCOPY WITH PROPOFOL (N/A)  Patient location during evaluation: PACU Anesthesia Type: MAC Level of consciousness: awake and alert and oriented Pain management: pain level controlled Vital Signs Assessment: post-procedure vital signs reviewed and stable Respiratory status: spontaneous breathing, nonlabored ventilation and respiratory function stable Cardiovascular status: blood pressure returned to baseline and stable Postop Assessment: no signs of nausea or vomiting Anesthetic complications: no    Last Vitals:  Filed Vitals:   07/07/16 0935 07/07/16 0938  BP: 136/70   Pulse: 72 77  Temp:    Resp: 21 19    Last Pain: There were no vitals filed for this visit.               Matther Labell A.

## 2016-07-07 NOTE — Op Note (Signed)
[facility] Patient Name: Joan Russo Procedure Date: 07/07/2016 MRN: [id number] Attending MD: Jerene Bears , MD Date of Birth: 1956/02/07 CSN: [id number] Age: 60 Admit Type: Outpatient Procedure:                Upper GI endoscopy Indications:              Dysphagia, Gastro-esophageal reflux disease,                            Abnormal cine-esophagram Providers:                Lajuan Lines. Hilarie Fredrickson, MD, Cleda Daub, RN, William Dalton, Technician Referring MD:              Medicines:                Monitored Anesthesia Care Complications:            No immediate complications. Estimated Blood Loss:     Estimated blood loss: none. Procedure:                Pre-Anesthesia Assessment:                           - Prior to the procedure, a History and Physical                            was performed, and patient medications and                            allergies were reviewed. The patient's tolerance of                            previous anesthesia was also reviewed. The risks                            and benefits of the procedure and the sedation                            options and risks were discussed with the patient.                            All questions were answered, and informed consent                            was obtained. Prior Anticoagulants: The patient has                            taken no previous anticoagulant or antiplatelet                            agents. ASA Grade Assessment: III - A patient with                            severe systemic  disease. After reviewing the risks                            and benefits, the patient was deemed in                            satisfactory condition to undergo the procedure.                           After obtaining informed consent, the endoscope was                            passed under direct vision. Throughout the                            procedure, the patient's  blood pressure, pulse, and                            oxygen saturations were monitored continuously. The                            [id number]) scope was introduced through the                            mouth, and advanced to the second part of duodenum.                            The upper GI endoscopy was accomplished without                            difficulty. The patient tolerated the procedure                            well. Scope In: Scope Out: Findings:      The lower third of the esophagus was moderately tortuous suggestive of       dysmotility.      A diverticulum with a small opening was found in the lower third of the       esophagus.      Stuble partial webs (2) were found in the lower third of the esophagus       (2-4 cm above the GEJ) which were not causing significant narrowing. A       TTS dilator was passed through the scope. Dilation with a 15-16.5-18 mm       balloon dilator was performed to 16.5 mm (dilation x 1 min).      The entire examined stomach was normal.      The cardia and gastric fundus were normal on retroflexion.      The examined duodenum was normal. 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                            Dilated.                           - Normal stomach.                           - Normal examined duodenum.                           - No specimens collected. Moderate Sedation:      N/A Recommendation:           - Patient has a contact number available for                            emergencies. The signs and symptoms of potential                            delayed complications were discussed with the                            patient. Return to normal activities tomorrow.                            Written discharge instructions were provided to the                             patient.                           - Resume previous diet.                           - Continue present medications.                           - Perform ambulatory esophageal manometry at                            appointment to be scheduled to evaluate motility                            and rule out spasm (as primary cause of                            intermittent dysphagia). Procedure Code(s):        --- Professional ---                           [id number], Esophagogastroduodenoscopy, flexible,                            transoral; with transendoscopic balloon dilation of  esophagus (less than 30 mm diameter) Diagnosis Code(s):        --- Professional ---                           Q39.9, Congenital malformation of esophagus,                            unspecified                           Q39.6, Congenital diverticulum of esophagus                           Q39.4, Esophageal web                           R13.10, Dysphagia, unspecified                           K21.9, Gastro-esophageal reflux disease without                            esophagitis                           R93.3, Abnormal findings on diagnostic imaging of                            other parts of digestive tract CPT copyright 2016 American Medical Association. All rights reserved. The codes documented in this report are preliminary and upon coder review may  be revised to meet current compliance requirements. Jerene Bears, MD 07/07/2016 9:41:57 AM This report has been signed electronically. Number of Addenda: 0

## 2016-07-08 ENCOUNTER — Encounter (HOSPITAL_COMMUNITY): Payer: Self-pay | Admitting: Internal Medicine

## 2016-07-13 ENCOUNTER — Telehealth: Payer: Self-pay

## 2016-07-13 NOTE — Telephone Encounter (Signed)
Pt scheduled for esophageal manometry at [facility] 07/22/16@[facility]  at 10:30am. Pt states she will be out of town that day and needs to move the appt. Pt given the phone number [phone] to call and reschedule the appt to a date that works for her.

## 2016-07-15 ENCOUNTER — Ambulatory Visit: Payer: BLUE CROSS/BLUE SHIELD | Admitting: Internal Medicine

## 2016-07-22 ENCOUNTER — Encounter (HOSPITAL_COMMUNITY): Admission: RE | Payer: Self-pay | Source: Ambulatory Visit

## 2016-07-22 ENCOUNTER — Ambulatory Visit (HOSPITAL_COMMUNITY)
Admission: RE | Admit: 2016-07-22 | Payer: BLUE CROSS/BLUE SHIELD | Source: Ambulatory Visit | Admitting: Internal Medicine

## 2016-07-22 SURGERY — MANOMETRY, ESOPHAGUS

## 2016-07-27 ENCOUNTER — Other Ambulatory Visit: Payer: Self-pay | Admitting: Family Medicine

## 2016-07-31 DIAGNOSIS — G4733 Obstructive sleep apnea (adult) (pediatric): Secondary | ICD-10-CM | POA: Diagnosis not present

## 2016-08-10 ENCOUNTER — Other Ambulatory Visit: Payer: Self-pay | Admitting: Family Medicine

## 2016-08-11 NOTE — Telephone Encounter (Signed)
Rx called in to requested pharmacy 

## 2016-08-11 NOTE — Telephone Encounter (Signed)
Last f/u 05/2016 

## 2016-08-15 DIAGNOSIS — M25532 Pain in left wrist: Secondary | ICD-10-CM | POA: Diagnosis not present

## 2016-08-15 DIAGNOSIS — M1711 Unilateral primary osteoarthritis, right knee: Secondary | ICD-10-CM | POA: Diagnosis not present

## 2016-08-27 ENCOUNTER — Other Ambulatory Visit: Payer: Self-pay | Admitting: Family Medicine

## 2016-09-13 ENCOUNTER — Other Ambulatory Visit: Payer: Self-pay | Admitting: Family Medicine

## 2016-09-14 NOTE — Telephone Encounter (Signed)
Rx called in to requested pharmacy 

## 2016-09-14 NOTE — Telephone Encounter (Signed)
Last f/u 05/2016 

## 2016-09-29 DIAGNOSIS — G4733 Obstructive sleep apnea (adult) (pediatric): Secondary | ICD-10-CM | POA: Diagnosis not present

## 2016-10-22 DIAGNOSIS — Z23 Encounter for immunization: Secondary | ICD-10-CM | POA: Diagnosis not present

## 2016-10-27 ENCOUNTER — Encounter: Payer: Self-pay | Admitting: *Deleted

## 2016-10-29 DIAGNOSIS — G4733 Obstructive sleep apnea (adult) (pediatric): Secondary | ICD-10-CM | POA: Diagnosis not present

## 2016-10-31 ENCOUNTER — Other Ambulatory Visit: Payer: Self-pay | Admitting: Family Medicine

## 2016-11-03 NOTE — Telephone Encounter (Signed)
Last f/u 05/2016 

## 2016-11-03 NOTE — Telephone Encounter (Signed)
Rx called in to requested pharmacy 

## 2016-11-25 DIAGNOSIS — G4733 Obstructive sleep apnea (adult) (pediatric): Secondary | ICD-10-CM | POA: Diagnosis not present

## 2016-12-01 ENCOUNTER — Other Ambulatory Visit: Payer: Self-pay | Admitting: Family Medicine

## 2016-12-01 DIAGNOSIS — Z1231 Encounter for screening mammogram for malignant neoplasm of breast: Secondary | ICD-10-CM

## 2016-12-07 ENCOUNTER — Other Ambulatory Visit: Payer: Self-pay | Admitting: Family Medicine

## 2016-12-08 NOTE — Telephone Encounter (Signed)
Last f/u 05/2016. See note from pharmacy

## 2016-12-10 ENCOUNTER — Other Ambulatory Visit: Payer: Self-pay | Admitting: Family Medicine

## 2016-12-15 ENCOUNTER — Ambulatory Visit (INDEPENDENT_AMBULATORY_CARE_PROVIDER_SITE_OTHER): Payer: BLUE CROSS/BLUE SHIELD | Admitting: Family Medicine

## 2016-12-15 ENCOUNTER — Encounter: Payer: Self-pay | Admitting: Family Medicine

## 2016-12-15 VITALS — BP 136/82 | HR 67 | Temp 98.0°F | Wt 280.5 lb

## 2016-12-15 DIAGNOSIS — F419 Anxiety disorder, unspecified: Secondary | ICD-10-CM

## 2016-12-15 DIAGNOSIS — I159 Secondary hypertension, unspecified: Secondary | ICD-10-CM

## 2016-12-15 DIAGNOSIS — F329 Major depressive disorder, single episode, unspecified: Secondary | ICD-10-CM

## 2016-12-15 DIAGNOSIS — F32A Depression, unspecified: Secondary | ICD-10-CM

## 2016-12-15 DIAGNOSIS — F418 Other specified anxiety disorders: Secondary | ICD-10-CM

## 2016-12-15 DIAGNOSIS — M792 Neuralgia and neuritis, unspecified: Secondary | ICD-10-CM | POA: Diagnosis not present

## 2016-12-15 MED ORDER — PREGABALIN 75 MG PO CAPS: 75.0000 mg | ORAL_CAPSULE | Freq: Two times a day (BID) | ORAL | 3 refills | Status: DC

## 2016-12-15 NOTE — Assessment & Plan Note (Signed)
Well controlled. No changes made today. 

## 2016-12-15 NOTE — Progress Notes (Signed)
Subjective:   Patient ID: Joan Russo, female    DOB: May 10, 1956, 60 y.o.   MRN: [id number]  Joan Russo is a pleasant 60 y.o. year old female who presents to clinic today with Follow-up  on 12/15/2016  HPI:  Anxiety, depression, chronic pain- currently taking Cymbalta 60 mg, Lyrical 75 mg and Alprazolam 0.5 mg.  She tries not to take Alprazolam often.  Feels Lyrica is really helping with her neuropathic pain.  HTN/Edema- well controlled with HCTZ 12.5 mg daily. Lab Results  Component Value Date   CREATININE 0.84 06/08/2016      Current Outpatient Prescriptions on File Prior to Visit  Medication Sig Dispense Refill  . ALPRAZolam (XANAX) 0.5 MG tablet Take 1 tablet (0.5 mg total) by mouth at bedtime as needed for anxiety. 30 tablet 0  . clobetasol cream (TEMOVATE) [id number] % Apply 1 application topically 2 (two) times daily. X 6 wks, then drop to once daily x 6 wks, then 2x/wk 30 g 5  . diclofenac (VOLTAREN) 75 MG EC tablet TAKE 1 TABLET (75 MG TOTAL) BY MOUTH 2 (TWO) TIMES DAILY. 180 tablet 1  . DULoxetine (CYMBALTA) 60 MG capsule TAKE 1 CAPSULE (60 MG TOTAL) BY MOUTH DAILY. 90 capsule 1  . hydrochlorothiazide (MICROZIDE) 12.5 MG capsule TAKE ONE CAPSULE BY MOUTH EVERY DAY 30 capsule 2  . LYRICA 75 MG capsule TAKE 1 CAPSULE BY MOUTH TWICE A DAY 180 capsule 0  . Multiple Vitamin (MULTIVITAMIN WITH MINERALS) TABS tablet Take 1 tablet by mouth daily.    . pantoprazole (PROTONIX) 40 MG tablet TAKE 1 TABLET BY MOUTH EVERY DAY 30 tablet 4   No current facility-administered medications on file prior to visit.     No Known Allergies  Past Medical History:  Diagnosis Date  . Anxiety   . Anxiety and depression   . Complication of anesthesia    woke up at beginning of procedure  . Depression   . Diverticulosis   . GERD (gastroesophageal reflux disease)   . Internal hemorrhoids   . OA (osteoarthritis)   . Obesity, Class III, BMI 40-49.9 (morbid obesity) ([place])   .  Osteopenia   . Tubular adenoma   . Urinary incontinence   . Vitamin D deficiency     Past Surgical History:  Procedure Laterality Date  . COLONOSCOPY WITH PROPOFOL N/A 07/07/2016   Procedure: COLONOSCOPY WITH PROPOFOL;  Surgeon: Jerene Bears, MD;  Location: [facility];  Service: Gastroenterology;  Laterality: N/A;  . ESOPHAGOGASTRODUODENOSCOPY (EGD) WITH PROPOFOL N/A 07/07/2016   Procedure: ESOPHAGOGASTRODUODENOSCOPY (EGD) WITH PROPOFOL;  Surgeon: Jerene Bears, MD;  Location: [facility];  Service: Gastroenterology;  Laterality: N/A;  . HYSTEROSCOPY W/D&C N/A 01/16/2015   Procedure: DILATATION AND CURETTAGE /HYSTEROSCOPY;  Surgeon: Donnamae Jude, MD;  Location: [facility];  Service: Gynecology;  Laterality: N/A;  . TOTAL KNEE ARTHROPLASTY  08/2008   Left    Family History  Problem Relation Age of Onset  . Cancer Mother 54    breast  . Cancer Daughter 24    breast  . Cancer Father   . Colon cancer Neg Hx   . Esophageal cancer Neg Hx   . Rectal cancer Neg Hx   . Stomach cancer Neg Hx     Social History   Social History  . Marital status: Widowed    Spouse name: N/A  . Number of children: 4  . Years of education: N/A   Occupational History  . Armed forces technical officer  Social History Main Topics  . Smoking status: Never Smoker  . Smokeless tobacco: Never Used  . Alcohol use No  . Drug use: No  . Sexual activity: Not on file   Other Topics Concern  . Not on file   Social History Narrative  . No narrative on file   The PMH, PSH, Social History, Family History, Medications, and allergies have been reviewed in [facility], and have been updated if relevant.   Review of Systems  Constitutional: Negative.   HENT: Negative.   Eyes: Negative.   Respiratory: Negative.   Cardiovascular: Negative.   Gastrointestinal: Negative.   Musculoskeletal: Negative.   Neurological: Negative.   Psychiatric/Behavioral: Negative.   All other systems reviewed and are negative.        Objective:    BP 136/82   Pulse 67   Temp 98 F (36.7 C) (Oral)   Wt 280 lb 8 oz (127.2 kg)   SpO2 97%   BMI 58.62 kg/m    Physical Exam  Constitutional: She is oriented to person, place, and time. She appears well-developed and well-nourished. No distress.  HENT:  Head: Normocephalic and atraumatic.  Eyes: Conjunctivae are normal.  Cardiovascular: Normal rate.   Pulmonary/Chest: Effort normal.  Musculoskeletal: Normal range of motion.  Neurological: She is alert and oriented to person, place, and time. No cranial nerve deficit.  Skin: She is not diaphoretic.  Psychiatric: She has a normal mood and affect. Her behavior is normal. Judgment and thought content normal.  Nursing note and vitals reviewed.         Assessment & Plan:   Secondary hypertension  Anxiety and depression  Neuropathic pain No Follow-up on file.

## 2016-12-15 NOTE — Progress Notes (Signed)
Pre visit review using our clinic review tool, if applicable. No additional management support is needed unless otherwise documented below in the visit note. 

## 2016-12-15 NOTE — Assessment & Plan Note (Signed)
Well controlled with current dose of lyrica. Rx printed and given to pt.

## 2016-12-15 NOTE — Assessment & Plan Note (Signed)
Well controlled with current rxs. No changes made today. 

## 2016-12-23 ENCOUNTER — Ambulatory Visit
Admission: RE | Admit: 2016-12-23 | Discharge: 2016-12-23 | Disposition: A | Discharge status: Home / Self Care | Payer: BLUE CROSS/BLUE SHIELD | Source: Ambulatory Visit | Attending: Family Medicine | Admitting: Family Medicine

## 2016-12-23 DIAGNOSIS — Z1231 Encounter for screening mammogram for malignant neoplasm of breast: Secondary | ICD-10-CM | POA: Diagnosis not present

## 2017-01-20 ENCOUNTER — Other Ambulatory Visit: Payer: Self-pay | Admitting: Family Medicine

## 2017-01-25 ENCOUNTER — Other Ambulatory Visit: Payer: Self-pay | Admitting: Family Medicine

## 2017-03-24 ENCOUNTER — Telehealth: Payer: Self-pay

## 2017-03-24 MED ORDER — PREGABALIN 75 MG PO CAPS: 75.0000 mg | ORAL_CAPSULE | Freq: Two times a day (BID) | ORAL | 1 refills | Status: DC

## 2017-03-24 NOTE — Telephone Encounter (Signed)
The paperwork has been filled out and a rx for Lyrica has been attached for Dr Deborra Medina to sign. Everything will need to be faxed to [phone]. Dr Deborra Medina, you can give this back to me when the rx is signed.

## 2017-03-25 NOTE — Telephone Encounter (Signed)
Forms faxed back to [employer]. Spoke to pt. She will come get the original forms

## 2017-04-29 ENCOUNTER — Other Ambulatory Visit: Payer: Self-pay

## 2017-04-29 MED ORDER — DULOXETINE HCL 60 MG PO CPEP: 60.0000 mg | ORAL_CAPSULE | Freq: Every day | ORAL | 0 refills | Status: DC

## 2017-07-29 ENCOUNTER — Other Ambulatory Visit: Payer: Self-pay | Admitting: Family Medicine

## 2017-07-29 NOTE — Telephone Encounter (Signed)
Last refill 04/29/17  Last OV 12/15/16 Ok to refill?

## 2017-10-03 ENCOUNTER — Other Ambulatory Visit: Payer: Self-pay | Admitting: Family Medicine

## 2017-10-04 NOTE — Telephone Encounter (Signed)
Last refill 07/29/17 #90+2  Last OV 12/15/16 Ok to refill?

## 2017-10-11 ENCOUNTER — Encounter: Payer: Self-pay | Admitting: Family Medicine

## 2017-10-11 MED ORDER — PREGABALIN 75 MG PO CAPS: 75.0000 mg | ORAL_CAPSULE | Freq: Two times a day (BID) | ORAL | 1 refills | Status: DC

## 2017-10-12 NOTE — Telephone Encounter (Signed)
Pt called to request a refill of her lyrica through [employer].  She said it takes a long time for them to refill so she needs this done asap.  Can you please call her to discuss.

## 2018-01-17 IMAGING — RF DG ESOPHAGUS
17 of 21 series · 17 of 24 positions shown · non-contrast
Comparison: None.

CLINICAL DATA: Dysphagia with solids in upper mid chest for year,
hoarseness.

EXAM:
ESOPHOGRAM / BARIUM SWALLOW / BARIUM TABLET STUDY
TECHNIQUE: Combined double contrast and single contrast examination performed
using effervescent crystals, thick barium liquid, and thin barium
liquid. The patient was observed with fluoroscopy swallowing a 13 mm
barium sulphate tablet.
FLUOROSCOPY TIME:  Fluoroscopy Time:  3 minutes 42 seconds

[Series 1: cp_standard · 0.34mm/px · 1 of 35 frames shown (1 of 17)]
[frame 5/35]
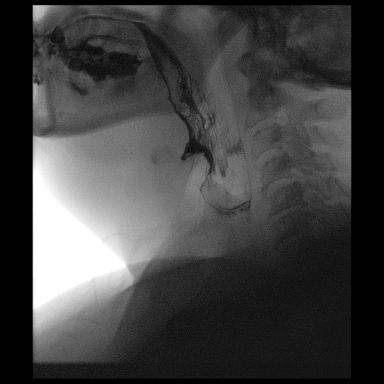

[Series 2: cp_standard · 0.34mm/px · 1 of 37 frames shown (2 of 17)]
[frame 33/37]
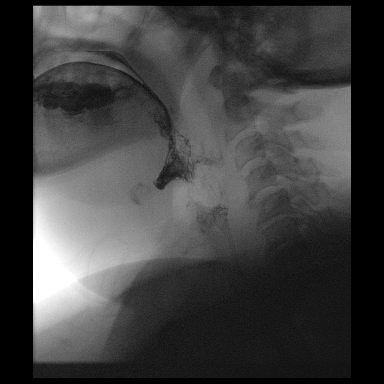

[Series 4: cp_standard · 0.34mm/px · 1 of 21 frames shown (3 of 17)]
[frame 18/21]
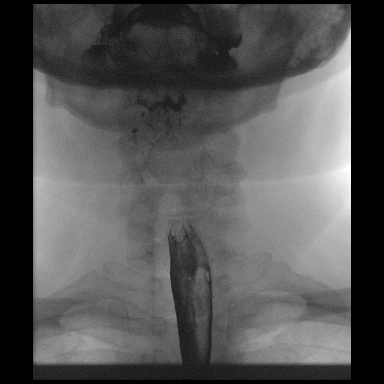

[Series 6: cp_standard · 0.34mm/px · 1 of 59 frames shown (4 of 17)]
[frame 30/59]
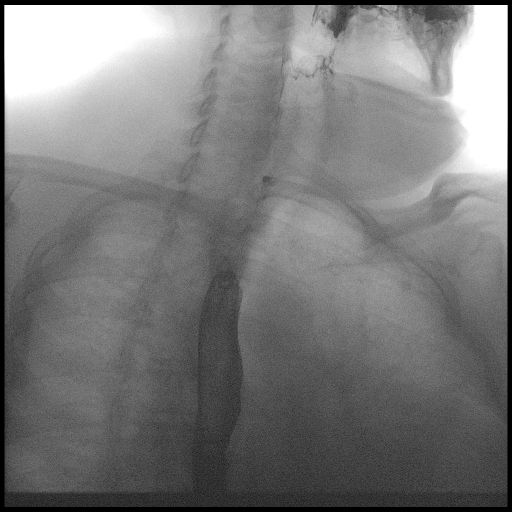

[Series 8: cp_standard · 0.34mm/px · 1 of 23 frames shown (5 of 17)]
[frame 4/23]
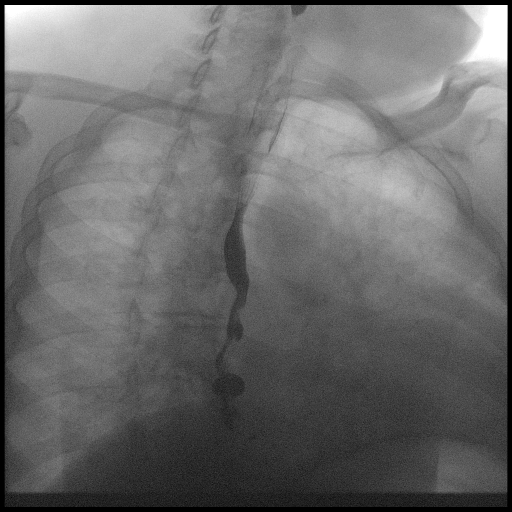

[Series 9: cp_standard · 0.34mm/px · 1 of 48 frames shown (6 of 17)]
[frame 8/48]
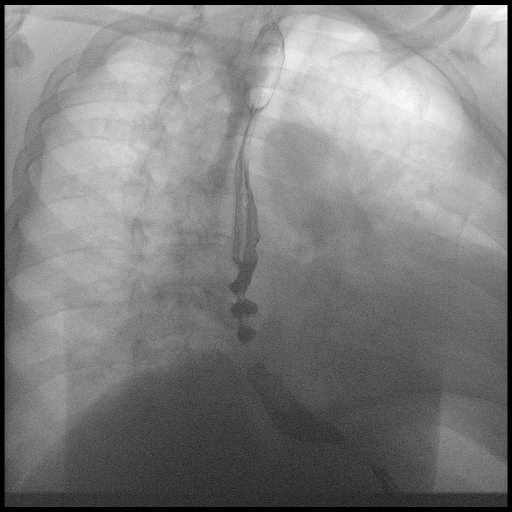

[Series 11: cp_standard · 0.34mm/px · 1 of 11 frames shown (7 of 17)]
[frame 2/11]
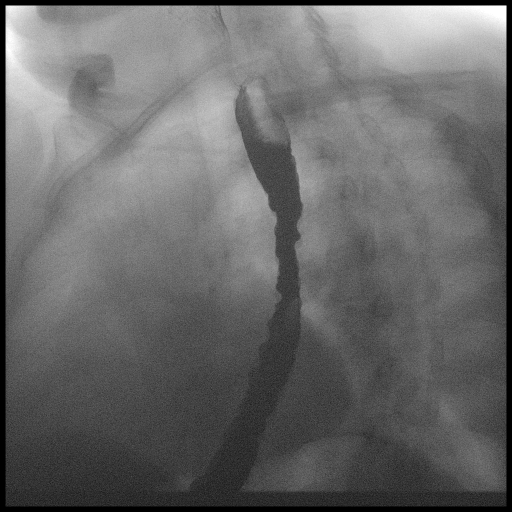

[Series 12: cp_standard · 0.34mm/px · 1 of 23 frames shown (8 of 17)]
[frame 4/23]
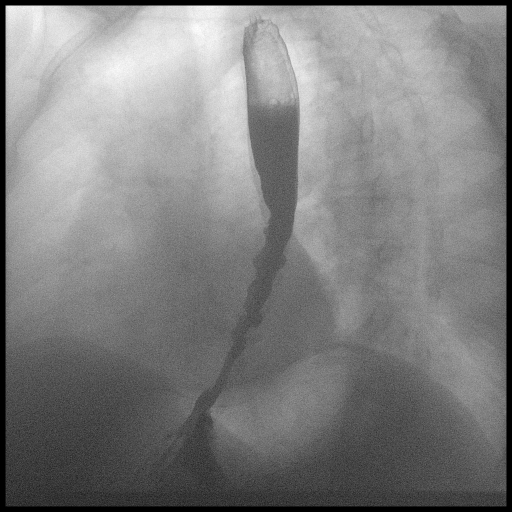

[Series 14: cp_standard · 0.34mm/px · 1 of 15 frames shown (9 of 17)]
[frame 3/15]
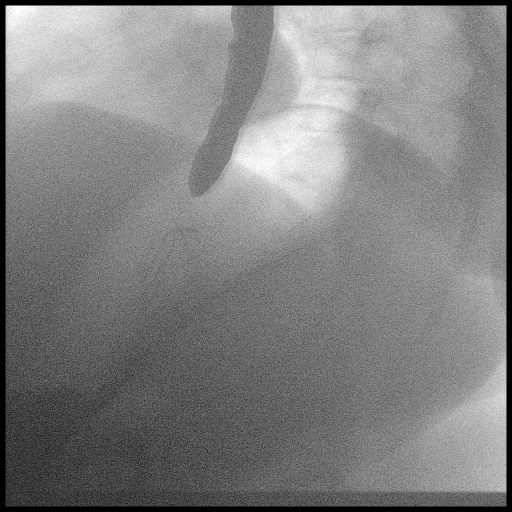

[Series 15: cp_standard · 0.36mm/px · 1 of 20 frames shown (10 of 17)]
[frame 1/20]
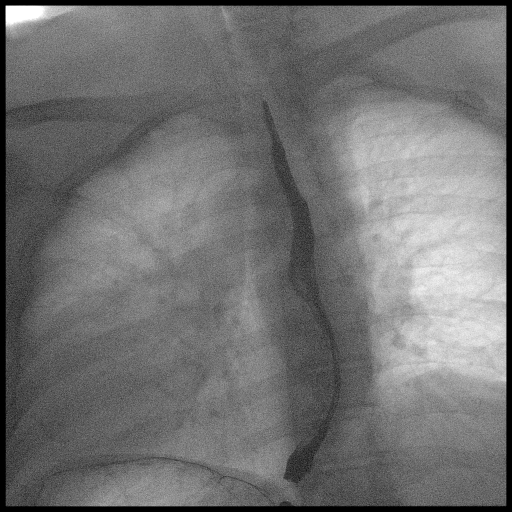

[Series 16: cp_standard · 0.36mm/px · 1 of 28 frames shown (11 of 17)]
[frame 5/28]
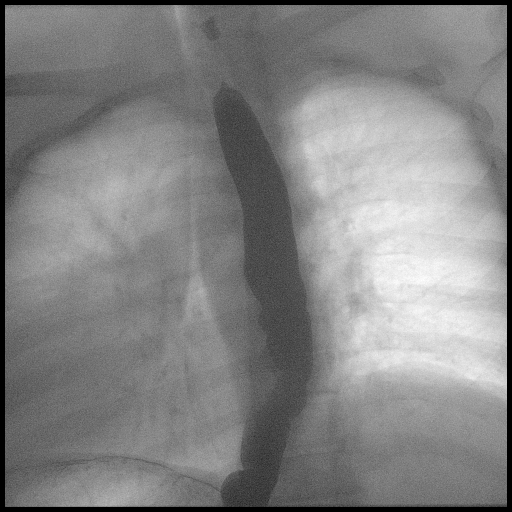

[Series 17: cp_standard · 0.36mm/px · 1 of 13 frames shown (12 of 17)]
[frame 12/13]
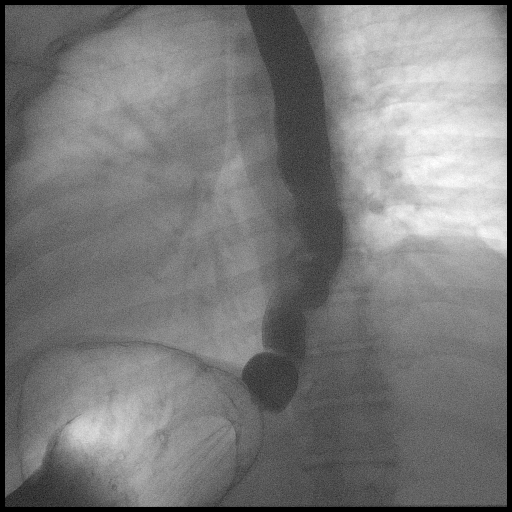

[Series 18: cp_standard · 0.36mm/px · 1 of 75 frames shown (13 of 17)]
[frame 64/75]
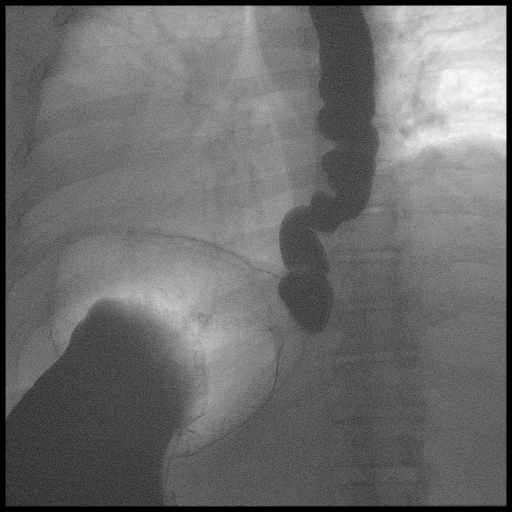

[Series 20: cp_standard · 0.37mm/px · 1 of 42 frames shown (14 of 17)]
[frame 29/42]
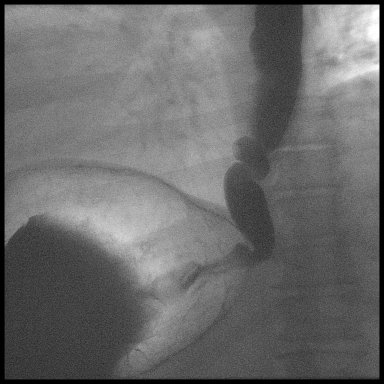

[Series 21: cp_standard · 0.37mm/px · 1 of 70 frames shown (15 of 17)]
[frame 11/70]
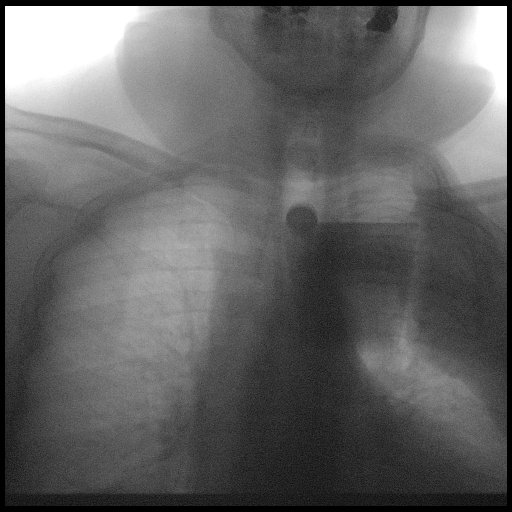

[Series 22: cp_standard · 0.37mm/px · 1 of 13 frames shown (16 of 17)]
[frame 7/13]
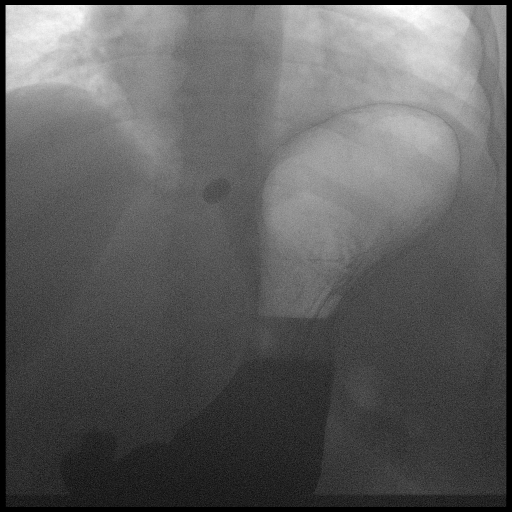

[Series 24: cp_standard · 0.18mm/px · 1 of 1 slices shown (17 of 17)]
[im 1/1]
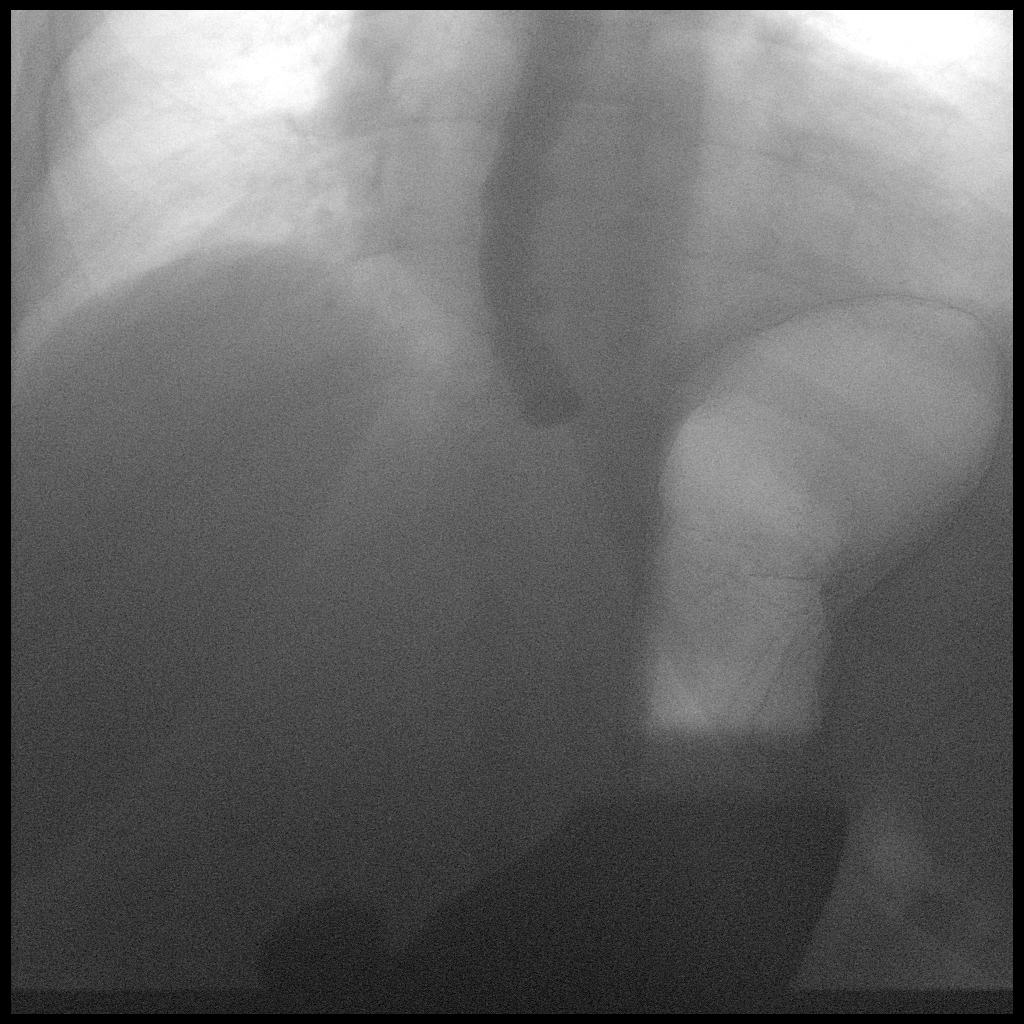

[17 of 24 positions shown; findings below may reference images not displayed]

FINDINGS: Initial evaluation of the hypopharynx and cervical esophagus
appeared normal without mass, ulceration or other focal wall
irregularity. Thick and thin consistency barium contrast material
moved promptly through the hypopharynx and cervical esophagus
without evidence of obstruction or dysmotility.

Proximal thoracic esophagus appeared normal in caliber and
configuration without mass, ulceration or other focal wall
irregularity. Thin consistency barium contrast material moved
promptly through the upper thoracic esophagus without evidence of
obstruction or dysmotility.

With Valsalva maneuvers, a small sliding hiatal hernia was
identified. Just above the hiatal hernia, a persistent smooth
circumferential narrowing of the lower esophagus was identified
suggesting Schatzki's ring or other stricture. A 13 mm barium tablet
would not pass this narrowing. During the exam, there was
development of tertiary contractions and "to and fro" flow of
contrast material within the mid and lower thoracic esophagus
indicating associated secondary dysmotility.
IMPRESSION: 1. Small hiatal hernia. Just above the hiatal hernia, a persistent
smooth circumferential narrowing of the lower esophagus suggesting
Schatzki's ring or other benign stricture. Neoplastic narrowing is
felt to be less likely. 13 mm barium tablet would not pass through
this narrowing. Recommend endoscopic evaluation to ensure benignity
and for consideration of therapeutic dilatation.
2. Evidence of secondary dysmotility within the mid and lower
thoracic esophagus, with associated tertiary contractions and "to
and fro" flow of contrast material throughout the exam.
3. Hypopharynx, cervical esophagus and upper thoracic esophagus
appeared normal.

## 2018-01-24 ENCOUNTER — Encounter: Payer: Self-pay | Admitting: Family Medicine

## 2018-01-24 ENCOUNTER — Ambulatory Visit: Payer: BLUE CROSS/BLUE SHIELD | Admitting: Family Medicine

## 2018-01-24 VITALS — BP 128/76 | HR 62 | Temp 97.8°F | Wt 287.8 lb

## 2018-01-24 DIAGNOSIS — F329 Major depressive disorder, single episode, unspecified: Secondary | ICD-10-CM | POA: Diagnosis not present

## 2018-01-24 DIAGNOSIS — F419 Anxiety disorder, unspecified: Secondary | ICD-10-CM | POA: Diagnosis not present

## 2018-01-24 DIAGNOSIS — M792 Neuralgia and neuritis, unspecified: Secondary | ICD-10-CM

## 2018-01-24 DIAGNOSIS — Z6841 Body Mass Index (BMI) 40.0 and over, adult: Secondary | ICD-10-CM

## 2018-01-24 DIAGNOSIS — Z7689 Persons encountering health services in other specified circumstances: Secondary | ICD-10-CM

## 2018-01-24 DIAGNOSIS — N904 Leukoplakia of vulva: Secondary | ICD-10-CM | POA: Diagnosis not present

## 2018-01-24 DIAGNOSIS — F32A Depression, unspecified: Secondary | ICD-10-CM

## 2018-01-24 MED ORDER — CLOBETASOL PROPIONATE 0.05 % EX CREA: 1.0000 "application " | TOPICAL_CREAM | Freq: Two times a day (BID) | CUTANEOUS | 5 refills | Status: DC

## 2018-01-24 MED ORDER — PREGABALIN 75 MG PO CAPS: 75.0000 mg | ORAL_CAPSULE | Freq: Two times a day (BID) | ORAL | 1 refills | Status: DC

## 2018-01-24 MED ORDER — ALPRAZOLAM 0.5 MG PO TABS: 0.5000 mg | ORAL_TABLET | Freq: Every evening | ORAL | 0 refills | Status: DC | PRN

## 2018-01-24 MED ORDER — DULOXETINE HCL 60 MG PO CPEP: 60.0000 mg | ORAL_CAPSULE | Freq: Every day | ORAL | 1 refills | Status: DC

## 2018-01-24 NOTE — Progress Notes (Signed)
Subjective:    Patient ID: Joan Russo, female    DOB: 10/27/1956, 62 y.o.   MRN: [id number]  HPI This is a 62 yo female who presents today to establish care, is transferring from Dr. Deborra Medina.  She has not been seen in over a year. 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 Reads and watches TV, plays games on her computer, goes out weekly with her sister. Has a 14 yo granddaughter and 63 yo granddaughter who live with her.   Has noticed second toe discoloration. Occasionally gets swollen and painful. This seemed to start a month or so ago following a pedicure.   Rarely uses alprazolam, cites recent stressor regarding finances and having her grandchildren live with her. States she has leftover from previous prescription but it is long expired. Requests small amount today.   Also requests refill of clobetasol for chronic lichen sclerosus of labia.   Last CPE- greater than 1 year Mammo- 12/23/2016 Pap- 11/30/2008 Colonoscopy- 07/07/2016- 5 year recall Tdap- unknown Flu-declines Eye- regular Dental- over due Exercise- not regular  Breakfast- oatmeal, cream of wheat or cereal or Boost Lunch- skips Dinner- meat (usually chicken), vegetables, rice or potoates Drinks diet soda, 2 cans a day, little water or milk, occasional coffee  Past Medical History:  Diagnosis Date  . Anxiety   . Anxiety and depression   . Complication of anesthesia    woke up at beginning of procedure  . Depression   . Diverticulosis   . GERD (gastroesophageal reflux disease)   . Internal hemorrhoids   . OA (osteoarthritis)   . Obesity, Class III, BMI 40-49.9 (morbid obesity) ([place])   . Osteopenia   . Tubular adenoma   . Urinary incontinence   . Vitamin D deficiency    Past Surgical History:  Procedure Laterality Date  . COLONOSCOPY WITH PROPOFOL N/A 07/07/2016   Procedure: COLONOSCOPY WITH PROPOFOL;  Surgeon: Jerene Bears, MD;  Location: [facility];  Service: Gastroenterology;  Laterality: N/A;  .  ESOPHAGOGASTRODUODENOSCOPY (EGD) WITH PROPOFOL N/A 07/07/2016   Procedure: ESOPHAGOGASTRODUODENOSCOPY (EGD) WITH PROPOFOL;  Surgeon: Jerene Bears, MD;  Location[facility];  Service: Gastroenterology;  Laterality: N/A;  . HYSTEROSCOPY W/D&C N/A 01/16/2015   Procedure: DILATATION AND CURETTAGE /HYSTEROSCOPY;  Surgeon: Donnamae Jude, MD;  Location: [facility];  Service: Gynecology;  Laterality: N/A;  . TOTAL KNEE ARTHROPLASTY  08/2008   Left   Family History  Problem Relation Age of Onset  . Cancer Mother 36       breast  . Cancer Daughter 94       breast  . Cancer Father   . Colon cancer Neg Hx   . Esophageal cancer Neg Hx   . Rectal cancer Neg Hx   . Stomach cancer Neg Hx    Social History   Tobacco Use  . Smoking status: Never Smoker  . Smokeless tobacco: Never Used  Substance Use Topics  . Alcohol use: No    Alcohol/week: 0.0 oz  . Drug use: No      Review of Systems  Constitutional: Negative for fever and unexpected weight change.  Respiratory: Negative for cough, shortness of breath and wheezing.   Cardiovascular: Negative for chest pain, palpitations and leg swelling.  Genitourinary: Negative for difficulty urinating and dysuria.  Musculoskeletal:       Left second toe with intermittent swelling and pain.    Skin: Positive for color change (left second toe pain and swelling).  Neurological: Negative for headaches.  Objective:   Physical Exam  Constitutional: She is oriented to person, place, and time. She appears well-developed and well-nourished. No distress.  Morbidly obese.   HENT:  Head: Normocephalic and atraumatic.  Eyes: Conjunctivae are normal.  Cardiovascular: Normal rate, regular rhythm and normal heart sounds.  Pulmonary/Chest: Effort normal and breath sounds normal.  Musculoskeletal: She exhibits no edema.  Neurological: She is alert and oriented to person, place, and time.  Skin: Skin is warm and dry. She is not diaphoretic.  Left second toe  with small area scabbing. Normal ROM, no erythema.   Psychiatric: She has a normal mood and affect. Her behavior is normal. Judgment and thought content normal.  Vitals reviewed.     BP 128/76   Pulse 62   Temp 97.8 F (36.6 C) (Oral)   Wt 287 lb 12 oz (130.5 kg)   SpO2 99%   BMI 60.14 kg/m  Wt Readings from Last 3 Encounters:  01/24/18 287 lb 12 oz (130.5 kg)  12/15/16 280 lb 8 oz (127.2 kg)  07/07/16 280 lb (127 kg)       Assessment & Plan:  1. Encounter to establish care - Discussed and encouraged healthy lifestyle choices- adequate sleep, regular exercise, stress management and healthy food choices.   2. Lichen sclerosus of female genitalia - clobetasol cream (TEMOVATE) 0.05 %; Apply 1 application topically 2 (two) times daily. X 6 wks, then drop to once daily x 6 wks, then 2x/wk  Dispense: 30 g; Refill: 5  3. Anxiety and depression - patient feels well on current meds - DULoxetine (CYMBALTA) 60 MG capsule; Take 1 capsule (60 mg total) by mouth daily.  Dispense: 90 capsule; Refill: 1 - ALPRAZolam (XANAX) 0.5 MG tablet; Take 1 tablet (0.5 mg total) by mouth at bedtime as needed for anxiety.  Dispense: 15 tablet; Refill: 0  4. Neuropathic pain - pregabalin (LYRICA) 75 MG capsule; Take 1 capsule (75 mg total) by mouth 2 (two) times daily.  Dispense: 180 capsule; Refill: 1  5. Morbid obesity with BMI of 60.0-69.9, adult ([place]) - discussed her diet and encouraged her to increase frequency of meals/snacks, avoid skipping meals, eat protein and carbs at every meal/snack, decrease diet soda intake  - follow up in 6 months for CPE  Clarene Reamer, FNP-BC  [facility]  01/24/2018 1:56 PM

## 2018-01-24 NOTE — Patient Instructions (Signed)
Good to see you today  Please schedule your annual visit for 6 months  Let me know if toe gets worse- can send you to podiatry

## 2018-01-25 ENCOUNTER — Telehealth: Payer: Self-pay | Admitting: *Deleted

## 2018-01-25 NOTE — Telephone Encounter (Signed)
Fax received indicating PA required for lyrica. PA completed via covermymeds; awaiting response

## 2018-01-27 NOTE — Telephone Encounter (Signed)
Pt's lyrica Rx has been denied. Although she is currently on cymbalta, she has to also try and fail gabapentin and a tricyclic antidepression (amitriptyline, desipramine or nortriptyline.)  Pt needing new Rx

## 2018-01-27 NOTE — Telephone Encounter (Signed)
There is a note in the record from 3/18 where patient assistance forms were completed. Can you see if the patient can complete these for 2019? She has been on Lyrica since 2016.

## 2018-01-27 NOTE — Telephone Encounter (Signed)
Spoke to pt who states the patient assistance forms are only available for pts with no insurance and so unfortunately she no longer qualifies. Confirmed with pt she has never taken any of medications required.

## 2018-01-27 NOTE — Telephone Encounter (Signed)
Please put in my box and I will call and appeal.

## 2018-01-28 NOTE — Telephone Encounter (Signed)
It has been sent for scanning and will be available in pt chart

## 2018-02-02 ENCOUNTER — Encounter: Payer: Self-pay | Admitting: Family Medicine

## 2018-02-02 NOTE — Telephone Encounter (Signed)
Please call patient and let her know that I have written an appeal letter for her Lyrica. If it is denied, the next step would be for her to appeal.

## 2018-02-02 NOTE — Telephone Encounter (Signed)
Letter written and sent to be faxed

## 2018-02-03 NOTE — Telephone Encounter (Signed)
Spoke to pt and advised per [employer]. Pt states she has enough meds to last for an additional 71mos that she received when she was in the assistance program. She will await a response regarding appeal

## 2018-02-07 ENCOUNTER — Other Ambulatory Visit: Payer: Self-pay | Admitting: Family Medicine

## 2018-02-07 NOTE — Telephone Encounter (Signed)
Copied from [place]. Topic: Quick Communication - Rx Refill/Question >> Feb 07, 2018  1:45 PM Aurelio Brash B wrote: Medication: diclofenac (VOLTAREN) 75 MG EC tablet   Has the patient contacted their pharmacy? No   (Agent: If no, request that the patient contact the pharmacy for the refill.)   Preferred Pharmacy (with phone number or street name): [id number] [place], [place] [phone] (Phone) [phone] (Fax)  **   Agent: Please be advised that RX refills may take up to 3 business days. We ask that you follow-up with your pharmacy.

## 2018-02-07 NOTE — Telephone Encounter (Signed)
TC to patient with questions regarding Voltaren refill she has requested. She had a recent visit with Glenda Chroman, FNP to establish care which I saw no note regarding Voltaren. It was last prescribed by Dr. Deborra Medina on 01/25/17.

## 2018-02-08 ENCOUNTER — Other Ambulatory Visit: Payer: Self-pay | Admitting: Family Medicine

## 2018-02-08 MED ORDER — DICLOFENAC SODIUM 75 MG PO TBEC: 75.0000 mg | DELAYED_RELEASE_TABLET | Freq: Two times a day (BID) | ORAL | 1 refills | Status: DC

## 2018-02-08 NOTE — Telephone Encounter (Signed)
Please call patient and let her know that I sent in a refill for her diclofenac tablets.

## 2018-02-09 NOTE — Telephone Encounter (Signed)
Spoke to pt and advised Rx has been sent.

## 2018-04-01 ENCOUNTER — Other Ambulatory Visit: Payer: Self-pay | Admitting: Family Medicine

## 2018-04-01 DIAGNOSIS — M792 Neuralgia and neuritis, unspecified: Secondary | ICD-10-CM

## 2018-04-01 MED ORDER — PREGABALIN 75 MG PO CAPS
75.0000 mg | ORAL_CAPSULE | Freq: Two times a day (BID) | ORAL | 3 refills | Status: DC
Start: 2018-04-01 — End: 2018-08-26

## 2018-04-01 NOTE — Progress Notes (Signed)
Printed prescription provided for fax to [employer] patient assistance program.

## 2018-05-04 ENCOUNTER — Telehealth: Payer: Self-pay | Admitting: Emergency Medicine

## 2018-05-04 NOTE — Telephone Encounter (Signed)
Called and left voicemail for pt to return call to office. Informing her that I received that packet from [employer] patient assistance program. However there is a patient portion that needs to be completed and submitted. I can leave this this portion at the front office for her to come complete or what ever is convenient for her. Please check with patient upon her return call to see what is best.

## 2018-05-19 ENCOUNTER — Telehealth: Payer: Self-pay | Admitting: Family Medicine

## 2018-05-19 NOTE — Telephone Encounter (Signed)
Copied from [place] [id number]. Topic: Quick Communication - See Telephone Encounter >> May 19, 2018  2:13 PM Bea Graff, NT wrote: CRM for notification. See Telephone encounter for: 05/19/18. Pt calling and states her insurance would not approve the pregabalin (LYRICA) 75 MG capsule and they are wanting her to try gabapentin in place of this medication. CB#: [phone]

## 2018-05-20 ENCOUNTER — Other Ambulatory Visit: Payer: Self-pay | Admitting: Family Medicine

## 2018-05-20 DIAGNOSIS — M2550 Pain in unspecified joint: Secondary | ICD-10-CM

## 2018-05-20 DIAGNOSIS — M797 Fibromyalgia: Secondary | ICD-10-CM

## 2018-05-20 MED ORDER — GABAPENTIN 100 MG PO CAPS: 100.0000 mg | ORAL_CAPSULE | Freq: Two times a day (BID) | ORAL | 5 refills | Status: DC

## 2018-05-20 NOTE — Telephone Encounter (Signed)
Please call her and find out how much Lyrica she has, we need to taper it to avoid withdrawal symptoms. Once I know how many pills she has left, we will develop a plan for weaning Lyrica and starting gabapentin.

## 2018-05-20 NOTE — Telephone Encounter (Signed)
Please call patient and tell her to decrease her lyrica to once in the morning until all gone. After 1 week of once a day, I want her to start the gabapentin that I have sent to her pharmacy. She is to take this in the evening for one week then once she is off the lyrica she is to take the gabapentin twice a day. Please follow up in 4-6 weeks.

## 2018-05-20 NOTE — Telephone Encounter (Signed)
Called and spoke with patient informing her of instructions. Patient verbalized understanding and said she was writing the instructions down. Nothing further needed at this time.

## 2018-05-20 NOTE — Telephone Encounter (Signed)
Called and spoke to patient she states that she has insurance now so she is not sure that they will cover it That the reason she has not came to get the forms completed.  Wondering if she can have Gabapentin because that is what insurance wanted her to try first. I informed her that I would rely the message and get back to her as soon as I get clarification. Understanding verbalized.

## 2018-05-20 NOTE — Telephone Encounter (Signed)
Please call patient and let her know that we have completed our portion of patient assistance paperwork and have the forms available for her to do her part.

## 2018-05-20 NOTE — Telephone Encounter (Signed)
Called and spoke with patient she states that's she has 14 pills left and would like to start the weaning process.

## 2018-06-20 ENCOUNTER — Other Ambulatory Visit: Payer: Self-pay | Admitting: Family Medicine

## 2018-06-20 DIAGNOSIS — M2550 Pain in unspecified joint: Secondary | ICD-10-CM

## 2018-06-20 DIAGNOSIS — M797 Fibromyalgia: Secondary | ICD-10-CM

## 2018-07-04 ENCOUNTER — Other Ambulatory Visit: Payer: Self-pay | Admitting: Family Medicine

## 2018-07-04 DIAGNOSIS — F32A Depression, unspecified: Secondary | ICD-10-CM

## 2018-07-04 DIAGNOSIS — F329 Major depressive disorder, single episode, unspecified: Secondary | ICD-10-CM

## 2018-07-04 DIAGNOSIS — F419 Anxiety disorder, unspecified: Secondary | ICD-10-CM

## 2018-07-22 ENCOUNTER — Encounter: Payer: BLUE CROSS/BLUE SHIELD | Admitting: Family Medicine

## 2018-08-15 ENCOUNTER — Other Ambulatory Visit: Payer: Self-pay | Admitting: Family Medicine

## 2018-08-19 NOTE — Telephone Encounter (Signed)
I spoke with Leafy Ro RN and she said OK to fill until pt had CPX 08/26/18. Refilled diclofenac 75 mg EC # 60 to [facility].

## 2018-08-19 NOTE — Telephone Encounter (Signed)
Voltaren refill Last Refill:02/08/18 # 180 Last OV: 01/24/18 PCP: Carlean Purl Pharmacy:[facility]

## 2018-08-19 NOTE — Telephone Encounter (Signed)
Pt is out of medication

## 2018-08-26 ENCOUNTER — Telehealth: Payer: Self-pay | Admitting: Family Medicine

## 2018-08-26 ENCOUNTER — Other Ambulatory Visit: Payer: Self-pay | Admitting: *Deleted

## 2018-08-26 ENCOUNTER — Encounter: Payer: Self-pay | Admitting: Family Medicine

## 2018-08-26 ENCOUNTER — Ambulatory Visit (INDEPENDENT_AMBULATORY_CARE_PROVIDER_SITE_OTHER): Payer: BLUE CROSS/BLUE SHIELD | Admitting: Family Medicine

## 2018-08-26 ENCOUNTER — Other Ambulatory Visit (HOSPITAL_COMMUNITY)
Admission: RE | Admit: 2018-08-26 | Discharge: 2018-08-26 | Disposition: A | Discharge status: Home / Self Care | Payer: BLUE CROSS/BLUE SHIELD | Source: Ambulatory Visit | Attending: Family Medicine | Admitting: Family Medicine

## 2018-08-26 VITALS — BP 168/78 | HR 103 | Ht <= 58 in | Wt 289.8 lb

## 2018-08-26 DIAGNOSIS — Z6841 Body Mass Index (BMI) 40.0 and over, adult: Secondary | ICD-10-CM | POA: Diagnosis not present

## 2018-08-26 DIAGNOSIS — Z124 Encounter for screening for malignant neoplasm of cervix: Secondary | ICD-10-CM | POA: Insufficient documentation

## 2018-08-26 DIAGNOSIS — I159 Secondary hypertension, unspecified: Secondary | ICD-10-CM

## 2018-08-26 DIAGNOSIS — Z1321 Encounter for screening for nutritional disorder: Secondary | ICD-10-CM | POA: Diagnosis not present

## 2018-08-26 DIAGNOSIS — N904 Leukoplakia of vulva: Secondary | ICD-10-CM

## 2018-08-26 DIAGNOSIS — Z0000 Encounter for general adult medical examination without abnormal findings: Secondary | ICD-10-CM

## 2018-08-26 DIAGNOSIS — E559 Vitamin D deficiency, unspecified: Secondary | ICD-10-CM

## 2018-08-26 DIAGNOSIS — M2550 Pain in unspecified joint: Secondary | ICD-10-CM

## 2018-08-26 DIAGNOSIS — E039 Hypothyroidism, unspecified: Secondary | ICD-10-CM

## 2018-08-26 LAB — CBC WITH DIFFERENTIAL/PLATELET
BASOS ABS: 0.1 10*3/uL (ref 0.0–0.1)
Basophils Relative: 0.8 % (ref 0.0–3.0)
EOS ABS: 0.4 10*3/uL (ref 0.0–0.7)
Eosinophils Relative: 5 % (ref 0.0–5.0)
HCT: 43.2 % (ref 36.0–46.0)
Hemoglobin: 14.4 g/dL (ref 12.0–15.0)
Lymphocytes Relative: 41.9 % (ref 12.0–46.0)
Lymphs Abs: 3.7 10*3/uL (ref 0.7–4.0)
MCHC: 33.4 g/dL (ref 30.0–36.0)
MCV: 89.7 fl (ref 78.0–100.0)
MONOS PCT: 4.9 % (ref 3.0–12.0)
Monocytes Absolute: 0.4 10*3/uL (ref 0.1–1.0)
Neutro Abs: 4.2 10*3/uL (ref 1.4–7.7)
Neutrophils Relative %: 47.4 % (ref 43.0–77.0)
Platelets: 284 10*3/uL (ref 150.0–400.0)
RBC: 4.81 Mil/uL (ref 3.87–5.11)
RDW: 14.2 % (ref 11.5–15.5)
WBC: 8.8 10*3/uL (ref 4.0–10.5)

## 2018-08-26 LAB — TSH: TSH: 5.29 u[IU]/mL — AB (ref 0.35–4.50)

## 2018-08-26 LAB — COMPREHENSIVE METABOLIC PANEL
ALT: 26 U/L (ref 0–35)
AST: 21 U/L (ref 0–37)
Albumin: 4 g/dL (ref 3.5–5.2)
Alkaline Phosphatase: 79 U/L (ref 39–117)
BILIRUBIN TOTAL: 0.4 mg/dL (ref 0.2–1.2)
BUN: 15 mg/dL (ref 6–23)
CHLORIDE: 106 meq/L (ref 96–112)
CO2: 28 meq/L (ref 19–32)
Calcium: 9.1 mg/dL (ref 8.4–10.5)
Creatinine, Ser: 0.84 mg/dL (ref 0.40–1.20)
GFR: 72.46 mL/min (ref >60.00)
GLUCOSE: 109 mg/dL — AB (ref 70–99)
Potassium: 4.1 mEq/L (ref 3.5–5.1)
Sodium: 141 mEq/L (ref 135–145)
Total Protein: 7.2 g/dL (ref 6.0–8.3)

## 2018-08-26 LAB — LIPID PANEL
CHOLESTEROL: 192 mg/dL (ref 0–200)
HDL: 53.2 mg/dL (ref >39.00)
LDL CALC: 121 mg/dL — AB (ref 0–99)
NonHDL: 138.92
TRIGLYCERIDES: 89 mg/dL (ref 0.0–149.0)
Total CHOL/HDL Ratio: 4
VLDL: 17.8 mg/dL (ref 0.0–40.0)

## 2018-08-26 LAB — HEMOGLOBIN A1C: Hgb A1c MFr Bld: 6 % (ref 4.6–6.5)

## 2018-08-26 LAB — VITAMIN D 25 HYDROXY (VIT D DEFICIENCY, FRACTURES): VITD: 17.15 ng/mL — ABNORMAL LOW (ref 30.00–100.00)

## 2018-08-26 MED ORDER — DICLOFENAC SODIUM 75 MG PO TBEC: 75.0000 mg | DELAYED_RELEASE_TABLET | Freq: Two times a day (BID) | ORAL | 1 refills | Status: DC

## 2018-08-26 MED ORDER — HYDROCHLOROTHIAZIDE 12.5 MG PO CAPS: 12.5000 mg | ORAL_CAPSULE | Freq: Every day | ORAL | 1 refills | Status: DC

## 2018-08-26 MED ORDER — METHOCARBAMOL 500 MG PO TABS
500.0000 mg | ORAL_TABLET | Freq: Three times a day (TID) | ORAL | 1 refills | Status: DC | PRN
Start: 2018-08-26 — End: 2018-10-26

## 2018-08-26 NOTE — Telephone Encounter (Signed)
Copied from [place] [id number]. Topic: Quick Communication - Rx Refill/Question >> Aug 26, 2018  3:52 PM Reyne Dumas L wrote: Medication: hydrochlorothiazide (MICROZIDE) 12.5 MG capsule  Has the patient contacted their pharmacy? No - thought she had some at home, but realizes she doesn't.  Needs RX sent to pharmacy. (Agent: If no, request that the patient contact the pharmacy for the refill.) (Agent: If yes, when and what did the pharmacy advise?)  Preferred Pharmacy (with phone number or street name): [id number] [place], [address] [phone] (Phone) [phone] (Fax)  Agent: Please be advised that RX refills may take up to 3 business days. We ask that you follow-up with your pharmacy.

## 2018-08-26 NOTE — Patient Instructions (Signed)
Great to see you today  Look for chair yoga that you can do during the day (you tube videos for gentle/chair yoga)  Increase protein to 20-30 grams per meal (at least 2x/day) and aim for 5 servings of vegetables a day  Please call and schedule an appointment for screening mammogram. A referral is not needed.  Joan Russo  [phone]   Follow up in 6 months, sooner if needed  Consider follow up with your orthopedic doctor for knee pain   Preventive Care 40-64 Years, Female Preventive care refers to lifestyle choices and visits with your health care provider that can promote health and wellness. What does preventive care include?  A yearly physical exam. This is also called an annual well check.  Dental exams once or twice a year.  Routine eye exams. Ask your health care provider how often you should have your eyes checked.  Personal lifestyle choices, including: ? Daily care of your teeth and gums. ? Regular physical activity. ? Eating a healthy diet. ? Avoiding tobacco and drug use. ? Limiting alcohol use. ? Practicing safe sex. ? Taking low-dose aspirin daily starting at age 9. ? Taking vitamin and mineral supplements as recommended by your health care provider. What happens during an annual well check? The services and screenings done by your health care provider during your annual well check will depend on your age, overall health, lifestyle risk factors, and family history of disease. Counseling Your health care provider may ask you questions about your:  Alcohol use.  Tobacco use.  Drug use.  Emotional well-being.  Home and relationship well-being.  Sexual activity.  Eating habits.  Work and work Statistician.  Method of birth control.  Menstrual cycle.  Pregnancy history.  Screening You may have the following tests or measurements:  Height, weight,  and BMI.  Blood pressure.  Lipid and cholesterol levels. These may be checked every 5 years, or more frequently if you are over 76 years old.  Skin check.  Lung cancer screening. You may have this screening every year starting at age 6 if you have a 30-pack-year history of smoking and currently smoke or have quit within the past 15 years.  Fecal occult blood test (FOBT) of the stool. You may have this test every year starting at age 57.  Flexible sigmoidoscopy or colonoscopy. You may have a sigmoidoscopy every 5 years or a colonoscopy every 10 years starting at age 64.  Hepatitis C blood test.  Hepatitis B blood test.  Sexually transmitted disease (STD) testing.  Diabetes screening. This is done by checking your blood sugar (glucose) after you have not eaten for a while (fasting). You may have this done every 1-3 years.  Mammogram. This may be done every 1-2 years. Talk to your health care provider about when you should start having regular mammograms. This may depend on whether you have a family history of breast cancer.  BRCA-related cancer screening. This may be done if you have a family history of breast, ovarian, tubal, or peritoneal cancers.  Pelvic exam and Pap test. This may be done every 3 years starting at age 35. Starting at age 22, this may be done every 5 years if you have a Pap test in combination with an HPV test.  Bone density scan. This is done to screen for osteoporosis. You may have this scan if you are at high risk for osteoporosis.  Discuss your test results,  treatment options, and if necessary, the need for more tests with your health care provider. Vaccines Your health care provider may recommend certain vaccines, such as:  Influenza vaccine. This is recommended every year.  Tetanus, diphtheria, and acellular pertussis (Tdap, Td) vaccine. You may need a Td booster every 10 years.  Varicella vaccine. You may need this if you have not been  vaccinated.  Zoster vaccine. You may need this after age 71.  Measles, mumps, and rubella (MMR) vaccine. You may need at least one dose of MMR if you were born in 1957 or later. You may also need a second dose.  Pneumococcal 13-valent conjugate (PCV13) vaccine. You may need this if you have certain conditions and were not previously vaccinated.  Pneumococcal polysaccharide (PPSV23) vaccine. You may need one or two doses if you smoke cigarettes or if you have certain conditions.  Meningococcal vaccine. You may need this if you have certain conditions.  Hepatitis A vaccine. You may need this if you have certain conditions or if you travel or work in places where you may be exposed to hepatitis A.  Hepatitis B vaccine. You may need this if you have certain conditions or if you travel or work in places where you may be exposed to hepatitis B.  Haemophilus influenzae type b (Hib) vaccine. You may need this if you have certain conditions.  Talk to your health care provider about which screenings and vaccines you need and how often you need them. This information is not intended to replace advice given to you by your health care provider. Make sure you discuss any questions you have with your health care provider. Document Released: 01/10/2016 Document Revised: 09/02/2016 Document Reviewed: 10/15/2015 Elsevier Interactive Patient Education  [employer].

## 2018-08-26 NOTE — Progress Notes (Signed)
Subjective:    Patient ID: Joan Russo, female    DOB: 1956-08-15, 62 y.o.   MRN: [id number]  HPI This is a 62 yo female who presents today for CPE. She continues to work from home and enjoys her job. Her 26 yo granddaughter lives with her; her 83 yo granddaughter moved to her own place which has made things better at home.    Last CPE- greater than 1 year Mammo- 12/23/2016 Pap- 11/30/2008 Colonoscopy- 07/07/2016- 5 year recall Tdap- unknown Flu-declines Eye- regular Dental- over due Exercise- not regular Diet- usually skips breakfast, lunch is a bowl of Special K cereal, dinner- baked chicken, pasta, vegetables. No soda.   Polyarthralgia- doing ok on gabapentin (insurance denied Lyrica that she was on for years). Was on diclofenac sodium for many years with some improvement and she took cyclobenzaprine with good relief of pain (an old prescription she had at home). Has had more pain with walking. Has chronic knee pain. Has had intermittent injections which seemed to work at first, last injection into left knee did not provide pain relief. Activity level significantly declined with knee pain.   Past Medical History:  Diagnosis Date  . Anxiety   . Anxiety and depression   . Complication of anesthesia    woke up at beginning of procedure  . Depression   . Diverticulosis   . GERD (gastroesophageal reflux disease)   . Internal hemorrhoids   . OA (osteoarthritis)   . Obesity, Class III, BMI 40-49.9 (morbid obesity) ([place])   . Osteopenia   . Tubular adenoma   . Urinary incontinence   . Vitamin D deficiency    Past Surgical History:  Procedure Laterality Date  . COLONOSCOPY WITH PROPOFOL N/A 07/07/2016   Procedure: COLONOSCOPY WITH PROPOFOL;  Surgeon: Jerene Bears, MD;  Location: [facility];  Service: Gastroenterology;  Laterality: N/A;  . ESOPHAGOGASTRODUODENOSCOPY (EGD) WITH PROPOFOL N/A 07/07/2016   Procedure: ESOPHAGOGASTRODUODENOSCOPY (EGD) WITH PROPOFOL;  Surgeon: Jerene Bears, MD;  Location: [facility];  Service: Gastroenterology;  Laterality: N/A;  . HYSTEROSCOPY W/D&C N/A 01/16/2015   Procedure: DILATATION AND CURETTAGE /HYSTEROSCOPY;  Surgeon: Donnamae Jude, MD;  Location: [facility];  Service: Gynecology;  Laterality: N/A;  . TOTAL KNEE ARTHROPLASTY  08/2008   Left   Family History  Problem Relation Age of Onset  . Cancer Mother 40       breast  . Cancer Daughter 24       breast  . Cancer Father   . Colon cancer Neg Hx   . Esophageal cancer Neg Hx   . Rectal cancer Neg Hx   . Stomach cancer Neg Hx    Social History   Tobacco Use  . Smoking status: Never Smoker  . Smokeless tobacco: Never Used  Substance Use Topics  . Alcohol use: No    Alcohol/week: 0.0 standard drinks  . Drug use: No      Review of Systems  Constitutional: Negative.   HENT: Negative.   Eyes: Negative.   Respiratory: Negative.   Cardiovascular: Negative.   Gastrointestinal: Negative.   Endocrine: Negative.   Genitourinary:       Lichen sclerosus with occasional itching, uses clobetisol prn.   Musculoskeletal: Positive for arthralgias.  Skin: Negative.   Allergic/Immunologic: Negative.   Neurological: Negative.   Hematological: Negative.   Psychiatric/Behavioral: Negative.        Objective:   Physical Exam Physical Exam  Constitutional: She is oriented to person, place, and  time. She appears well-developed and well-nourished. No distress.  HENT:  Head: Normocephalic and atraumatic.  Right Ear: External ear normal.  Left Ear: External ear normal.  Nose: Nose normal.  Mouth/Throat: Oropharynx is clear and moist. No oropharyngeal exudate.  Eyes: Conjunctivae are normal. Pupils are equal, round, and reactive to light.  Neck: Normal range of motion. Neck supple. No JVD present. No thyromegaly present.  Cardiovascular: Normal rate, regular rhythm, normal heart sounds and intact distal pulses.   Pulmonary/Chest: Effort normal and breath sounds normal. Right  breast exhibits no inverted nipple, no mass, no nipple discharge, no skin change and no tenderness. Left breast exhibits no inverted nipple, no mass, no nipple discharge, no skin change and no tenderness. Breasts are symmetrical.  Abdominal: Soft. Bowel sounds are normal. She exhibits no distension and no mass. There is no tenderness. There is no rebound and no guarding.  Genitourinary: Vagina normal. Pelvic exam was performed with patient supine. Perineum is absent of hair. Small labia, areas of pigment loss, no erythema, vagina without rugae. Cervix exhibits no motion tenderness and no discharge. No vaginal discharge found.  Musculoskeletal: Normal range of motion. She exhibits no edema or tenderness.  Lymphadenopathy:    She has no cervical adenopathy.  Neurological: She is alert and oriented to person, place, and time. She has normal reflexes.  Skin: Skin is warm and dry. She is not diaphoretic.  Psychiatric: She has a normal mood and affect. Her behavior is normal. Judgment and thought content normal.  Vitals reviewed.    BP (!) 168/78 (BP Location: Right Arm, Patient Position: Sitting, Cuff Size: Normal)   Pulse (!) 103   Ht 4\' 9"  (1.448 m)   Wt 289 lb 12.8 oz (131.5 kg)   SpO2 95%   BMI 62.71 kg/m    Wt Readings from Last 3 Encounters:  08/26/18 289 lb 12.8 oz (131.5 kg)  01/24/18 287 lb 12 oz (130.5 kg)  12/15/16 280 lb 8 oz (127.2 kg)   Depression screen [facility] 2/9 08/26/2018 06/08/2016  Decreased Interest 0 0  Down, Depressed, Hopeless 0 3  PHQ - 2 Score 0 3  Altered sleeping - 0  Tired, decreased energy - 3  Change in appetite - 0  Feeling bad or failure about yourself  - 1  Trouble concentrating - 3  Moving slowly or fidgety/restless - 1  Suicidal thoughts - 0  PHQ-9 Score - 11  Difficult doing work/chores - Somewhat difficult       Assessment & Plan:  1. Annual physical exam - Discussed and encouraged healthy lifestyle choices- adequate sleep, regular exercise,  stress management and healthy food choices.    2. Secondary hypertension - elevated today, she has not taken her medications, will recheck at follow up - Comprehensive metabolic panel - Hemoglobin A1c - CBC with Differential - TSH - Lipid Panel  3. Morbid obesity with BMI of 60.0-69.9, adult ([place]) - encouraged her to increase protein, vegetables, eat regularly - Comprehensive metabolic panel - Hemoglobin A1c - CBC with Differential - TSH - Lipid Panel  4. Encounter for vitamin deficiency screening - Vitamin D, 25-hydroxy  5. Cervical cancer screening - Cytology - PAP  6. Lichen sclerosus of female genitalia - continue clobetasol prn  7. Polyarthralgia - diclofenac 75 mg po BID prn - methocarbamol 500 mg po q8 hours prn  Clarene Reamer, FNP-BC  [facility]  08/26/2018 1:56 PM

## 2018-08-30 LAB — CYTOLOGY - PAP
DIAGNOSIS: NEGATIVE
HPV: NOT DETECTED

## 2018-08-31 MED ORDER — LEVOTHYROXINE SODIUM 50 MCG PO TABS: 50.0000 ug | ORAL_TABLET | Freq: Every day | ORAL | 3 refills | Status: DC

## 2018-08-31 MED ORDER — VITAMIN D3 1.25 MG (50000 UT) PO TABS: 1.0000 | ORAL_TABLET | ORAL | 3 refills | Status: DC

## 2018-08-31 NOTE — Addendum Note (Signed)
Addended by: Clarene Reamer B on: 08/31/2018 07:59 AM   Modules accepted: Orders

## 2018-10-11 ENCOUNTER — Other Ambulatory Visit: Payer: Self-pay | Admitting: Family Medicine

## 2018-10-11 DIAGNOSIS — M2550 Pain in unspecified joint: Secondary | ICD-10-CM

## 2018-10-11 DIAGNOSIS — M797 Fibromyalgia: Secondary | ICD-10-CM

## 2018-10-17 ENCOUNTER — Ambulatory Visit: Payer: BLUE CROSS/BLUE SHIELD | Admitting: Family Medicine

## 2018-10-17 ENCOUNTER — Encounter: Payer: Self-pay | Admitting: Family Medicine

## 2018-10-17 VITALS — BP 154/78 | HR 83 | Temp 98.2°F | Ht <= 58 in | Wt 289.0 lb

## 2018-10-17 DIAGNOSIS — F339 Major depressive disorder, recurrent, unspecified: Secondary | ICD-10-CM

## 2018-10-17 DIAGNOSIS — Z23 Encounter for immunization: Secondary | ICD-10-CM | POA: Diagnosis not present

## 2018-10-17 MED ORDER — BUPROPION HCL ER (SR) 150 MG PO TB12: 150.0000 mg | ORAL_TABLET | Freq: Two times a day (BID) | ORAL | 3 refills | Status: DC

## 2018-10-17 NOTE — Progress Notes (Signed)
Subjective:    Patient ID: Joan Russo, female    DOB: 24-Jan-1956, 62 y.o.   MRN: [id number]  HPI This is a 62 yo female who presents today with worsening depression. She was seen 08/26/18 for annual exam. At that time she denied any symptoms. She presents today stating that she, "lied," She feels the need to always put on a happy face. She has not wanted to get out of bed, she doesn't know of any triggers for her depression except possibly the change of seasons to fall which always gets her down. She knows that she has a lot to be grateful for but still feels sad and lacking in motivation. She works from home and has a job she loves with a good boss. Things are going well with her 64 yo. She and her sister are close and get out weekly to run errands. She has 3 good friends. Her knees have been feeling better so she she has been able to walk more when running errands. Doesn't currently have a church to attend, is considering going to her daughter's church. She has plans to go to [place] to see Nelda Bucks this weekend in [place] and can't understand why she isn't excited.  Using CPAP at night, sleeps well but doesn't want to get out of bed.   Past Medical History:  Diagnosis Date  . Anxiety   . Anxiety and depression   . Complication of anesthesia    woke up at beginning of procedure  . Depression   . Diverticulosis   . GERD (gastroesophageal reflux disease)   . Internal hemorrhoids   . OA (osteoarthritis)   . Obesity, Class III, BMI 40-49.9 (morbid obesity) ([place])   . Osteopenia   . Tubular adenoma   . Urinary incontinence   . Vitamin D deficiency    Past Surgical History:  Procedure Laterality Date  . COLONOSCOPY WITH PROPOFOL N/A 07/07/2016   Procedure: COLONOSCOPY WITH PROPOFOL;  Surgeon: Jerene Bears, MD;  Location: [facility];  Service: Gastroenterology;  Laterality: N/A;  . ESOPHAGOGASTRODUODENOSCOPY (EGD) WITH PROPOFOL N/A 07/07/2016   Procedure:  ESOPHAGOGASTRODUODENOSCOPY (EGD) WITH PROPOFOL;  Surgeon: Jerene Bears, MD;  Location[facility];  Service: Gastroenterology;  Laterality: N/A;  . HYSTEROSCOPY W/D&C N/A 01/16/2015   Procedure: DILATATION AND CURETTAGE /HYSTEROSCOPY;  Surgeon: Donnamae Jude, MD;  Location: [facility];  Service: Gynecology;  Laterality: N/A;  . TOTAL KNEE ARTHROPLASTY  08/2008   Left   Family History  Problem Relation Age of Onset  . Cancer Mother 75       breast  . Cancer Daughter 89       breast  . Cancer Father   . Colon cancer Neg Hx   . Esophageal cancer Neg Hx   . Rectal cancer Neg Hx   . Stomach cancer Neg Hx    Social History   Tobacco Use  . Smoking status: Never Smoker  . Smokeless tobacco: Never Used  Substance Use Topics  . Alcohol use: No    Alcohol/week: 0.0 standard drinks  . Drug use: No      Review of Systems Per HPI    Objective:   Physical Exam  Constitutional: She is oriented to person, place, and time. She appears well-developed and well-nourished. No distress.  Morbidly obese.   HENT:  Head: Normocephalic and atraumatic.  Eyes: Conjunctivae are normal.  Cardiovascular: Normal rate.  Pulmonary/Chest: Effort normal.  Neurological: She is alert and oriented to person,  place, and time.  Skin: Skin is warm and dry. She is not diaphoretic.  Psychiatric: She has a normal mood and affect. Her behavior is normal. Judgment and thought content normal.  Tearful at times.   Vitals reviewed.     Blood pressure (!) 154/78, pulse 83, temperature 98.2 F (36.8 C), temperature source Oral, height 4\' 9"  (1.448 m), weight 289 lb (131.1 kg), SpO2 96 %.  Wt Readings from Last 3 Encounters:  10/17/18 289 lb (131.1 kg)  08/26/18 289 lb 12.8 oz (131.5 kg)  01/24/18 287 lb 12 oz (130.5 kg)   Depression screen [facility] 2/9 10/17/2018 08/26/2018 06/08/2016  Decreased Interest 3 0 0  Down, Depressed, Hopeless 3 0 3  PHQ - 2 Score 6 0 3  Altered sleeping 3 - 0  Tired, decreased energy 3  - 3  Change in appetite 3 - 0  Feeling bad or failure about yourself  3 - 1  Trouble concentrating 0 - 3  Moving slowly or fidgety/restless 0 - 1  Suicidal thoughts 0 - 0  PHQ-9 Score 18 - 11  Difficult doing work/chores Very difficult - Somewhat difficult       Assessment & Plan:  1. Depression, recurrent ([place]) - Provided written and verbal information regarding diagnosis and treatment. - discussed counseling, but she was not interested, did not find it helpful in the past - encouraged her to get out every day, exercise 10-15 minutes, consider Silver Sneakers, eat mostly vegetables, fruits and lean proteins.  - Follow up in 4 weeks, sooner if worsening symptoms - buPROPion (WELLBUTRIN SR) 150 MG 12 hr tablet; Take 1 tablet (150 mg total) by mouth 2 (two) times daily.  Dispense: 60 tablet; Refill: 3  2. Need for influenza vaccination - Flu Vaccine QUAD 36+ mos IM   Clarene Reamer, FNP-BC  [facility]  10/17/2018 5:08 PM

## 2018-10-17 NOTE — Patient Instructions (Signed)
Good to see you today  I have sent in a new medication for you to take to help with your depression. Continue your other medications. You will take this medication once a day for 3 days, then increase to twice a day. Stay on twice a day. If you have any trouble with side effects, please let me know.   If your medication is expensive at [facility], it is $9 for 60 tablets at [facility]. You can always ask [facility] to not run it through your insurance  Please follow up in 4 weeks, sooner if symptoms worsen    Major Depressive Disorder, Adult Major depressive disorder (MDD) is a mental health condition. MDD often makes you feel sad, hopeless, or helpless. MDD can also cause symptoms in your body. MDD can affect your:  Work.  School.  Relationships.  Other normal activities.  MDD can range from mild to very bad. It may occur once (single episode MDD). It can also occur many times (recurrent MDD). The main symptoms of MDD often include:  Feeling sad, depressed, or irritable most of the time.  Loss of interest.  MDD symptoms also include:  Sleeping too much or too little.  Eating too much or too little.  A change in your weight.  Feeling tired (fatigue) or having low energy.  Feeling worthless.  Feeling guilty.  Trouble making decisions.  Trouble thinking clearly.  Thoughts of suicide or harming others.  Feeling weak.  Feeling agitated.  Keeping yourself from being around other people (isolation).  Follow these instructions at home: Activity  Do these things as told by your doctor: ? Go back to your normal activities. ? Exercise regularly. ? Spend time outdoors. Alcohol  Talk with your doctor about how alcohol can affect your antidepressant medicines.  Do not drink alcohol. Or, limit how much alcohol you drink. ? This means no more than 1 drink a day for nonpregnant women and 2 drinks a day for men. One drink equals one of these:  12 oz of beer.  5 oz of wine.  1  oz of hard liquor. General instructions  Take over-the-counter and prescription medicines only as told by your doctor.  Eat a healthy diet.  Get plenty of sleep.  Find activities that you enjoy. Make time to do them.  Think about joining a support group. Your doctor may be able to suggest a group for you.  Keep all follow-up visits as told by your doctor. This is important. Where to find more information:  [employer] on Mental Illness: ? www.nami.[place]: ? https://carter.com/  National Suicide Prevention Lifeline: ? 850-731-6418. This is free, 24-hour help. Contact a doctor if:  Your symptoms get worse.  You have new symptoms. Get help right away if:  You self-harm.  You see, hear, taste, smell, or feel things that are not present (hallucinate). If you ever feel like you may hurt yourself or others, or have thoughts about taking your own life, get help right away. You can go to your nearest emergency department or call:  Your local emergency services (911 in the [place]  A suicide crisis helpline, such as the National Suicide Prevention Lifeline: ? 432-192-5736. This is open 24 hours a day.  This information is not intended to replace advice given to you by your health care provider. Make sure you discuss any questions you have with your health care provider. Document Released: 11/25/2015 Document Revised: 08/30/2016 Document Reviewed: 08/30/2016 Elsevier  Interactive Patient Education  2017 [employer].  Bupropion extended-release tablets (Depression/Mood Disorders) What is this medicine? BUPROPION (byoo PROE pee on) is used to treat depression. This medicine may be used for other purposes; ask your health care provider or pharmacist if you have questions. COMMON BRAND NAME(S): Aplenzin, Budeprion XL, Forfivo XL, Wellbutrin XL What should I tell my health care provider before I take this medicine? They need to know if  you have any of these conditions: -an eating disorder, such as anorexia or bulimia -bipolar disorder or psychosis -diabetes or high blood sugar, treated with medication -glaucoma -head injury or brain tumor -heart disease, previous heart attack, or irregular heart beat -high blood pressure -kidney or liver disease -seizures (convulsions) -suicidal thoughts or a previous suicide attempt -Tourette's syndrome -weight loss -an unusual or allergic reaction to bupropion, other medicines, foods, dyes, or preservatives -breast-feeding -pregnant or trying to become pregnant How should I use this medicine? Take this medicine by mouth with a glass of water. Follow the directions on the prescription label. You can take it with or without food. If it upsets your stomach, take it with food. Do not crush, chew, or cut these tablets. This medicine is taken once daily at the same time each day. Do not take your medicine more often than directed. Do not stop taking this medicine suddenly except upon the advice of your doctor. Stopping this medicine too quickly may cause serious side effects or your condition may worsen. A special MedGuide will be given to you by the pharmacist with each prescription and refill. Be sure to read this information carefully each time. Talk to your pediatrician regarding the use of this medicine in children. Special care may be needed. Overdosage: If you think you have taken too much of this medicine contact a poison control center or emergency room at once. NOTE: This medicine is only for you. Do not share this medicine with others. What if I miss a dose? If you miss a dose, skip the missed dose and take your next tablet at the regular time. Do not take double or extra doses. What may interact with this medicine? Do not take this medicine with any of the following medications: -linezolid -MAOIs like Azilect, Carbex, Eldepryl, Marplan, Nardil, and Parnate -methylene blue  (injected into a vein) -other medicines that contain bupropion like Zyban This medicine may also interact with the following medications: -alcohol -certain medicines for anxiety or sleep -certain medicines for blood pressure like metoprolol, propranolol -certain medicines for depression or psychotic disturbances -certain medicines for HIV or AIDS like efavirenz, lopinavir, nelfinavir, ritonavir -certain medicines for irregular heart beat like propafenone, flecainide -certain medicines for Parkinson's disease like amantadine, levodopa -certain medicines for seizures like carbamazepine, phenytoin, phenobarbital -cimetidine -clopidogrel -cyclophosphamide -digoxin -furazolidone -isoniazid -nicotine -orphenadrine -procarbazine -steroid medicines like prednisone or cortisone -stimulant medicines for attention disorders, weight loss, or to stay awake -tamoxifen -theophylline -thiotepa -ticlopidine -tramadol -warfarin This list may not describe all possible interactions. Give your health care provider a list of all the medicines, herbs, non-prescription drugs, or dietary supplements you use. Also tell them if you smoke, drink alcohol, or use illegal drugs. Some items may interact with your medicine. What should I watch for while using this medicine? Tell your doctor if your symptoms do not get better or if they get worse. Visit your doctor or health care professional for regular checks on your progress. Because it may take several weeks to see the full effects of this medicine,  it is important to continue your treatment as prescribed by your doctor. Patients and their families should watch out for new or worsening thoughts of suicide or depression. Also watch out for sudden changes in feelings such as feeling anxious, agitated, panicky, irritable, hostile, aggressive, impulsive, severely restless, overly excited and hyperactive, or not being able to sleep. If this happens, especially at the  beginning of treatment or after a change in dose, call your health care professional. Avoid alcoholic drinks while taking this medicine. Drinking large amounts of alcoholic beverages, using sleeping or anxiety medicines, or quickly stopping the use of these agents while taking this medicine may increase your risk for a seizure. Do not drive or use heavy machinery until you know how this medicine affects you. This medicine can impair your ability to perform these tasks. Do not take this medicine close to bedtime. It may prevent you from sleeping. Your mouth may get dry. Chewing sugarless gum or sucking hard candy, and drinking plenty of water may help. Contact your doctor if the problem does not go away or is severe. The tablet shell for some brands of this medicine does not dissolve. This is normal. The tablet shell may appear whole in the stool. This is not a cause for concern. What side effects may I notice from receiving this medicine? Side effects that you should report to your doctor or health care professional as soon as possible: -allergic reactions like skin rash, itching or hives, swelling of the face, lips, or tongue -breathing problems -changes in vision -confusion -elevated mood, decreased need for sleep, racing thoughts, impulsive behavior -fast or irregular heartbeat -hallucinations, loss of contact with reality -increased blood pressure -redness, blistering, peeling or loosening of the skin, including inside the mouth -seizures -suicidal thoughts or other mood changes -unusually weak or tired -vomiting Side effects that usually do not require medical attention (report to your doctor or health care professional if they continue or are bothersome): -constipation -headache -loss of appetite -nausea -tremors -weight loss This list may not describe all possible side effects. Call your doctor for medical advice about side effects. You may report side effects to FDA at  1-800-FDA-1088. Where should I keep my medicine? Keep out of the reach of children. Store at room temperature between 15 and 30 degrees C (59 and 86 degrees F). Throw away any unused medicine after the expiration date. NOTE: This sheet is a summary. It may not cover all possible information. If you have questions about this medicine, talk to your doctor, pharmacist, or health care provider.  2018 Elsevier/Gold Standard (2016-06-05 13:55:13)

## 2018-10-24 ENCOUNTER — Encounter: Payer: Self-pay | Admitting: Family Medicine

## 2018-10-26 ENCOUNTER — Other Ambulatory Visit: Payer: Self-pay | Admitting: Family Medicine

## 2018-10-26 DIAGNOSIS — M2550 Pain in unspecified joint: Secondary | ICD-10-CM

## 2018-10-31 NOTE — Telephone Encounter (Signed)
Last filled 08/26/18 #60 refills 1  Last OV 10/17/18

## 2018-11-08 ENCOUNTER — Other Ambulatory Visit: Payer: Self-pay | Admitting: Family Medicine

## 2018-11-08 DIAGNOSIS — F339 Major depressive disorder, recurrent, unspecified: Secondary | ICD-10-CM

## 2018-11-21 ENCOUNTER — Encounter: Payer: Self-pay | Admitting: Family Medicine

## 2018-11-21 ENCOUNTER — Ambulatory Visit: Payer: BLUE CROSS/BLUE SHIELD | Admitting: Family Medicine

## 2018-11-21 VITALS — BP 144/70 | HR 78 | Temp 97.7°F | Ht <= 58 in | Wt 289.8 lb

## 2018-11-21 DIAGNOSIS — F339 Major depressive disorder, recurrent, unspecified: Secondary | ICD-10-CM | POA: Diagnosis not present

## 2018-11-21 NOTE — Progress Notes (Signed)
Subjective:    Patient ID: Joan Russo, female    DOB: Dec 29, 1955, 62 y.o.   MRN: [id number]  HPI This is a 62 yo female who presents today for follow up of depression. Was seen last month and started on  Duloxetine 60 mg bid. Had some side effects initially but was able to increase to bid and is tolerating well without side effects. She reports significantly improved mood and energy.  Knee pain improved with diclofenac, able to do her regular activities without limitation.  Past Medical History:  Diagnosis Date  . Anxiety   . Anxiety and depression   . Complication of anesthesia    woke up at beginning of procedure  . Depression   . Diverticulosis   . GERD (gastroesophageal reflux disease)   . Internal hemorrhoids   . OA (osteoarthritis)   . Obesity, Class III, BMI 40-49.9 (morbid obesity) ([place])   . Osteopenia   . Tubular adenoma   . Urinary incontinence   . Vitamin D deficiency    Past Surgical History:  Procedure Laterality Date  . COLONOSCOPY WITH PROPOFOL N/A 07/07/2016   Procedure: COLONOSCOPY WITH PROPOFOL;  Surgeon: Jerene Bears, MD;  Location: [facility];  Service: Gastroenterology;  Laterality: N/A;  . ESOPHAGOGASTRODUODENOSCOPY (EGD) WITH PROPOFOL N/A 07/07/2016   Procedure: ESOPHAGOGASTRODUODENOSCOPY (EGD) WITH PROPOFOL;  Surgeon: Jerene Bears, MD;  Location: [facility];  Service: Gastroenterology;  Laterality: N/A;  . HYSTEROSCOPY W/D&C N/A 01/16/2015   Procedure: DILATATION AND CURETTAGE /HYSTEROSCOPY;  Surgeon: Donnamae Jude, MD;  Location: [facility];  Service: Gynecology;  Laterality: N/A;  . TOTAL KNEE ARTHROPLASTY  08/2008   Left   Family History  Problem Relation Age of Onset  . Cancer Mother 41       breast  . Cancer Daughter 8       breast  . Cancer Father   . Colon cancer Neg Hx   . Esophageal cancer Neg Hx   . Rectal cancer Neg Hx   . Stomach cancer Neg Hx    Social History   Tobacco Use  . Smoking status: Never Smoker  . Smokeless  tobacco: Never Used  Substance Use Topics  . Alcohol use: No    Alcohol/week: 0.0 standard drinks  . Drug use: No      Review of Systems Per HPI    Objective:   Physical Exam  Constitutional: She is oriented to person, place, and time. She appears well-developed and well-nourished.  HENT:  Head: Normocephalic and atraumatic.  Eyes: Conjunctivae are normal.  Cardiovascular: Normal rate.  Pulmonary/Chest: Effort normal.  Neurological: She is alert and oriented to person, place, and time.  Skin: Skin is warm and dry.  Psychiatric: She has a normal mood and affect. Her behavior is normal. Judgment and thought content normal.  Vitals reviewed.     BP (!) 144/70 (BP Location: Right Arm, Patient Position: Sitting, Cuff Size: Large)   Pulse 78   Temp 97.7 F (36.5 C) (Oral)   Ht 4\' 9"  (1.448 m)   Wt 289 lb 12 oz (131.4 kg)   SpO2 97%   BMI 62.70 kg/m  Wt Readings from Last 3 Encounters:  11/21/18 289 lb 12 oz (131.4 kg)  10/17/18 289 lb (131.1 kg)  08/26/18 289 lb 12.8 oz (131.5 kg)   Depression screen [facility] 2/9 11/21/2018 10/17/2018 08/26/2018 06/08/2016  Decreased Interest 1 3 0 0  Down, Depressed, Hopeless 1 3 0 3  PHQ - 2 Score  2 6 0 3  Altered sleeping 0 3 - 0  Tired, decreased energy 1 3 - 3  Change in appetite 2 3 - 0  Feeling bad or failure about yourself  1 3 - 1  Trouble concentrating 1 0 - 3  Moving slowly or fidgety/restless 0 0 - 1  Suicidal thoughts 0 0 - 0  PHQ-9 Score 7 18 - 11  Difficult doing work/chores - Very difficult - Somewhat difficult       Assessment & Plan:  1. Depression, recurrent ([place]) - significantly improved on duloxetine, PHQ9 7 today, 18 last month - discussed continued treatment with medication for at least 6-12 months - follow up in 3 months to also check labs (TSH, Vit D)   Clarene Reamer, FNP-BC   Primary Care at [facility], [place]  11/21/2018 8:17 AM

## 2018-11-21 NOTE — Patient Instructions (Signed)
Good to see you today  Keep up the good work with exercise  Follow up in 3-4 months

## 2019-01-02 ENCOUNTER — Other Ambulatory Visit: Payer: Self-pay | Admitting: Family Medicine

## 2019-01-02 DIAGNOSIS — F419 Anxiety disorder, unspecified: Secondary | ICD-10-CM

## 2019-01-02 DIAGNOSIS — F329 Major depressive disorder, single episode, unspecified: Secondary | ICD-10-CM

## 2019-01-02 DIAGNOSIS — F32A Depression, unspecified: Secondary | ICD-10-CM

## 2019-01-08 ENCOUNTER — Other Ambulatory Visit: Payer: Self-pay | Admitting: Family Medicine

## 2019-01-08 DIAGNOSIS — M797 Fibromyalgia: Secondary | ICD-10-CM

## 2019-01-08 DIAGNOSIS — M2550 Pain in unspecified joint: Secondary | ICD-10-CM

## 2019-01-09 NOTE — Telephone Encounter (Signed)
Electronic refill request Gabapentin Last office visit 11/21/18 Last refill 10/11/18 #180

## 2019-02-13 ENCOUNTER — Other Ambulatory Visit: Payer: Self-pay | Admitting: Family Medicine

## 2019-02-13 DIAGNOSIS — M2550 Pain in unspecified joint: Secondary | ICD-10-CM

## 2019-02-13 NOTE — Telephone Encounter (Signed)
Electronic refill request. Methocarbamol Last office visit:   11/21/18 Acute Last Filled:    60 tablet 2 10/31/2018  Please advise.

## 2019-02-21 ENCOUNTER — Other Ambulatory Visit: Payer: Self-pay | Admitting: Family Medicine

## 2019-03-24 ENCOUNTER — Other Ambulatory Visit: Payer: Self-pay | Admitting: Family Medicine

## 2019-03-24 DIAGNOSIS — M2550 Pain in unspecified joint: Secondary | ICD-10-CM

## 2019-04-10 ENCOUNTER — Ambulatory Visit: Payer: BLUE CROSS/BLUE SHIELD | Admitting: Family Medicine

## 2019-04-21 ENCOUNTER — Other Ambulatory Visit: Payer: Self-pay

## 2019-04-21 ENCOUNTER — Ambulatory Visit (INDEPENDENT_AMBULATORY_CARE_PROVIDER_SITE_OTHER): Payer: Medicare Other | Admitting: Family Medicine

## 2019-04-21 ENCOUNTER — Encounter: Payer: Self-pay | Admitting: Family Medicine

## 2019-04-21 VITALS — BP 138/70 | HR 88 | Temp 98.4°F | Ht <= 58 in | Wt 266.5 lb

## 2019-04-21 DIAGNOSIS — N904 Leukoplakia of vulva: Secondary | ICD-10-CM

## 2019-04-21 DIAGNOSIS — I10 Essential (primary) hypertension: Secondary | ICD-10-CM | POA: Diagnosis not present

## 2019-04-21 DIAGNOSIS — E559 Vitamin D deficiency, unspecified: Secondary | ICD-10-CM | POA: Diagnosis not present

## 2019-04-21 DIAGNOSIS — N950 Postmenopausal bleeding: Secondary | ICD-10-CM

## 2019-04-21 DIAGNOSIS — E039 Hypothyroidism, unspecified: Secondary | ICD-10-CM | POA: Diagnosis not present

## 2019-04-21 DIAGNOSIS — R7303 Prediabetes: Secondary | ICD-10-CM | POA: Diagnosis not present

## 2019-04-21 LAB — VITAMIN D 25 HYDROXY (VIT D DEFICIENCY, FRACTURES): VITD: 97.94 ng/mL (ref 30.00–100.00)

## 2019-04-21 LAB — BASIC METABOLIC PANEL
BUN: 16 mg/dL (ref 6–23)
CO2: 31 mEq/L (ref 19–32)
Calcium: 9.6 mg/dL (ref 8.4–10.5)
Chloride: 100 mEq/L (ref 96–112)
Creatinine, Ser: 0.98 mg/dL (ref 0.40–1.20)
GFR: 56.95 mL/min — ABNORMAL LOW (ref >60.00)
Glucose, Bld: 137 mg/dL — ABNORMAL HIGH (ref 70–99)
Potassium: 3.8 mEq/L (ref 3.5–5.1)
Sodium: 143 mEq/L (ref 135–145)

## 2019-04-21 LAB — TSH: TSH: 2.46 u[IU]/mL (ref 0.35–4.50)

## 2019-04-21 LAB — HEMOGLOBIN A1C: Hgb A1c MFr Bld: 5.9 % (ref 4.6–6.5)

## 2019-04-21 MED ORDER — CLOBETASOL PROPIONATE 0.05 % EX CREA: 1.0000 "application " | TOPICAL_CREAM | Freq: Two times a day (BID) | CUTANEOUS | 5 refills | Status: DC

## 2019-04-21 NOTE — Patient Instructions (Signed)
Good to see you today  I have put in a referral for you to see gynecology, you will get a call about an appontment  I will notify you of lab results  Keep up the good work with your weight loss!  Follow up in 6 months

## 2019-04-21 NOTE — Progress Notes (Signed)
Subjective:    Patient ID: Joan Russo, female    DOB: October 16, 1956, 63 y.o.   MRN: [id number]  HPI This is a 63 yo female who presents today with vaginal bleeding. Had last month for 3 days and then had last week for 7 days. Was like a regular period, bright red blood, going through 2 pads a day.  Had similar problem several years ago and saw gyn for I&D, no problems again until recently. Not sexually active for >20 years. Last PAP 08/26/2018, negative, negative HPV. Longstanding history of lichen sclerosus of perineum. Has good results with clobetasol but has been out due to insurance coverage, requests refill.   Continues to work from home. Has her teenage granddaughter and one of her friends living with her. Has started dating. Working on diet and has lost some weight.    Past Medical History:  Diagnosis Date  . Anxiety   . Anxiety and depression   . Complication of anesthesia    woke up at beginning of procedure  . Depression   . Diverticulosis   . GERD (gastroesophageal reflux disease)   . Internal hemorrhoids   . OA (osteoarthritis)   . Obesity, Class III, BMI 40-49.9 (morbid obesity) ([place])   . Osteopenia   . Tubular adenoma   . Urinary incontinence   . Vitamin D deficiency    Past Surgical History:  Procedure Laterality Date  . COLONOSCOPY WITH PROPOFOL N/A 07/07/2016   Procedure: COLONOSCOPY WITH PROPOFOL;  Surgeon: Jerene Bears, MD;  Location: [facility];  Service: Gastroenterology;  Laterality: N/A;  . ESOPHAGOGASTRODUODENOSCOPY (EGD) WITH PROPOFOL N/A 07/07/2016   Procedure: ESOPHAGOGASTRODUODENOSCOPY (EGD) WITH PROPOFOL;  Surgeon: Jerene Bears, MD;  Location: [facility];  Service: Gastroenterology;  Laterality: N/A;  . HYSTEROSCOPY W/D&C N/A 01/16/2015   Procedure: DILATATION AND CURETTAGE /HYSTEROSCOPY;  Surgeon: Donnamae Jude, MD;  Location: [facility];  Service: Gynecology;  Laterality: N/A;  . TOTAL KNEE ARTHROPLASTY  08/2008   Left   Family History  Problem  Relation Age of Onset  . Cancer Mother 17       breast  . Cancer Daughter 43       breast  . Cancer Father   . Colon cancer Neg Hx   . Esophageal cancer Neg Hx   . Rectal cancer Neg Hx   . Stomach cancer Neg Hx    Social History   Tobacco Use  . Smoking status: Never Smoker  . Smokeless tobacco: Never Used  Substance Use Topics  . Alcohol use: No    Alcohol/week: 0.0 standard drinks  . Drug use: No      Review of Systems Per HPI    Objective:   Physical Exam Vitals signs reviewed.  Constitutional:      General: She is not in acute distress.    Appearance: Normal appearance. She is obese. She is not ill-appearing, toxic-appearing or diaphoretic.  HENT:     Head: Normocephalic and atraumatic.  Cardiovascular:     Rate and Rhythm: Normal rate.  Pulmonary:     Effort: Pulmonary effort is normal.  Genitourinary:    General: Normal vulva.     Vagina: Bleeding (moderate amount bright red blood. ) present.     Cervix: Cervical bleeding present.  Neurological:     Mental Status: She is alert and oriented to person, place, and time.  Psychiatric:        Mood and Affect: Mood normal.  Behavior: Behavior normal.        Thought Content: Thought content normal.        Judgment: Judgment normal.       BP 138/70   Pulse 88   Temp 98.4 F (36.9 C) (Oral)   Ht 4\' 9"  (1.448 m)   Wt 266 lb 8 oz (120.9 kg)   BMI 57.67 kg/m  Wt Readings from Last 3 Encounters:  04/21/19 266 lb 8 oz (120.9 kg)  11/21/18 289 lb 12 oz (131.4 kg)  10/17/18 289 lb (131.1 kg)       Assessment & Plan:  1. Postmenopausal vaginal bleeding - Ambulatory referral to Gynecology  2. Essential hypertension - well controlled on current meds - Basic Metabolic Panel  3. Vitamin D deficiency - Vitamin D, 25-hydroxy  4. Hypothyroidism, unspecified type - TSH  5. Prediabetes - has had 30+ pound weight loss, encouraged continued efforts  - Hemoglobin A1c  6. Lichen sclerosus of  female genitalia - clobetasol cream (TEMOVATE) 0.05 %; Apply 1 application topically 2 (two) times daily. X 6 wks, then drop to once daily x 6 wks, then 2x/wk  Dispense: 30 g; Refill: 5  - follow up in 6 months  Clarene Reamer, FNP-BC  [place] [facility] at [facility], [place]  04/21/2019 9:06 AM

## 2019-04-25 DIAGNOSIS — M25561 Pain in right knee: Secondary | ICD-10-CM | POA: Diagnosis not present

## 2019-04-25 DIAGNOSIS — M79602 Pain in left arm: Secondary | ICD-10-CM | POA: Diagnosis not present

## 2019-04-28 ENCOUNTER — Encounter: Payer: Self-pay | Admitting: Obstetrics and Gynecology

## 2019-04-28 ENCOUNTER — Other Ambulatory Visit: Payer: Self-pay

## 2019-04-28 ENCOUNTER — Ambulatory Visit (INDEPENDENT_AMBULATORY_CARE_PROVIDER_SITE_OTHER): Payer: Medicare Other

## 2019-04-28 ENCOUNTER — Ambulatory Visit (INDEPENDENT_AMBULATORY_CARE_PROVIDER_SITE_OTHER): Payer: Medicare Other | Admitting: Obstetrics and Gynecology

## 2019-04-28 ENCOUNTER — Other Ambulatory Visit (HOSPITAL_COMMUNITY)
Admission: RE | Admit: 2019-04-28 | Discharge: 2019-04-28 | Disposition: A | Discharge status: Home / Self Care | Payer: Medicare Other | Source: Ambulatory Visit | Attending: Obstetrics and Gynecology | Admitting: Obstetrics and Gynecology

## 2019-04-28 VITALS — BP 120/60 | HR 76 | Ht <= 58 in | Wt 269.0 lb

## 2019-04-28 DIAGNOSIS — N950 Postmenopausal bleeding: Secondary | ICD-10-CM | POA: Diagnosis not present

## 2019-04-28 DIAGNOSIS — N840 Polyp of corpus uteri: Secondary | ICD-10-CM | POA: Diagnosis not present

## 2019-04-28 NOTE — Progress Notes (Signed)
Patient ID: Joan Russo, female   DOB: [birth year], 63 y.o.   MRN: [id number]  Reason for Consult: Consult (Postmenopausal bleeding, alot yesterday and today with clots. No cramping or pain.Third time sense menopause started she has been bleeding )   Referred by Elby Beck, FNP  Subjective:     HPI:  Joan Russo is a 63 y.o. female.  She presents today with complaints of postmenopausal bleeding. She reports about two and a half weeks of persistent daily vaginal bleeding requiring her to wear a menstrual pad which she changes 2-3 times a day. Reports that this is the third time since menopause which she has had such an event.  She was referred here by her PCP.  She denies recent weight changes. She denies pelvic pain or a sense of pelvic fullness. She denies changes to her bowel movements.   Menarche: 11 Menopause: 48 Tubal Ligation at 56  Reports she used OCP for about 10 years prior to her tubal ligation.  Hx of 2 full term SVDs  Denies history of fibroids, ovarian cysts or uterine polyps. Reports that she had a D&C 5-7 years ago at [facility] in [place] for a thickened endometrium related to postmenopausal bleeding.  She is not currently sexually active. She has not had sex in 46 years. However, she does have a boyfirend and she would like to be sexually active in the future.  Her most recent pap smear was 08/26/2018 and was normal Her last mammogram was 2017 [facility] 1.  Last colonoscopy 2017: no polyps, repeat in 5 years.   She has a family history of breast cancer with her mother(61) and daughter(32). She reports that her daughter had extensive genetic testing which was negative. 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 She reports that her father died of a cancer but she does not know what kind, they were not in close contact at the time of his death.   Past Medical History:  Diagnosis Date  . Anxiety   . Anxiety and  depression   . Complication of anesthesia    woke up at beginning of procedure  . Depression   . Diverticulosis   . GERD (gastroesophageal reflux disease)   . Internal hemorrhoids   . OA (osteoarthritis)   . Obesity, Class III, BMI 40-49.9 (morbid obesity) ([place])   . Osteopenia   . Tubular adenoma   . Urinary incontinence   . Vitamin D deficiency    Family History  Problem Relation Age of Onset  . Cancer Mother 7       breast  . Cancer Daughter 66       breast  . Cancer Father   . Colon cancer Neg Hx   . Esophageal cancer Neg Hx   . Rectal cancer Neg Hx   . Stomach cancer Neg Hx    Past Surgical History:  Procedure Laterality Date  . COLONOSCOPY WITH PROPOFOL N/A 07/07/2016   Procedure: COLONOSCOPY WITH PROPOFOL;  Surgeon: Jerene Bears, MD;  Location: [facility];  Service: Gastroenterology;  Laterality: N/A;  . ESOPHAGOGASTRODUODENOSCOPY (EGD) WITH PROPOFOL N/A 07/07/2016   Procedure: ESOPHAGOGASTRODUODENOSCOPY (EGD) WITH PROPOFOL;  Surgeon: Jerene Bears, MD;  Location: [facility];  Service: Gastroenterology;  Laterality: N/A;  . HYSTEROSCOPY W/D&C N/A 01/16/2015   Procedure: DILATATION AND CURETTAGE /HYSTEROSCOPY;  Surgeon: Donnamae Jude, MD;  Location: [facility];  Service: Gynecology;  Laterality: N/A;  . TOTAL KNEE ARTHROPLASTY  08/2008   Left    Short Social History:  Social History   Tobacco Use  . Smoking status: Never Smoker  . Smokeless tobacco: Never Used  Substance Use Topics  . Alcohol use: No    Alcohol/week: 0.0 standard drinks    No Known Allergies  Current Outpatient Medications  Medication Sig Dispense Refill  . buPROPion (WELLBUTRIN SR) 150 MG 12 hr tablet TAKE 1 TABLET BY MOUTH TWICE A DAY 180 tablet 2  . clobetasol cream (TEMOVATE) 5.00 % Apply 1 application topically 2 (two) times daily. X 6 wks, then drop to once daily x 6 wks, then 2x/wk 30 g 5  . DULoxetine (CYMBALTA) 60 MG capsule TAKE 1 CAPSULE BY MOUTH EVERY DAY 90 capsule 1  . gabapentin  (NEURONTIN) 100 MG capsule TAKE 1 CAPSULE BY MOUTH TWICE A DAY 180 capsule 3  . hydrochlorothiazide (MICROZIDE) 12.5 MG capsule TAKE 1 CAPSULE BY MOUTH EVERY DAY 90 capsule 1  . levothyroxine (SYNTHROID, LEVOTHROID) 50 MCG tablet Take 1 tablet (50 mcg total) by mouth daily. 90 tablet 3  . methocarbamol (ROBAXIN) 500 MG tablet TAKE 1 TABLET (500 MG TOTAL) BY MOUTH EVERY 8 (EIGHT) HOURS AS NEEDED FOR MUSCLE SPASMS. 60 tablet 2  . Multiple Vitamin (MULTIVITAMIN WITH MINERALS) TABS tablet Take 1 tablet by mouth daily.    Marland Kitchen ALPRAZolam (XANAX) 0.5 MG tablet Take 1 tablet (0.5 mg total) by mouth at bedtime as needed for anxiety. (Patient not taking: Reported on 04/28/2019) 15 tablet 0  . Cholecalciferol (VITAMIN D3) 50000 units TABS Take 1 tablet by mouth every 7 (seven) days. (Patient not taking: Reported on 04/28/2019) 12 tablet 3  . diclofenac (VOLTAREN) 75 MG EC tablet TAKE 1 TABLET BY MOUTH TWICE A DAY (Patient not taking: Reported on 04/28/2019) 180 tablet 0   No current facility-administered medications for this visit.     Review of Systems  Constitutional: Negative for chills, fatigue, fever and unexpected weight change.  HENT: Negative for trouble swallowing.  Eyes: Negative for loss of vision.  Respiratory: Negative for cough, shortness of breath and wheezing.  Cardiovascular: Negative for chest pain, leg swelling, palpitations and syncope.  GI: Negative for abdominal pain, blood in stool, diarrhea, nausea and vomiting.  GU: Negative for difficulty urinating, dysuria, frequency and hematuria.  Musculoskeletal: Negative for back pain, leg pain and joint pain.  Skin: Negative for rash.  Neurological: Negative for dizziness, headaches, light-headedness, numbness and seizures.  Psychiatric: Negative for behavioral problem, confusion, depressed mood and sleep disturbance.       Objective:  Objective   Vitals:   04/28/19 0815  BP: 120/60  Pulse: 76  Weight: 269 lb (122 kg)  Height: 4' 9.5"  (1.461 m)   Body mass index is 57.2 kg/m.  Physical Exam Vitals signs and nursing note reviewed. Exam conducted with a chaperone present.  Constitutional:      Appearance: She is well-developed.  HENT:     Head: Normocephalic and atraumatic.  Eyes:     Pupils: Pupils are equal, round, and reactive to light.  Cardiovascular:     Rate and Rhythm: Normal rate and regular rhythm.  Pulmonary:     Effort: Pulmonary effort is normal. No respiratory distress.  Abdominal:     General: Abdomen is flat. There is no distension.     Palpations: Abdomen is soft. There is no mass.     Tenderness: There is no abdominal tenderness. There is no guarding or rebound.  Hernia: No hernia is present.  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 Patient has recently started a clobetasol ointment.   Dark red blood 5-10 cc present in the vaginal vault. No active or heavy bleeding.  Cervix flush with vaginal apex.  Uterus normal in size and contour. No fixed pelvis. No abdominal masses. Exam limited by obesity.  Skin:    General: Skin is warm and dry.  Neurological:     Mental Status: She is alert and oriented to person, place, and time.  Psychiatric:        Behavior: Behavior normal.        Thought Content: Thought content normal.        Judgment: Judgment normal.     Endometrial Biopsy After discussion with the patient regarding her abnormal uterine bleeding I recommended that she proceed with an endometrial biopsy for further diagnosis. The risks, benefits, alternatives, and indications for an endometrial biopsy were discussed with the patient in detail. She understood the risks including infection, bleeding, cervical laceration and uterine perforation.  Verbal consent was obtained.   PROCEDURE NOTE:  [place] endometrial biopsy was performed using aseptic technique with iodine preparation.  The uterus was  sounded to a length of 7 cm.  Adequate sampling was obtained with minimal blood loss.  The patient tolerated the procedure well.  Disposition will be pending pathology.      Assessment/Plan:     63 yo with postmenopausal bleeding and thickened endometrium.  Transvaginal US and endometrial biopsy performed today.  Reviewed possible causes with patient such as a uterine polyp, hyperplasia or neoplasm. Plan and disposition for further care will be dependant on pathology result. Will discuss with patient next week once results are received from pathology. Patient has significant obesity which will need to be addressed.  Will need to update mammogram after covid-19 pandemic .  More than 45 minutes were spent face to face with the patient in the room with more than 50% of the time spent providing counseling and discussing the plan of management.     Adrian Prows MD [facility], [facility] 04/28/2019 1:09 PM

## 2019-05-05 ENCOUNTER — Telehealth: Payer: Self-pay | Admitting: Obstetrics and Gynecology

## 2019-05-05 NOTE — Telephone Encounter (Signed)
Called and discussed earlier - complete

## 2019-05-05 NOTE — Telephone Encounter (Signed)
Please advise. Labs look normal if they are I can call her

## 2019-05-05 NOTE — Telephone Encounter (Signed)
Calling for bx results from 04/28/19 with Dr. Gilman Schmidt.

## 2019-05-05 NOTE — Progress Notes (Signed)
Dicussed with patient- continued bleeding, will schedule hysteroscopy D&C

## 2019-05-10 ENCOUNTER — Encounter: Payer: Self-pay | Admitting: Obstetrics and Gynecology

## 2019-05-12 ENCOUNTER — Telehealth: Payer: Self-pay | Admitting: Obstetrics and Gynecology

## 2019-05-12 NOTE — Telephone Encounter (Signed)
Lmtrc

## 2019-05-12 NOTE — Telephone Encounter (Signed)
Patient is aware of H&P at [facility] on 05/29/19 @ 8:10am w/ Dr. Gilman Schmidt, Pre-admit Testing and COVID testing to be scheduled, and OR on 06/06/19. Patient is aware she may be asked to quarantine after COVID testing. Patient is aware she may receive calls from the [facility]. Patient confirmed Medicare. Patient thinks she might have secondary insurance, and is aware if she does she must call back to add. Patient is aware of check-in/ mask/ no visitors for [facility]

## 2019-05-12 NOTE — Telephone Encounter (Signed)
-----   Message from Homero Fellers, MD sent at 05/09/2019  4:35 PM EDT ----- Surgery Booking Request Patient Full Name:  Joan Russo  MRN: [id number]  DOB: October 03, 1956  Surgeon: Homero Fellers, MD  Requested Surgery Date and Time: no preference, sooner is better Primary Diagnosis AND Code: postmenopausal bleeding Secondary Diagnosis and Code:  Surgical Procedure: hysteroscopy, dilation and curettage, possible polypectomy L&D Notification: No Admission Status: same day surgery Length of Surgery: 1 hour Special Case Needs: myosure available H&P: can do day of surgery (date) Phone Interview???: no Interpreter: Language:  Medical Clearance: per anesthesia Special Scheduling Instructions: Discussed with Dr. Wilburt Finlay and he gave okay to schedule

## 2019-05-28 ENCOUNTER — Other Ambulatory Visit: Payer: Self-pay | Admitting: Family Medicine

## 2019-05-28 DIAGNOSIS — M2550 Pain in unspecified joint: Secondary | ICD-10-CM

## 2019-05-29 ENCOUNTER — Ambulatory Visit (INDEPENDENT_AMBULATORY_CARE_PROVIDER_SITE_OTHER): Payer: Medicare Other | Admitting: Obstetrics and Gynecology

## 2019-05-29 ENCOUNTER — Other Ambulatory Visit: Payer: Self-pay

## 2019-05-29 ENCOUNTER — Encounter: Payer: Self-pay | Admitting: Obstetrics and Gynecology

## 2019-05-29 VITALS — BP 126/78 | Ht <= 58 in | Wt 260.0 lb

## 2019-05-29 DIAGNOSIS — Z1231 Encounter for screening mammogram for malignant neoplasm of breast: Secondary | ICD-10-CM | POA: Diagnosis not present

## 2019-05-29 NOTE — H&P (View-Only) (Signed)
Patient ID: Joan Russo, female   DOB: 1956/04/21, 63 y.o.   MRN: [id number]  Reason for Consult: Pre-op Exam   Referred by Elby Beck, FNP  Subjective:     HPI:  Joan Russo is a 63 y.o. female   She presented las month with complaints of postmenopausal bleeding. At that time she reported about two and a half weeks of persistent daily vaginal bleeding requiring her to wear a menstrual pad which she changes 2-3 times a day. Reports that this is the third time since menopause which she has had such an event.  She was referred here by her PCP.  She denies recent weight changes. She denies pelvic pain or a sense of pelvic fullness. She denies changes to her bowel movements.   She underwent an office endometrial biopsy which showed  - ENDOMETRIOID-TYPE POLYP. - BACKGROUND SECRETORY-TYPE ENDOMETRIUM. - THERE IS NO EVIDENCE OF HYPERPLASIA OR MALIGNANCY.  Office Transvaginal US showed: Multiple complex anterior myometrial lesions abutting the endometrium vs endometrial extensions. 2. Thickened endometrium at 10.81mm.  She reports that she had continued vaginal bleeding for about 1 week after her last appointment, but since then she has not continued to have vaginal bleeding.  Menarche: 11 Menopause: 48 Tubal Ligation at 85  Reports she used OCP for about 10 years prior to her tubal ligation.  Hx of 2 full term SVDs  Denies history of fibroids, ovarian cysts or uterine polyps. Reports that she had a D&C 5-7 years ago at [facility] in [place] for a thickened endometrium related to postmenopausal bleeding.  She is not currently sexually active. She has not had sex in 6 years. However, she does have a boyfirend and she would like to be sexually active in the future.  Her most recent pap smear was 08/26/2018 and was normal Her last mammogram was 2017 [facility] 1.  Last colonoscopy 2017: no polyps, repeat in 5 years.   She has a family history of breast cancer  with her mother(61) and daughter(32). She reports that her daughter had extensive genetic testing which was negative. 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 She reports that her father died of a cancer but she does not know what kind, they were not in close contact at the time of his death.    Past Medical History:  Diagnosis Date  . Anxiety   . Anxiety and depression   . Complication of anesthesia    woke up at beginning of procedure  . Depression   . Diverticulosis   . GERD (gastroesophageal reflux disease)   . Internal hemorrhoids   . OA (osteoarthritis)   . Obesity, Class III, BMI 40-49.9 (morbid obesity) ([place])   . Osteopenia   . Tubular adenoma   . Urinary incontinence   . Vitamin D deficiency    Family History  Problem Relation Age of Onset  . Cancer Mother 28       breast  . Cancer Daughter 21       breast  . Cancer Father   . Colon cancer Neg Hx   . Esophageal cancer Neg Hx   . Rectal cancer Neg Hx   . Stomach cancer Neg Hx    Past Surgical History:  Procedure Laterality Date  . COLONOSCOPY WITH PROPOFOL N/A 07/07/2016   Procedure: COLONOSCOPY WITH PROPOFOL;  Surgeon: Jerene Bears, MD;  Location: [facility];  Service: Gastroenterology;  Laterality: N/A;  . ESOPHAGOGASTRODUODENOSCOPY (EGD)  WITH PROPOFOL N/A 07/07/2016   Procedure: ESOPHAGOGASTRODUODENOSCOPY (EGD) WITH PROPOFOL;  Surgeon: Jerene Bears, MD;  Location: [facility];  Service: Gastroenterology;  Laterality: N/A;  . HYSTEROSCOPY W/D&C N/A 01/16/2015   Procedure: DILATATION AND CURETTAGE /HYSTEROSCOPY;  Surgeon: Donnamae Jude, MD;  Location: [facility];  Service: Gynecology;  Laterality: N/A;  . TOTAL KNEE ARTHROPLASTY  08/2008   Left    Short Social History:  Social History   Tobacco Use  . Smoking status: Never Smoker  . Smokeless tobacco: Never Used  Substance Use Topics  . Alcohol use: No    Alcohol/week: 0.0 standard drinks    No Known Allergies  Current  Outpatient Medications  Medication Sig Dispense Refill  . buPROPion (WELLBUTRIN SR) 150 MG 12 hr tablet TAKE 1 TABLET BY MOUTH TWICE A DAY 180 tablet 2  . clobetasol cream (TEMOVATE) 0.27 % Apply 1 application topically 2 (two) times daily. X 6 wks, then drop to once daily x 6 wks, then 2x/wk 30 g 5  . DULoxetine (CYMBALTA) 60 MG capsule TAKE 1 CAPSULE BY MOUTH EVERY DAY 90 capsule 1  . gabapentin (NEURONTIN) 100 MG capsule TAKE 1 CAPSULE BY MOUTH TWICE A DAY 180 capsule 3  . hydrochlorothiazide (MICROZIDE) 12.5 MG capsule TAKE 1 CAPSULE BY MOUTH EVERY DAY 90 capsule 1  . levothyroxine (SYNTHROID, LEVOTHROID) 50 MCG tablet Take 1 tablet (50 mcg total) by mouth daily. 90 tablet 3  . methocarbamol (ROBAXIN) 500 MG tablet TAKE 1 TABLET (500 MG TOTAL) BY MOUTH EVERY 8 (EIGHT) HOURS AS NEEDED FOR MUSCLE SPASMS. 60 tablet 2  . Multiple Vitamin (MULTIVITAMIN WITH MINERALS) TABS tablet Take 1 tablet by mouth daily.    Marland Kitchen ALPRAZolam (XANAX) 0.5 MG tablet Take 1 tablet (0.5 mg total) by mouth at bedtime as needed for anxiety. (Patient not taking: Reported on 04/28/2019) 15 tablet 0  . Cholecalciferol (VITAMIN D3) 50000 units TABS Take 1 tablet by mouth every 7 (seven) days. (Patient not taking: Reported on 04/28/2019) 12 tablet 3  . diclofenac (VOLTAREN) 75 MG EC tablet TAKE 1 TABLET BY MOUTH TWICE A DAY (Patient not taking: Reported on 04/28/2019) 180 tablet 0   No current facility-administered medications for this visit.     Review of Systems  Constitutional: Negative for chills, fatigue, fever and unexpected weight change.  HENT: Negative for trouble swallowing.  Eyes: Negative for loss of vision.  Respiratory: Negative for cough, shortness of breath and wheezing.  Cardiovascular: Negative for chest pain, leg swelling, palpitations and syncope.  GI: Negative for abdominal pain, blood in stool, diarrhea, nausea and vomiting.  GU: Negative for difficulty urinating, dysuria, frequency and hematuria.   Musculoskeletal: Negative for back pain, leg pain and joint pain.  Skin: Negative for rash.  Neurological: Negative for dizziness, headaches, light-headedness, numbness and seizures.  Psychiatric: Negative for behavioral problem, confusion, depressed mood and sleep disturbance.        Objective:  Objective   Vitals:   05/29/19 0808  BP: 126/78  Weight: 260 lb (117.9 kg)  Height: 4\' 10"  (1.473 m)   Body mass index is 54.34 kg/m.  Physical Exam Vitals signs and nursing note reviewed.  Constitutional:      Appearance: She is well-developed.  HENT:     Head: Normocephalic and atraumatic.  Eyes:     Pupils: Pupils are equal, round, and reactive to light.  Cardiovascular:     Rate and Rhythm: Normal rate and regular rhythm.  Pulmonary:  Effort: Pulmonary effort is normal. No respiratory distress.  Skin:    General: Skin is warm and dry.  Neurological:     Mental Status: She is alert and oriented to person, place, and time.  Psychiatric:        Behavior: Behavior normal.        Thought Content: Thought content normal.        Judgment: Judgment normal.    Assessment/Plan:     63 yo with postmenopausal bleeding, thickened endometrium.  Endometrial biopsy showed normal endometrium, however given the thickness and heterogeneous appearance of the endometrium and her comorbid risk factors there is still significant concern for hyperplasia or neoplasm. Will proceed with hysteroscopy, dilation and curettage and possible polypectomy to further evaluate the endometrium and exclude hyperplasia and neoplasm.  Additionally discussed that her mammogram needs to be updated. Order place. Patient given information to schedule imaging.   More than 15 minutes were spent face to face with the patient in the room with more than 50% of the time spent providing counseling and discussing the plan of management.     Adrian Prows MD [facility] 05/29/2019  8:33 AM

## 2019-05-29 NOTE — Progress Notes (Signed)
Patient ID: Joan Russo, female   DOB: 1956-12-14, 63 y.o.   MRN: [id number]  Reason for Consult: Pre-op Exam   Referred by Elby Beck, FNP  Subjective:     HPI:  Joan Russo is a 63 y.o. female   She presented las month with complaints of postmenopausal bleeding. At that time she reported about two and a half weeks of persistent daily vaginal bleeding requiring her to wear a menstrual pad which she changes 2-3 times a day. Reports that this is the third time since menopause which she has had such an event.  She was referred here by her PCP.  She denies recent weight changes. She denies pelvic pain or a sense of pelvic fullness. She denies changes to her bowel movements.   She underwent an office endometrial biopsy which showed  - ENDOMETRIOID-TYPE POLYP. - BACKGROUND SECRETORY-TYPE ENDOMETRIUM. - THERE IS NO EVIDENCE OF HYPERPLASIA OR MALIGNANCY.  Office Transvaginal US showed: Multiple complex anterior myometrial lesions abutting the endometrium vs endometrial extensions. 2. Thickened endometrium at 10.54mm.  She reports that she had continued vaginal bleeding for about 1 week after her last appointment, but since then she has not continued to have vaginal bleeding.  Menarche: 11 Menopause: 48 Tubal Ligation at 34  Reports she used OCP for about 10 years prior to her tubal ligation.  Hx of 2 full term SVDs  Denies history of fibroids, ovarian cysts or uterine polyps. Reports that she had a D&C 5-7 years ago at [facility] in [place] for a thickened endometrium related to postmenopausal bleeding.  She is not currently sexually active. She has not had sex in 84 years. However, she does have a boyfirend and she would like to be sexually active in the future.  Her most recent pap smear was 08/26/2018 and was normal Her last mammogram was 2017 [facility].  Last colonoscopy 2017: no polyps, repeat in 5 years.   She has a family history of breast cancer  with her mother(61) and daughter(32). She reports that her daughter had extensive genetic testing which was negative. 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 She reports that her father died of a cancer but she does not know what kind, they were not in close contact at the time of his death.    Past Medical History:  Diagnosis Date  . Anxiety   . Anxiety and depression   . Complication of anesthesia    woke up at beginning of procedure  . Depression   . Diverticulosis   . GERD (gastroesophageal reflux disease)   . Internal hemorrhoids   . OA (osteoarthritis)   . Obesity, Class III, BMI 40-49.9 (morbid obesity) ([place])   . Osteopenia   . Tubular adenoma   . Urinary incontinence   . Vitamin D deficiency    Family History  Problem Relation Age of Onset  . Cancer Mother 33       breast  . Cancer Daughter 34       breast  . Cancer Father   . Colon cancer Neg Hx   . Esophageal cancer Neg Hx   . Rectal cancer Neg Hx   . Stomach cancer Neg Hx    Past Surgical History:  Procedure Laterality Date  . COLONOSCOPY WITH PROPOFOL N/A 07/07/2016   Procedure: COLONOSCOPY WITH PROPOFOL;  Surgeon: Jerene Bears, MD;  Location: [facility];  Service: Gastroenterology;  Laterality: N/A;  . ESOPHAGOGASTRODUODENOSCOPY (EGD)  WITH PROPOFOL N/A 07/07/2016   Procedure: ESOPHAGOGASTRODUODENOSCOPY (EGD) WITH PROPOFOL;  Surgeon: Jerene Bears, MD;  Location: [facility];  Service: Gastroenterology;  Laterality: N/A;  . HYSTEROSCOPY W/D&C N/A 01/16/2015   Procedure: DILATATION AND CURETTAGE /HYSTEROSCOPY;  Surgeon: Donnamae Jude, MD;  Location: [facility];  Service: Gynecology;  Laterality: N/A;  . TOTAL KNEE ARTHROPLASTY  08/2008   Left    Short Social History:  Social History   Tobacco Use  . Smoking status: Never Smoker  . Smokeless tobacco: Never Used  Substance Use Topics  . Alcohol use: No    Alcohol/week: 0.0 standard drinks    No Known Allergies  Current  Outpatient Medications  Medication Sig Dispense Refill  . buPROPion (WELLBUTRIN SR) 150 MG 12 hr tablet TAKE 1 TABLET BY MOUTH TWICE A DAY 180 tablet 2  . clobetasol cream (TEMOVATE) 1.61 % Apply 1 application topically 2 (two) times daily. X 6 wks, then drop to once daily x 6 wks, then 2x/wk 30 g 5  . DULoxetine (CYMBALTA) 60 MG capsule TAKE 1 CAPSULE BY MOUTH EVERY DAY 90 capsule 1  . gabapentin (NEURONTIN) 100 MG capsule TAKE 1 CAPSULE BY MOUTH TWICE A DAY 180 capsule 3  . hydrochlorothiazide (MICROZIDE) 12.5 MG capsule TAKE 1 CAPSULE BY MOUTH EVERY DAY 90 capsule 1  . levothyroxine (SYNTHROID, LEVOTHROID) 50 MCG tablet Take 1 tablet (50 mcg total) by mouth daily. 90 tablet 3  . methocarbamol (ROBAXIN) 500 MG tablet TAKE 1 TABLET (500 MG TOTAL) BY MOUTH EVERY 8 (EIGHT) HOURS AS NEEDED FOR MUSCLE SPASMS. 60 tablet 2  . Multiple Vitamin (MULTIVITAMIN WITH MINERALS) TABS tablet Take 1 tablet by mouth daily.    Marland Kitchen ALPRAZolam (XANAX) 0.5 MG tablet Take 1 tablet (0.5 mg total) by mouth at bedtime as needed for anxiety. (Patient not taking: Reported on 04/28/2019) 15 tablet 0  . Cholecalciferol (VITAMIN D3) 50000 units TABS Take 1 tablet by mouth every 7 (seven) days. (Patient not taking: Reported on 04/28/2019) 12 tablet 3  . diclofenac (VOLTAREN) 75 MG EC tablet TAKE 1 TABLET BY MOUTH TWICE A DAY (Patient not taking: Reported on 04/28/2019) 180 tablet 0   No current facility-administered medications for this visit.     Review of Systems  Constitutional: Negative for chills, fatigue, fever and unexpected weight change.  HENT: Negative for trouble swallowing.  Eyes: Negative for loss of vision.  Respiratory: Negative for cough, shortness of breath and wheezing.  Cardiovascular: Negative for chest pain, leg swelling, palpitations and syncope.  GI: Negative for abdominal pain, blood in stool, diarrhea, nausea and vomiting.  GU: Negative for difficulty urinating, dysuria, frequency and hematuria.   Musculoskeletal: Negative for back pain, leg pain and joint pain.  Skin: Negative for rash.  Neurological: Negative for dizziness, headaches, light-headedness, numbness and seizures.  Psychiatric: Negative for behavioral problem, confusion, depressed mood and sleep disturbance.        Objective:  Objective   Vitals:   05/29/19 0808  BP: 126/78  Weight: 260 lb (117.9 kg)  Height: 4\' 10"  (1.473 m)   Body mass index is 54.34 kg/m.  Physical Exam Vitals signs and nursing note reviewed.  Constitutional:      Appearance: She is well-developed.  HENT:     Head: Normocephalic and atraumatic.  Eyes:     Pupils: Pupils are equal, round, and reactive to light.  Cardiovascular:     Rate and Rhythm: Normal rate and regular rhythm.  Pulmonary:  Effort: Pulmonary effort is normal. No respiratory distress.  Skin:    General: Skin is warm and dry.  Neurological:     Mental Status: She is alert and oriented to person, place, and time.  Psychiatric:        Behavior: Behavior normal.        Thought Content: Thought content normal.        Judgment: Judgment normal.    Assessment/Plan:     64 yo with postmenopausal bleeding, thickened endometrium.  Endometrial biopsy showed normal endometrium, however given the thickness and heterogeneous appearance of the endometrium and her comorbid risk factors there is still significant concern for hyperplasia or neoplasm. Will proceed with hysteroscopy, dilation and curettage and possible polypectomy to further evaluate the endometrium and exclude hyperplasia and neoplasm.  Additionally discussed that her mammogram needs to be updated. Order place. Patient given information to schedule imaging.   More than 15 minutes were spent face to face with the patient in the room with more than 50% of the time spent providing counseling and discussing the plan of management.     Adrian Prows MD [facility] 05/29/2019  8:33 AM

## 2019-05-29 NOTE — Telephone Encounter (Signed)
Last office visit 04/21/2019 for vaginal bleeding.  Last refilled 02/13/2019 for #60 with 2 refills.  No future appointments.

## 2019-06-01 ENCOUNTER — Other Ambulatory Visit: Payer: Self-pay

## 2019-06-01 ENCOUNTER — Encounter
Admission: RE | Admit: 2019-06-01 | Discharge: 2019-06-01 | Disposition: A | Discharge status: Home / Self Care | Payer: Medicare Other | Source: Ambulatory Visit | Attending: Obstetrics and Gynecology | Admitting: Obstetrics and Gynecology

## 2019-06-01 DIAGNOSIS — E669 Obesity, unspecified: Secondary | ICD-10-CM | POA: Insufficient documentation

## 2019-06-01 DIAGNOSIS — Z1159 Encounter for screening for other viral diseases: Secondary | ICD-10-CM | POA: Diagnosis not present

## 2019-06-01 DIAGNOSIS — Z01810 Encounter for preprocedural cardiovascular examination: Secondary | ICD-10-CM | POA: Diagnosis not present

## 2019-06-01 DIAGNOSIS — Z01818 Encounter for other preprocedural examination: Secondary | ICD-10-CM | POA: Diagnosis not present

## 2019-06-01 LAB — COMPREHENSIVE METABOLIC PANEL
ALT: 32 U/L (ref 0–44)
AST: 27 U/L (ref 15–41)
Albumin: 3.9 g/dL (ref 3.5–5.0)
Alkaline Phosphatase: 75 U/L (ref 38–126)
Anion gap: 13 (ref 5–15)
BUN: 13 mg/dL (ref 8–23)
CO2: 28 mmol/L (ref 22–32)
Calcium: 9.1 mg/dL (ref 8.9–10.3)
Chloride: 100 mmol/L (ref 98–111)
Creatinine, Ser: 0.91 mg/dL (ref 0.44–1.00)
GFR calc Af Amer: >60 mL/min (ref >60)
GFR calc non Af Amer: >60 mL/min (ref >60)
Glucose, Bld: 98 mg/dL (ref 70–99)
Potassium: 3 mmol/L — ABNORMAL LOW (ref 3.5–5.1)
Sodium: 141 mmol/L (ref 135–145)
Total Bilirubin: 0.5 mg/dL (ref 0.3–1.2)
Total Protein: 7 g/dL (ref 6.5–8.1)

## 2019-06-01 LAB — CBC
HCT: 41.9 % (ref 36.0–46.0)
Hemoglobin: 14.1 g/dL (ref 12.0–15.0)
MCH: 30.2 pg (ref 26.0–34.0)
MCHC: 33.7 g/dL (ref 30.0–36.0)
MCV: 89.7 fL (ref 80.0–100.0)
Platelets: 318 10*3/uL (ref 150–400)
RBC: 4.67 MIL/uL (ref 3.87–5.11)
RDW: 14.7 % (ref 11.5–15.5)
WBC: 9.7 10*3/uL (ref 4.0–10.5)
nRBC: 0 % (ref 0.0–0.2)

## 2019-06-01 NOTE — Patient Instructions (Signed)
Your procedure is scheduled on: @ADMITDT2 @ Report to Day Surgery. [facility] SECOND FLOOR To find out your arrival time please call [phone] between 1PM - 3PM on 06/05/19 Remember: Instructions that are not followed completely may result in serious medical risk,  up to and including death, or upon the discretion of your surgeon and anesthesiologist your  surgery may need to be rescheduled.     _X__ 1. Do not eat food after midnight the night before your procedure.                 No gum chewing or hard candies. You may drink clear liquids up to 2 hours                 before you are scheduled to arrive for your surgery- DO not drink clear                 liquids within 2 hours of the start of your surgery.                 Clear Liquids include:  water, apple juice without pulp, clear carbohydrate                 drink such as Clearfast of Gatorade, Black Coffee or Tea (Do not add                 anything to coffee or tea).  __X__2.  On the morning of surgery brush your teeth with toothpaste and water, you                may rinse your mouth with mouthwash if you wish.  Do not swallow any toothpaste of mouthwash.     _X__ 3.  No Alcohol for 24 hours before or after surgery.   _X__ 4.  Do Not Smoke or use e-cigarettes For 24 Hours Prior to Your Surgery.                 Do not use any chewable tobacco products for at least 6 hours prior to                 surgery.  ____  5.  Bring all medications with you on the day of surgery if instructed.   _X___  6.  Notify your doctor if there is any change in your medical condition      (cold, fever, infections).     Do not wear jewelry, make-up, hairpins, clips or nail polish. Do not wear lotions, powders, or perfumes. You may wear deodorant. Do not shave 48 hours prior to surgery. Men may shave face and neck. Do not bring valuables to the hospital.    [facility] is not responsible for any belongings or  valuables.  Contacts, dentures or bridgework may not be worn into surgery. Leave your suitcase in the car. After surgery it may be brought to your room. For patients admitted to the hospital, discharge time is determined by your treatment team.   Patients discharged the day of surgery will not be allowed to drive home.           _X___ Take these medicines the morning of surgery with A SIP OF WATER:    1. BUPROPION  2. DULOXETINE  3. GABAPENTIN  4. LEVOTHYROXINE  5. METHOCARBAMOL  6.  ____ Fleet Enema (as directed)   ____ Use CHG Soap as directed  ____ Use inhalers on the day of surgery  ____  Stop metformin 2 days prior to surgery    ____ Take 1/2 of usual insulin dose the night before surgery. No insulin the morning          of surgery.   ____ Stop Coumadin/Plavix/aspirin on  ____ Stop Anti-inflammatories on  ____ Stop supplements until after surgery.    __X__ Bring C-Pap to the hospital.

## 2019-06-02 ENCOUNTER — Other Ambulatory Visit
Admission: RE | Admit: 2019-06-02 | Discharge: 2019-06-02 | Disposition: A | Discharge status: Home / Self Care | Payer: Medicare Other | Source: Ambulatory Visit | Attending: Obstetrics and Gynecology | Admitting: Obstetrics and Gynecology

## 2019-06-02 DIAGNOSIS — E669 Obesity, unspecified: Secondary | ICD-10-CM | POA: Diagnosis not present

## 2019-06-02 DIAGNOSIS — Z1159 Encounter for screening for other viral diseases: Secondary | ICD-10-CM | POA: Diagnosis not present

## 2019-06-02 DIAGNOSIS — Z01818 Encounter for other preprocedural examination: Secondary | ICD-10-CM | POA: Diagnosis not present

## 2019-06-02 NOTE — Pre-Procedure Instructions (Signed)
EKG REVIEWED BY DR Ola Spurr 06/01/19 AND WITH NO CARDIAC SYMPTOMS OK TO PROCEED

## 2019-06-02 NOTE — Progress Notes (Signed)
WNL, released to [employer]

## 2019-06-03 LAB — NOVEL CORONAVIRUS, NAA (HOSP ORDER, SEND-OUT TO REF LAB; TAT 18-24 HRS): SARS-CoV-2, NAA: NOT DETECTED

## 2019-06-05 LAB — TYPE AND SCREEN
ABO/RH(D): A POS
Antibody Screen: NEGATIVE

## 2019-06-06 ENCOUNTER — Ambulatory Visit: Payer: Medicare Other | Admitting: Anesthesiology

## 2019-06-06 ENCOUNTER — Encounter: Payer: Self-pay | Admitting: Emergency Medicine

## 2019-06-06 ENCOUNTER — Ambulatory Visit
Admission: RE | Admit: 2019-06-06 | Discharge: 2019-06-06 | Disposition: A | Discharge status: Home / Self Care | Payer: Medicare Other | Attending: Obstetrics and Gynecology | Admitting: Obstetrics and Gynecology

## 2019-06-06 ENCOUNTER — Encounter
Admission: RE | Disposition: A | Discharge status: Home / Self Care | Payer: Self-pay | Source: Home / Self Care | Attending: Obstetrics and Gynecology

## 2019-06-06 ENCOUNTER — Other Ambulatory Visit: Payer: Self-pay

## 2019-06-06 DIAGNOSIS — E559 Vitamin D deficiency, unspecified: Secondary | ICD-10-CM | POA: Insufficient documentation

## 2019-06-06 DIAGNOSIS — Z6841 Body Mass Index (BMI) 40.0 and over, adult: Secondary | ICD-10-CM | POA: Insufficient documentation

## 2019-06-06 DIAGNOSIS — Z79890 Hormone replacement therapy (postmenopausal): Secondary | ICD-10-CM | POA: Insufficient documentation

## 2019-06-06 DIAGNOSIS — M8580 Other specified disorders of bone density and structure, unspecified site: Secondary | ICD-10-CM | POA: Diagnosis not present

## 2019-06-06 DIAGNOSIS — K219 Gastro-esophageal reflux disease without esophagitis: Secondary | ICD-10-CM | POA: Diagnosis not present

## 2019-06-06 DIAGNOSIS — R9389 Abnormal findings on diagnostic imaging of other specified body structures: Secondary | ICD-10-CM | POA: Diagnosis not present

## 2019-06-06 DIAGNOSIS — Z96652 Presence of left artificial knee joint: Secondary | ICD-10-CM | POA: Diagnosis not present

## 2019-06-06 DIAGNOSIS — F329 Major depressive disorder, single episode, unspecified: Secondary | ICD-10-CM | POA: Diagnosis not present

## 2019-06-06 DIAGNOSIS — G4730 Sleep apnea, unspecified: Secondary | ICD-10-CM | POA: Diagnosis not present

## 2019-06-06 DIAGNOSIS — G4733 Obstructive sleep apnea (adult) (pediatric): Secondary | ICD-10-CM | POA: Diagnosis not present

## 2019-06-06 DIAGNOSIS — I10 Essential (primary) hypertension: Secondary | ICD-10-CM | POA: Diagnosis not present

## 2019-06-06 DIAGNOSIS — N950 Postmenopausal bleeding: Secondary | ICD-10-CM | POA: Diagnosis not present

## 2019-06-06 DIAGNOSIS — E039 Hypothyroidism, unspecified: Secondary | ICD-10-CM | POA: Insufficient documentation

## 2019-06-06 DIAGNOSIS — F418 Other specified anxiety disorders: Secondary | ICD-10-CM | POA: Diagnosis not present

## 2019-06-06 DIAGNOSIS — F419 Anxiety disorder, unspecified: Secondary | ICD-10-CM | POA: Diagnosis not present

## 2019-06-06 DIAGNOSIS — M1990 Unspecified osteoarthritis, unspecified site: Secondary | ICD-10-CM | POA: Insufficient documentation

## 2019-06-06 DIAGNOSIS — N840 Polyp of corpus uteri: Secondary | ICD-10-CM | POA: Insufficient documentation

## 2019-06-06 DIAGNOSIS — Z79899 Other long term (current) drug therapy: Secondary | ICD-10-CM | POA: Diagnosis not present

## 2019-06-06 HISTORY — PX: HYSTEROSCOPY WITH D & C: SHX1775

## 2019-06-06 LAB — POCT I-STAT 4, (NA,K, GLUC, HGB,HCT)
Glucose, Bld: 86 mg/dL (ref 70–99)
HCT: 40 % (ref 36.0–46.0)
Hemoglobin: 13.6 g/dL (ref 12.0–15.0)
Potassium: 3 mmol/L — ABNORMAL LOW (ref 3.5–5.1)
Sodium: 141 mmol/L (ref 135–145)

## 2019-06-06 LAB — ABO/RH: ABO/RH(D): A POS

## 2019-06-06 SURGERY — DILATATION AND CURETTAGE /HYSTEROSCOPY
Anesthesia: General

## 2019-06-06 MED ORDER — PROPOFOL 10 MG/ML IV BOLUS
INTRAVENOUS | Status: DC | PRN
  Administered 2019-06-06: 140 mg via INTRAVENOUS

## 2019-06-06 MED ORDER — SUCCINYLCHOLINE CHLORIDE 20 MG/ML IJ SOLN
INTRAMUSCULAR | Status: AC
  Filled 2019-06-06: qty 1

## 2019-06-06 MED ORDER — MORPHINE SULFATE (PF) 4 MG/ML IV SOLN: 2.0000 mg | INTRAVENOUS | Status: DC | PRN

## 2019-06-06 MED ORDER — HEPARIN SODIUM (PORCINE) 5000 UNIT/ML IJ SOLN
INTRAMUSCULAR | Status: AC
  Administered 2019-06-06: 5000 [IU] via SUBCUTANEOUS
  Filled 2019-06-06: qty 1

## 2019-06-06 MED ORDER — SUGAMMADEX SODIUM 200 MG/2ML IV SOLN
INTRAVENOUS | Status: DC | PRN
  Administered 2019-06-06: 200 mg via INTRAVENOUS

## 2019-06-06 MED ORDER — ROCURONIUM BROMIDE 50 MG/5ML IV SOLN
INTRAVENOUS | Status: AC
  Filled 2019-06-06: qty 1

## 2019-06-06 MED ORDER — FENTANYL CITRATE (PF) 250 MCG/5ML IJ SOLN
INTRAMUSCULAR | Status: AC
  Filled 2019-06-06: qty 5

## 2019-06-06 MED ORDER — METOPROLOL TARTRATE 5 MG/5ML IV SOLN
INTRAVENOUS | Status: DC | PRN
  Administered 2019-06-06: 3 mg via INTRAVENOUS
  Administered 2019-06-06: 2 mg via INTRAVENOUS

## 2019-06-06 MED ORDER — MIDAZOLAM HCL 2 MG/2ML IJ SOLN
INTRAMUSCULAR | Status: AC
  Filled 2019-06-06: qty 2

## 2019-06-06 MED ORDER — FENTANYL CITRATE (PF) 100 MCG/2ML IJ SOLN
INTRAMUSCULAR | Status: DC | PRN
  Administered 2019-06-06: 50 ug via INTRAVENOUS

## 2019-06-06 MED ORDER — FAMOTIDINE 20 MG PO TABS
ORAL_TABLET | ORAL | Status: AC
  Administered 2019-06-06: 20 mg via ORAL
  Filled 2019-06-06: qty 1

## 2019-06-06 MED ORDER — IBUPROFEN 600 MG PO TABS: 600.0000 mg | ORAL_TABLET | Freq: Four times a day (QID) | ORAL | 3 refills | Status: AC | PRN

## 2019-06-06 MED ORDER — HYDROMORPHONE HCL 1 MG/ML IJ SOLN
INTRAMUSCULAR | Status: AC
  Filled 2019-06-06: qty 1

## 2019-06-06 MED ORDER — FAMOTIDINE 20 MG PO TABS
20.0000 mg | ORAL_TABLET | Freq: Once | ORAL | Status: AC
  Administered 2019-06-06: 20 mg via ORAL

## 2019-06-06 MED ORDER — PROPOFOL 10 MG/ML IV BOLUS
INTRAVENOUS | Status: AC
  Filled 2019-06-06: qty 20

## 2019-06-06 MED ORDER — HYDROMORPHONE HCL 1 MG/ML IJ SOLN
INTRAMUSCULAR | Status: DC | PRN
  Administered 2019-06-06: .4 mg via INTRAVENOUS

## 2019-06-06 MED ORDER — ONDANSETRON HCL 4 MG/2ML IJ SOLN
INTRAMUSCULAR | Status: DC | PRN
  Administered 2019-06-06: 4 mg via INTRAVENOUS

## 2019-06-06 MED ORDER — ONDANSETRON HCL 4 MG/2ML IJ SOLN
INTRAMUSCULAR | Status: AC
  Filled 2019-06-06: qty 2

## 2019-06-06 MED ORDER — ROCURONIUM BROMIDE 100 MG/10ML IV SOLN
INTRAVENOUS | Status: DC | PRN
  Administered 2019-06-06: 10 mg via INTRAVENOUS
  Administered 2019-06-06: 40 mg via INTRAVENOUS

## 2019-06-06 MED ORDER — LACTATED RINGERS IV SOLN: INTRAVENOUS | Status: DC

## 2019-06-06 MED ORDER — LACTATED RINGERS IV SOLN
INTRAVENOUS | Status: DC | PRN
  Administered 2019-06-06: 07:00:00 via INTRAVENOUS

## 2019-06-06 MED ORDER — HEPARIN SODIUM (PORCINE) 5000 UNIT/ML IJ SOLN
5000.0000 [IU] | INTRAMUSCULAR | Status: AC
  Administered 2019-06-06: 5000 [IU] via SUBCUTANEOUS

## 2019-06-06 MED ORDER — LIDOCAINE HCL (CARDIAC) PF 100 MG/5ML IV SOSY
PREFILLED_SYRINGE | INTRAVENOUS | Status: DC | PRN
  Administered 2019-06-06: 100 mg via INTRAVENOUS

## 2019-06-06 MED ORDER — MIDAZOLAM HCL 2 MG/2ML IJ SOLN
INTRAMUSCULAR | Status: DC | PRN
  Administered 2019-06-06: 2 mg via INTRAVENOUS

## 2019-06-06 MED ORDER — LACTATED RINGERS IV SOLN
INTRAVENOUS | Status: DC
  Administered 2019-06-06: 06:00:00 via INTRAVENOUS

## 2019-06-06 SURGICAL SUPPLY — 22 items
BAG COUNTER SPONGE EZ (MISCELLANEOUS) ×2 IMPLANT
BAG SPNG 4X4 CLR HAZ (MISCELLANEOUS) ×1
CATH ROBINSON RED A/P 16FR (CATHETERS) ×2 IMPLANT
DEVICE MYOSURE LITE (MISCELLANEOUS) IMPLANT
DEVICE MYOSURE REACH (MISCELLANEOUS) IMPLANT
ELECT REM PT RETURN 9FT ADLT (ELECTROSURGICAL) ×2
ELECTRODE REM PT RTRN 9FT ADLT (ELECTROSURGICAL) ×1 IMPLANT
GLOVE BIO SURGEON STRL SZ 6.5 (GLOVE) ×2 IMPLANT
GLOVE BIOGEL PI IND STRL 6.5 (GLOVE) ×2 IMPLANT
GLOVE BIOGEL PI INDICATOR 6.5 (GLOVE) ×2
GOWN STRL REUS W/ TWL LRG LVL3 (GOWN DISPOSABLE) ×2 IMPLANT
GOWN STRL REUS W/TWL LRG LVL3 (GOWN DISPOSABLE) ×4
KIT PROCEDURE FLUENT (KITS) ×2 IMPLANT
PACK DNC HYST (MISCELLANEOUS) ×2 IMPLANT
PAD OB MATERNITY 4.3X12.25 (PERSONAL CARE ITEMS) ×2 IMPLANT
PAD PREP 24X41 OB/GYN DISP (PERSONAL CARE ITEMS) ×2 IMPLANT
SEAL ROD LENS SCOPE MYOSURE (ABLATOR) ×2 IMPLANT
SOL .9 NS 3000ML IRR  AL (IV SOLUTION) ×1
SOL .9 NS 3000ML IRR AL (IV SOLUTION) ×1
SOL .9 NS 3000ML IRR UROMATIC (IV SOLUTION) ×1 IMPLANT
STRAP SAFETY 5IN WIDE (MISCELLANEOUS) ×2 IMPLANT
TOWEL OR 17X26 4PK STRL BLUE (TOWEL DISPOSABLE) ×2 IMPLANT

## 2019-06-06 NOTE — Progress Notes (Signed)
Spoke with family member with consent from patient, notified of time line and our process.

## 2019-06-06 NOTE — Anesthesia Procedure Notes (Signed)
Procedure Name: Intubation Date/Time: 06/06/2019 7:43 AM Performed by: Justus Memory, CRNA Pre-anesthesia Checklist: Patient identified, Patient being monitored, Timeout performed, Emergency Drugs available and Suction available Patient Re-evaluated:Patient Re-evaluated prior to induction Oxygen Delivery Method: Circle system utilized Preoxygenation: Pre-oxygenation with 100% oxygen Induction Type: IV induction Ventilation: Mask ventilation without difficulty Laryngoscope Size: Mac, 3 and McGraph Grade View: Grade II Tube type: Oral Tube size: 7.0 mm Number of attempts: 1 Airway Equipment and Method: Stylet and Video-laryngoscopy Placement Confirmation: ETT inserted through vocal cords under direct vision,  positive ETCO2 and breath sounds checked- equal and bilateral Secured at: 21 cm Tube secured with: Tape Dental Injury: Teeth and Oropharynx as per pre-operative assessment  Difficulty Due To: Difficulty was anticipated, Difficult Airway- due to large tongue, Difficult Airway- due to reduced neck mobility, Difficult Airway- due to anterior larynx and Difficult Airway- due to limited oral opening Future Recommendations: Recommend- induction with short-acting agent, and alternative techniques readily available

## 2019-06-06 NOTE — Anesthesia Post-op Follow-up Note (Signed)
Anesthesia QCDR form completed.        

## 2019-06-06 NOTE — Op Note (Signed)
Operative Note  06/06/2019  PRE-OP DIAGNOSIS: Thickened Endometrium and Postmenopausal Bleeding  POST-OP DIAGNOSIS: Thickened Endometrium and Postmenopausal Bleeding   SURGEON: Adrian Prows MD  PROCEDURE: Procedure(s): DILATATION AND CURETTAGE /HYSTEROSCOPY   ANESTHESIA: Choice   ESTIMATED BLOOD LOSS:  10cc  URINE OUTPUT: 100cc  SPECIMENS:  EMC  COMPLICATIONS: none  DISPOSITION: PACU - hemodynamically stable.  CONDITION: stable   FINDINGS: Exam under anesthesia revealed small, mobile 8cm uterus with no masses and bilateral adnexa without masses or fullness.   PROCEDURE IN DETAIL: After informed consent was obtained, the patient was taken to the operating room where anesthesia was obtained without difficulty. The patient was positioned in the dorsal lithotomy position in [place]. The patient's bladder was catheterized with an in and out foley catheter. The patient was examined under anesthesia, with the above noted findings. The weighted speculum was placed inside the patient's vagina, and the the anterior lip of the cervix was seen and grasped with the tenaculum. The uterine cavity was sounded to 8 cm, and then the cervix was progressively dilated to a 15 French-Pratt dilator. The hysteroscope was inserted through the cervical canal and the cavity was distended. The uterine cavity was uniform in appearance. No polyps of fibroids were seen. The hysteroscope was removed. The uterine cavity was curetted until a gritty texture was noted, yielding specimen for endometrial curettings. The hysteroscopy was repeated showing that all uterine walls had been sampled. Excellent hemostasis was noted, and all instruments were removed, with excellent hemostasis noted throughout. She was then taken out of dorsal lithotomy. The patient tolerated the procedure well. Sponge, lap and needle counts were correct x2. The patient was taken to recovery room in excellent condition.  Adrian Prows MD [facility] 06/06/2019 9:02 AM

## 2019-06-06 NOTE — Transfer of Care (Signed)
Immediate Anesthesia Transfer of Care Note  Patient: Joan Russo  Procedure(s) Performed: DILATATION AND CURETTAGE /HYSTEROSCOPY - POSSIBLE POLYPECTOMY (N/A )  Patient Location: PACU  Anesthesia Type:General  Level of Consciousness: sedated  Airway & Oxygen Therapy: Patient Spontanous Breathing and Patient connected to face mask oxygen  Post-op Assessment: Report given to RN and Post -op Vital signs reviewed and stable  Post vital signs: Reviewed and stable  Last Vitals:  Vitals Value Taken Time  BP 124/58 06/06/2019  9:00 AM  Temp 36.8 C 06/06/2019  9:00 AM  Pulse 71 06/06/2019  9:01 AM  Resp 27 06/06/2019  9:01 AM  SpO2 96 % 06/06/2019  9:01 AM  Vitals shown include unvalidated device data.  Last Pain:  Vitals:   06/06/19 0605  TempSrc: Tympanic  PainSc: 0-No pain         Complications: No apparent anesthesia complications

## 2019-06-06 NOTE — Discharge Instructions (Signed)
Dilation and Curettage or Vacuum Curettage, Care After These instructions give you information about caring for yourself after your procedure. Your doctor may also give you more specific instructions. Call your doctor if you have any problems or questions after your procedure. Follow these instructions at home: Activity  Do not drive or use heavy machinery while taking prescription pain medicine.  For 24 hours after your procedure, avoid driving.  Take short walks often, followed by rest periods. Ask your doctor what activities are safe for you. After one or two days, you may be able to return to your normal activities.  Do not lift anything that is heavier than 10 lb (4.5 kg) until your doctor approves.  For at least 2 weeks, or as long as told by your doctor: ? Do not douche. ? Do not use tampons. ? Do not have sex. General instructions   Take over-the-counter and prescription medicines only as told by your doctor. This is very important if you take blood thinning medicine.  Do not take baths, swim, or use a hot tub until your doctor approves. Take showers instead of baths.  Wear compression stockings as told by your doctor.  It is up to you to get the results of your procedure. Ask your doctor when your results will be ready.  Keep all follow-up visits as told by your doctor. This is important. Contact a doctor if:  You have very bad cramps that get worse or do not get better with medicine.  You have very bad pain in your belly (abdomen).  You cannot drink fluids without throwing up (vomiting).  You get pain in a different part of the area between your belly and thighs (pelvis).  You have bad-smelling discharge from your vagina.  You have a rash. Get help right away if:  You are bleeding a lot from your vagina. A lot of bleeding means soaking more than one sanitary pad in an hour, for 2 hours in a row.  You have clumps of blood (blood clots) coming from your  vagina.  You have a fever or chills.  Your belly feels very tender or hard.  You have chest pain.  You have trouble breathing.  You cough up blood.  You feel dizzy.  You feel light-headed.  You pass out (faint).  You have pain in your neck or shoulder area. Summary  Take short walks often, followed by rest periods. Ask your doctor what activities are safe for you. After one or two days, you may be able to return to your normal activities.  Do not lift anything that is heavier than 10 lb (4.5 kg) until your doctor approves.  Do not take baths, swim, or use a hot tub until your doctor approves. Take showers instead of baths.  Contact your doctor if you have any symptoms of infection, like bad-smelling discharge from your vagina. This information is not intended to replace advice given to you by your health care provider. Make sure you discuss any questions you have with your health care provider. Document Released: 09/22/2008 Document Revised: 08/31/2016 Document Reviewed: 08/31/2016 Elsevier Interactive Patient Education  2019 [place]   1) The drugs that you were given will stay in your system until tomorrow so for the next 24 hours you should not:  A) Drive an automobile B) Make any legal decisions C) Drink any alcoholic beverage   2) You may resume regular meals tomorrow.  Today it is better  to start with liquids and gradually work up to solid foods.  You may eat anything you prefer, but it is better to start with liquids, then soup and crackers, and gradually work up to solid foods.   3) Please notify your doctor immediately if you have any unusual bleeding, trouble breathing, redness and pain at the surgery site, drainage, fever, or pain not relieved by medication.    4) Additional Instructions:        Please contact your physician with any problems or Same Day Surgery at [phone], Monday through  Friday 6 am to 4 pm, or [place] at [facility] number at [phone].

## 2019-06-06 NOTE — Interval H&P Note (Signed)
History and Physical Interval Note:  06/06/2019 7:27 AM  Joan Russo  has presented today for surgery, with the diagnosis of POSTMENOPAUSAL BLEEDING.  The various methods of treatment have been discussed with the patient and family. After consideration of risks, benefits and other options for treatment, the patient has consented to  Procedure(s): DILATATION AND CURETTAGE /HYSTEROSCOPY - POSSIBLE POLYPECTOMY (N/A) as a surgical intervention.  The patient's history has been reviewed, patient examined, no change in status, stable for surgery.  I have reviewed the patient's chart and labs.  Questions were answered to the patient's satisfaction.     Robin [place]

## 2019-06-06 NOTE — Anesthesia Postprocedure Evaluation (Signed)
Anesthesia Post Note  Patient: Joan Russo  Procedure(s) Performed: DILATATION AND CURETTAGE /HYSTEROSCOPY (N/A )  Patient location during evaluation: PACU Anesthesia Type: General Level of consciousness: awake and alert Pain management: pain level controlled Vital Signs Assessment: post-procedure vital signs reviewed and stable Respiratory status: spontaneous breathing, nonlabored ventilation and respiratory function stable Cardiovascular status: blood pressure returned to baseline and stable Postop Assessment: no apparent nausea or vomiting Anesthetic complications: no     Last Vitals:  Vitals:   06/06/19 1001 06/06/19 1037  BP: 111/68 100/65  Pulse: 68 62  Resp: 18 18  Temp: 36.7 C   SpO2: 93% 95%    Last Pain:  Vitals:   06/06/19 1037  TempSrc:   PainSc: 0-No pain                 Durenda Hurt

## 2019-06-06 NOTE — Anesthesia Preprocedure Evaluation (Addendum)
Anesthesia Evaluation  Patient identified by MRN, date of birth, ID band Patient awake    Reviewed: Allergy & Precautions, H&P , NPO status , Patient's Chart, lab work & pertinent test results  Airway Mallampati: II       Dental  (+) Chipped   Pulmonary sleep apnea ,           Cardiovascular hypertension,      Neuro/Psych PSYCHIATRIC DISORDERS Anxiety Depression negative neurological ROS     GI/Hepatic Neg liver ROS, hiatal hernia, GERD  Controlled,  Endo/Other  Hypothyroidism Morbid obesity (BMI 54)  Renal/GU      Musculoskeletal   Abdominal   Peds  Hematology negative hematology ROS (+)   Anesthesia Other Findings Past Medical History: No date: Anxiety No date: Anxiety and depression No date: Depression No date: Diverticulosis No date: GERD (gastroesophageal reflux disease) No date: Internal hemorrhoids No date: OA (osteoarthritis) No date: Obesity, Class III, BMI 40-49.9 (morbid obesity) ([facility]) No date: Osteopenia No date: Tubular adenoma No date: Urinary incontinence No date: Vitamin D deficiency  Past Surgical History: 07/07/2016: COLONOSCOPY WITH PROPOFOL; N/A     Comment:  Procedure: COLONOSCOPY WITH PROPOFOL;  Surgeon: Jerene Bears, MD;  Location: [facility];  Service:               Gastroenterology;  Laterality: N/A; 07/07/2016: ESOPHAGOGASTRODUODENOSCOPY (EGD) WITH PROPOFOL; N/A     Comment:  Procedure: ESOPHAGOGASTRODUODENOSCOPY (EGD) WITH               PROPOFOL;  Surgeon: Jerene Bears, MD;  Location: [facility];  Service: Gastroenterology;  Laterality: N/A; 01/16/2015: HYSTEROSCOPY W/D&C; N/A     Comment:  Procedure: DILATATION AND CURETTAGE /HYSTEROSCOPY;                Surgeon: Donnamae Jude, MD;  Location: [facility];  Service:               Gynecology;  Laterality: N/A; No date: JOINT REPLACEMENT 08/2008: TOTAL KNEE ARTHROPLASTY     Comment:  Left  BMI    Body  Mass Index:  54.17 kg/m      Reproductive/Obstetrics negative OB ROS                            Anesthesia Physical Anesthesia Plan  ASA: III  Anesthesia Plan: General ETT   Post-op Pain Management:    Induction:   PONV Risk Score and Plan: Ondansetron, Dexamethasone, Midazolam and Treatment may vary due to age or medical condition  Airway Management Planned:   Additional Equipment:   Intra-op Plan:   Post-operative Plan:   Informed Consent: I have reviewed the patients History and Physical, chart, labs and discussed the procedure including the risks, benefits and alternatives for the proposed anesthesia with the patient or authorized representative who has indicated his/her understanding and acceptance.     Dental Advisory Given  Plan Discussed with: Anesthesiologist and CRNA  Anesthesia Plan Comments:         Anesthesia Quick Evaluation

## 2019-06-07 ENCOUNTER — Encounter: Payer: Self-pay | Admitting: Obstetrics and Gynecology

## 2019-06-07 LAB — SURGICAL PATHOLOGY

## 2019-06-08 NOTE — Progress Notes (Signed)
Normal endometrium, called and discussed on the phone

## 2019-06-12 ENCOUNTER — Ambulatory Visit (INDEPENDENT_AMBULATORY_CARE_PROVIDER_SITE_OTHER): Payer: Medicare Other | Admitting: Obstetrics and Gynecology

## 2019-06-12 ENCOUNTER — Encounter: Payer: Self-pay | Admitting: Obstetrics and Gynecology

## 2019-06-12 ENCOUNTER — Other Ambulatory Visit: Payer: Self-pay

## 2019-06-12 VITALS — BP 130/72 | HR 80 | Ht <= 58 in | Wt 261.0 lb

## 2019-06-12 DIAGNOSIS — N950 Postmenopausal bleeding: Secondary | ICD-10-CM

## 2019-06-12 DIAGNOSIS — Z9889 Other specified postprocedural states: Secondary | ICD-10-CM

## 2019-06-12 NOTE — Progress Notes (Signed)
  Postoperative Follow-up Patient presents post op from hysteroscopy dilation and curettage for postmenopausal bleeding, 1 week ago.  Subjective: Patient reports marked improvement in her preop symptoms. Eating a regular diet without difficulty. The patient is not having any pain.  Activity: normal activities of daily living. Patient reports additional symptom's since surgery of small pink bleeding, getting lighter.  Objective: BP 130/72   Pulse 80   Ht 4\' 10"  (1.473 m)   Wt 261 lb (118.4 kg)   BMI 54.55 kg/m  Physical Exam Constitutional:      Appearance: She is well-developed.  Genitourinary:     Vagina and uterus normal.     No lesions in the vagina.     No cervical motion tenderness.     No right or left adnexal mass present.  HENT:     Head: Normocephalic and atraumatic.  Neck:     Musculoskeletal: Neck supple.     Thyroid: No thyromegaly.  Cardiovascular:     Rate and Rhythm: Normal rate and regular rhythm.     Heart sounds: Normal heart sounds.  Pulmonary:     Effort: Pulmonary effort is normal.     Breath sounds: Normal breath sounds.  Chest:     Breasts:        Right: No inverted nipple, mass, nipple discharge or skin change.        Left: No inverted nipple, mass, nipple discharge or skin change.  Abdominal:     General: Bowel sounds are normal. There is no distension.     Palpations: Abdomen is soft. There is no mass.  Neurological:     Mental Status: She is alert and oriented to person, place, and time.  Skin:    General: Skin is warm and dry.  Psychiatric:        Behavior: Behavior normal.        Thought Content: Thought content normal.        Judgment: Judgment normal.  Vitals signs reviewed.     Assessment: s/p :  hysteroscopy, dilation and curettage stable  Plan: Patient has done well after surgery with no apparent complications.  I have discussed the post-operative course to date, and the expected progress moving forward.  The patient understands  what complications to be concerned about.  I will see the patient in routine follow up, or sooner if needed.    Advised patient again regarding mammogram. Asked her to return if she has another bleeding event, will need further evaluation.  Advised to follow up with PCP regarding EKG findings and optimizing health. Patient working on loosing weight and improving diet.   Activity plan: No restriction..  Pelvic rest.  Christanna R Schuman 06/12/2019, 10:27 AM

## 2019-07-07 ENCOUNTER — Encounter: Payer: Self-pay | Admitting: Family Medicine

## 2019-07-07 ENCOUNTER — Ambulatory Visit (INDEPENDENT_AMBULATORY_CARE_PROVIDER_SITE_OTHER): Payer: Medicare Other | Admitting: Family Medicine

## 2019-07-07 ENCOUNTER — Other Ambulatory Visit: Payer: Self-pay

## 2019-07-07 DIAGNOSIS — M797 Fibromyalgia: Secondary | ICD-10-CM | POA: Diagnosis not present

## 2019-07-07 DIAGNOSIS — R142 Eructation: Secondary | ICD-10-CM

## 2019-07-07 DIAGNOSIS — F419 Anxiety disorder, unspecified: Secondary | ICD-10-CM | POA: Diagnosis not present

## 2019-07-07 DIAGNOSIS — F329 Major depressive disorder, single episode, unspecified: Secondary | ICD-10-CM

## 2019-07-07 DIAGNOSIS — R209 Unspecified disturbances of skin sensation: Secondary | ICD-10-CM | POA: Diagnosis not present

## 2019-07-07 DIAGNOSIS — R9431 Abnormal electrocardiogram [ECG] [EKG]: Secondary | ICD-10-CM

## 2019-07-07 DIAGNOSIS — F32A Depression, unspecified: Secondary | ICD-10-CM

## 2019-07-07 DIAGNOSIS — G8929 Other chronic pain: Secondary | ICD-10-CM | POA: Diagnosis not present

## 2019-07-07 DIAGNOSIS — M25561 Pain in right knee: Secondary | ICD-10-CM | POA: Diagnosis not present

## 2019-07-07 MED ORDER — DULOXETINE HCL 30 MG PO CPEP: 30.0000 mg | ORAL_CAPSULE | Freq: Every day | ORAL | 0 refills | Status: DC

## 2019-07-07 MED ORDER — PREGABALIN 75 MG PO CAPS: 75.0000 mg | ORAL_CAPSULE | Freq: Two times a day (BID) | ORAL | 5 refills | Status: DC

## 2019-07-07 NOTE — Progress Notes (Signed)
Virtual Visit via Video Note  I connected with Joan Russo on 07/07/19 at  8:00 AM EDT by a video enabled telemedicine application and verified that I am speaking with the correct person using two identifiers.  Despite repeated attempts, we were unable to connect via video and had to complete the call via telephone  Location: Patient: In her home Provider: [facility]   I discussed the limitations of evaluation and management by telemedicine and the availability of in person appointments. The patient expressed understanding and agreed to proceed.  History of Present Illness: This is a 63 yo female who requests virtual visit for multiple complaints.   Skin problem- feels like something is crawling on her back x 1 month. Upper mid back.  It does not occur every day.  She is unsure if she has any changes in her skin.  Burping and bad breath-she has noticed that after eating or drinking anything she has a lot of belching and bad breath.  She denies acid reflux, bloating or gas.  She has taken an over-the-counter antacid with some relief.  She has not been able to identify any particular triggers.  She does have a known hiatal hernia.  Fibromyalgia- would like to go back on lyrica from duloxetine.  She is currently on duloxetine and gabapentin but she feels that the Lyrica worked better for her in the past.  She is working on losing weight and has lost about 25 pounds and she is trying to exercise regularly but is somewhat limited due to the gym being closed and pain in her right knee with walking.  Right knee pain-this is been an ongoing problem and mostly bothers her when she tries to walk for exercise.  She has tried ibuprofen and diclofenac oral without significant improvement.  She tried some of her boyfriends diclofenac gel and found this to help.  She has not tried wearing a brace.  Abnormal EKG- she was told that she had had a heart attack after a screening EKG was done prior to recent  D&C.  She denies chest pain, shortness of breath.  She does have some occasional lower extremity swelling.  She had one episode of left shoulder pain within the last year but it did not recur and it resolved spontaneously.   Observations/Objective: The patient is alert and answers questions appropriately.  She is normally conversive without shortness of breath, audible wheeze or witnessed cough.  Her mood and affect are appropriate  There were no vitals taken for this visit. Wt Readings from Last 3 Encounters:  06/12/19 261 lb (118.4 kg)  06/06/19 259 lb 3.2 oz (117.6 kg)  06/01/19 259 lb 3.2 oz (117.6 kg)   BP Readings from Last 3 Encounters:  06/12/19 130/72  06/06/19 100/65  06/01/19 137/76    Assessment and Plan: 1. Skin sensation disturbance - intermittent in nature, ? Related to fibromyalgia - I have asked her to note frequency and any triggers  2. Belching - likely related to air swallowing, ? Hiatal hernia, instructions provided to avoid caffeine, high acid foods, mint products, large amounts food and liquid at one time.   3. Fibromyalgia - will have her wean duloxetine by taking 30 mg daily x 2 weeks, then stopping and stopping gabapentin (currently 100 mg BID). Then will start pregabalin 75 mg BID.   4. Chronic pain of right knee - discussed OTC Voltaren gel, bracing for walking, consider ortho referral/PT if no improvment  5. Abnormal EKG - in  reviewing EKGs, they have looked similar since 2007, therefore I do not believe that there was a recent acute event. Patient without symptoms. Will continue to optimize diabetes management, including lipids.  Patient was instructed to go to ER if she develops any chest pain.   - follow up in 3 months  Clarene Reamer, FNP-BC  [facility]  07/07/2019 8:35 AM   Follow Up Instructions:    I discussed the assessment and treatment plan with the patient. The patient was  provided an opportunity to ask questions and all were answered. The patient agreed with the plan and demonstrated an understanding of the instructions.   The patient was advised to call back or seek an in-person evaluation if the symptoms worsen or if the condition fails to improve as anticipated.   Elby Beck, FNP

## 2019-07-12 ENCOUNTER — Other Ambulatory Visit: Payer: Self-pay | Admitting: Family Medicine

## 2019-07-12 DIAGNOSIS — F419 Anxiety disorder, unspecified: Secondary | ICD-10-CM

## 2019-07-12 DIAGNOSIS — F32A Depression, unspecified: Secondary | ICD-10-CM

## 2019-07-12 DIAGNOSIS — F329 Major depressive disorder, single episode, unspecified: Secondary | ICD-10-CM

## 2019-07-16 ENCOUNTER — Other Ambulatory Visit: Payer: Self-pay | Admitting: Family Medicine

## 2019-07-16 DIAGNOSIS — F329 Major depressive disorder, single episode, unspecified: Secondary | ICD-10-CM

## 2019-07-16 DIAGNOSIS — F32A Depression, unspecified: Secondary | ICD-10-CM

## 2019-07-16 DIAGNOSIS — F419 Anxiety disorder, unspecified: Secondary | ICD-10-CM

## 2019-08-02 ENCOUNTER — Telehealth: Payer: Self-pay

## 2019-08-02 ENCOUNTER — Other Ambulatory Visit: Payer: Self-pay | Admitting: Family Medicine

## 2019-08-02 DIAGNOSIS — F32A Depression, unspecified: Secondary | ICD-10-CM

## 2019-08-02 DIAGNOSIS — L700 Acne vulgaris: Secondary | ICD-10-CM | POA: Diagnosis not present

## 2019-08-02 DIAGNOSIS — F419 Anxiety disorder, unspecified: Secondary | ICD-10-CM

## 2019-08-02 DIAGNOSIS — L578 Other skin changes due to chronic exposure to nonionizing radiation: Secondary | ICD-10-CM | POA: Diagnosis not present

## 2019-08-02 DIAGNOSIS — L821 Other seborrheic keratosis: Secondary | ICD-10-CM | POA: Diagnosis not present

## 2019-08-02 DIAGNOSIS — F329 Major depressive disorder, single episode, unspecified: Secondary | ICD-10-CM

## 2019-08-02 MED ORDER — ALPRAZOLAM 0.5 MG PO TABS: 0.5000 mg | ORAL_TABLET | Freq: Every evening | ORAL | 0 refills | Status: DC | PRN

## 2019-08-02 NOTE — Telephone Encounter (Signed)
Called and spoke with patient. She reports increased stress and anxiety. Situational. Discussed coping mechanisms and provided alprazolam prescription for occasional use. Reviewed dosing instructions for Lyrica (BID) and follow up precautions.

## 2019-08-02 NOTE — Telephone Encounter (Signed)
Pt said she has had some major issues to deal with lately, stress at work and home. Pt has 3 teenagers at home daily;pts bank acct has "gotten messed up". This has been going on for days. Pt requesting refill of xanax. Last refilled # 15 on 01/24/2018. Pt is presently taking bupropion 150 mg bid and pt is taking lyrica 75 mg one daily. Pt has stopped the cymbalta and gabapentin. Pt wants to know if she is supposed to be taking the lyrica once or twice daily? [facility]. Last acute visit 07/07/19. Pt request cb. Pt has missed some work and does not want to schedule in office or virtual appt. Please advise.

## 2019-08-04 ENCOUNTER — Encounter: Payer: Self-pay | Admitting: Family Medicine

## 2019-08-07 ENCOUNTER — Encounter: Payer: Self-pay | Admitting: Family Medicine

## 2019-08-16 ENCOUNTER — Encounter: Payer: Self-pay | Admitting: Family Medicine

## 2019-08-17 ENCOUNTER — Other Ambulatory Visit: Payer: Self-pay | Admitting: Family Medicine

## 2019-08-17 DIAGNOSIS — E559 Vitamin D deficiency, unspecified: Secondary | ICD-10-CM

## 2019-08-17 DIAGNOSIS — F3289 Other specified depressive episodes: Secondary | ICD-10-CM

## 2019-08-17 MED ORDER — SERTRALINE HCL 50 MG PO TABS: 50.0000 mg | ORAL_TABLET | Freq: Every day | ORAL | 3 refills | Status: DC

## 2019-08-17 NOTE — Telephone Encounter (Signed)
Called and spoke with patient in regards to my chart message she sent yesterday.  She was requesting clarification about her medications and we reviewed these.  She reports that she has been feeling increased anxiety has been feeling sad, down, hopeless and like she has nothing to look forward to.  She reports that she has "no reason," to have these feelings.  She feels like no one loves her.  Her teenage daughter and 2 of her daughters friends were living with her most recently but she felt overwhelmed and told her daughter's friend so they had to move out.  This upset her daughter who then decided to go and live with the patient's other daughter out of town.  She is currently taking bupropion 150 mg twice a day and she is on Lyrica 75 mg twice a day.  We did make some medication changes about 6 weeks ago, coming off of her duloxetine and gabapentin and restarting her Lyrica that she had been on previously.  She denies any thoughts of suicide, stating that she " is a [place] and does not want to go to hell."  We discussed counseling and she states that she has had this in the past and does not feel like it would be helpful at this time.  I have sent in sertraline 50 mg for her to start at bedtime.  And will follow-up with her in 2 weeks.  She was advised to follow-up with me sooner if worsening symptoms.  Also, instructions sent to patient via her MyChart.

## 2019-08-21 ENCOUNTER — Other Ambulatory Visit: Payer: Self-pay | Admitting: Family Medicine

## 2019-08-21 DIAGNOSIS — F339 Major depressive disorder, recurrent, unspecified: Secondary | ICD-10-CM

## 2019-08-23 ENCOUNTER — Other Ambulatory Visit: Payer: Self-pay | Admitting: Family Medicine

## 2019-08-23 DIAGNOSIS — E039 Hypothyroidism, unspecified: Secondary | ICD-10-CM

## 2019-08-23 NOTE — Telephone Encounter (Signed)
Last refilled 11/08/2018 #180 x 2 rf Last OV 06/2019 for acute visit, 10/2018 for Depression  Please advise.

## 2019-08-30 DIAGNOSIS — Z23 Encounter for immunization: Secondary | ICD-10-CM | POA: Diagnosis not present

## 2019-09-08 ENCOUNTER — Other Ambulatory Visit: Payer: Self-pay | Admitting: Family Medicine

## 2019-09-08 DIAGNOSIS — F3289 Other specified depressive episodes: Secondary | ICD-10-CM

## 2019-10-09 ENCOUNTER — Other Ambulatory Visit: Payer: Self-pay | Admitting: Family Medicine

## 2019-10-09 DIAGNOSIS — F3289 Other specified depressive episodes: Secondary | ICD-10-CM

## 2019-10-11 NOTE — Telephone Encounter (Signed)
Last seen for depression/anxiety - 11/21/2018 Last refilled 08/17/2019 #30 x 3 refills.  Pharmacy is requesting a 90 day supply rather than 30 with refills.   Please advise Jackelyn Poling, thanks.

## 2019-11-26 ENCOUNTER — Other Ambulatory Visit: Payer: Self-pay | Admitting: Family Medicine

## 2019-11-26 DIAGNOSIS — E039 Hypothyroidism, unspecified: Secondary | ICD-10-CM

## 2019-11-29 ENCOUNTER — Emergency Department
Admission: EM | Admit: 2019-11-29 | Discharge: 2019-11-29 | Disposition: A | Discharge status: Home / Self Care | Payer: Medicare Other | Attending: Emergency Medicine | Admitting: Emergency Medicine

## 2019-11-29 ENCOUNTER — Encounter: Payer: Self-pay | Admitting: Emergency Medicine

## 2019-11-29 ENCOUNTER — Other Ambulatory Visit: Payer: Self-pay

## 2019-11-29 ENCOUNTER — Emergency Department: Payer: Medicare Other

## 2019-11-29 DIAGNOSIS — R0989 Other specified symptoms and signs involving the circulatory and respiratory systems: Secondary | ICD-10-CM | POA: Diagnosis not present

## 2019-11-29 DIAGNOSIS — R55 Syncope and collapse: Secondary | ICD-10-CM | POA: Insufficient documentation

## 2019-11-29 DIAGNOSIS — M5030 Other cervical disc degeneration, unspecified cervical region: Secondary | ICD-10-CM

## 2019-11-29 DIAGNOSIS — Z96652 Presence of left artificial knee joint: Secondary | ICD-10-CM | POA: Diagnosis not present

## 2019-11-29 DIAGNOSIS — R202 Paresthesia of skin: Secondary | ICD-10-CM | POA: Diagnosis not present

## 2019-11-29 DIAGNOSIS — M79602 Pain in left arm: Secondary | ICD-10-CM | POA: Diagnosis present

## 2019-11-29 LAB — CBC
HCT: 42.2 % (ref 36.0–46.0)
Hemoglobin: 14.1 g/dL (ref 12.0–15.0)
MCH: 29 pg (ref 26.0–34.0)
MCHC: 33.4 g/dL (ref 30.0–36.0)
MCV: 86.8 fL (ref 80.0–100.0)
Platelets: 272 10*3/uL (ref 150–400)
RBC: 4.86 MIL/uL (ref 3.87–5.11)
RDW: 14.9 % (ref 11.5–15.5)
WBC: 9.7 10*3/uL (ref 4.0–10.5)
nRBC: 0 % (ref 0.0–0.2)

## 2019-11-29 LAB — BASIC METABOLIC PANEL
Anion gap: 8 (ref 5–15)
BUN: 20 mg/dL (ref 8–23)
CO2: 27 mmol/L (ref 22–32)
Calcium: 9.3 mg/dL (ref 8.9–10.3)
Chloride: 107 mmol/L (ref 98–111)
Creatinine, Ser: 1.05 mg/dL — ABNORMAL HIGH (ref 0.44–1.00)
GFR calc Af Amer: >60 mL/min (ref >60)
GFR calc non Af Amer: 56 mL/min — ABNORMAL LOW (ref >60)
Glucose, Bld: 98 mg/dL (ref 70–99)
Potassium: 4.2 mmol/L (ref 3.5–5.1)
Sodium: 142 mmol/L (ref 135–145)

## 2019-11-29 LAB — TROPONIN I (HIGH SENSITIVITY): Troponin I (High Sensitivity): 3 ng/L (ref <18)

## 2019-11-29 MED ORDER — SODIUM CHLORIDE 0.9% FLUSH: 3.0000 mL | Freq: Once | INTRAVENOUS | Status: DC

## 2019-11-29 MED ORDER — PREDNISONE 10 MG (21) PO TBPK: ORAL_TABLET | ORAL | 0 refills | Status: DC

## 2019-11-29 NOTE — ED Provider Notes (Signed)
[facility] Emergency Department Provider Note       Time seen: ----------------------------------------- 12:21 PM on 11/29/2019 -----------------------------------------   I have reviewed the triage vital signs and the nursing notes.  HISTORY   Chief Complaint Shoulder Pain    HPI Joan Russo is a 63 y.o. female with a history of anxiety, depression, GERD, obesity, fibromyalgia who presents to the ED for left arm pain and paresthesias.  Left arm pain occurred this morning, sometimes she wakes up and the paresthesias are in both arms.  Yesterday she had near syncopal event.  Sometimes she feels like she is going to fall forward when she is standing.  Discomfort is 7 out of 10 in the arm.  Past Medical History:  Diagnosis Date  . Anxiety   . Anxiety and depression   . Depression   . Diverticulosis   . GERD (gastroesophageal reflux disease)   . Internal hemorrhoids   . OA (osteoarthritis)   . Obesity, Class III, BMI 40-49.9 (morbid obesity) ([place])   . Osteopenia   . Tubular adenoma   . Urinary incontinence   . Vitamin D deficiency     Patient Active Problem List   Diagnosis Date Noted  . Neuropathic pain 12/15/2016  . Hiatal hernia   . Esophageal web   . History of colonic polyps   . Fibromyalgia 12/06/2015  . OSA (obstructive sleep apnea) 02/19/2015  . Elevated C-reactive protein (CRP) 01/31/2015  . Elevated sed rate 01/31/2015  . HTN (hypertension) 01/31/2015  . Subclinical hypothyroidism 01/08/2015  . Osteopenia 12/24/2014  . Lichen sclerosus of female genitalia 12/24/2014  . Polyarthralgia 12/01/2011  . Morbid obesity with BMI of 60.0-69.9, adult ([place])   . Anxiety and depression   . Vitamin D deficiency     Past Surgical History:  Procedure Laterality Date  . COLONOSCOPY WITH PROPOFOL N/A 07/07/2016   Procedure: COLONOSCOPY WITH PROPOFOL;  Surgeon: Jerene Bears, MD;  Location: [facility];  Service: Gastroenterology;   Laterality: N/A;  . ESOPHAGOGASTRODUODENOSCOPY (EGD) WITH PROPOFOL N/A 07/07/2016   Procedure: ESOPHAGOGASTRODUODENOSCOPY (EGD) WITH PROPOFOL;  Surgeon: Jerene Bears, MD;  Location: [facility];  Service: Gastroenterology;  Laterality: N/A;  . HYSTEROSCOPY W/D&C N/A 01/16/2015   Procedure: DILATATION AND CURETTAGE /HYSTEROSCOPY;  Surgeon: Donnamae Jude, MD;  Location: [facility];  Service: Gynecology;  Laterality: N/A;  . HYSTEROSCOPY W/D&C N/A 06/06/2019   Procedure: DILATATION AND CURETTAGE /HYSTEROSCOPY;  Surgeon: Homero Fellers, MD;  Location: [facility];  Service: Gynecology;  Laterality: N/A;  . JOINT REPLACEMENT    . TOTAL KNEE ARTHROPLASTY  08/2008   Left    Allergies Patient has no known allergies.  Social History Social History   Tobacco Use  . Smoking status: Never Smoker  . Smokeless tobacco: Never Used  Substance Use Topics  . Alcohol use: No    Alcohol/week: 0.0 standard drinks  . Drug use: No   Review of Systems Constitutional: Negative for fever. Cardiovascular: Negative for chest pain. Respiratory: Negative for shortness of breath. Gastrointestinal: Negative for abdominal pain, vomiting and diarrhea. Musculoskeletal: Positive for left arm pain Skin: Negative for rash. Neurological: Negative for headaches, positive for paresthesias  All systems negative/normal/unremarkable except as stated in the HPI  ____________________________________________   PHYSICAL EXAM:  VITAL SIGNS: ED Triage Vitals  Enc Vitals Group     BP 11/29/19 0844 (!) 139/52     Pulse Rate 11/29/19 0844 68     Resp 11/29/19 0844 20  Temp 11/29/19 0844 98.4 F (36.9 C)     Temp Source 11/29/19 0844 Oral     SpO2 11/29/19 0844 98 %     Weight 11/29/19 0844 263 lb (119.3 kg)     Height 11/29/19 0844 4\' 10"  (1.473 m)     Head Circumference --      Peak Flow --      Pain Score 11/29/19 0857 7     Pain Loc --      Pain Edu? --      Excl. in [place]? --    Constitutional: Alert and  oriented.  Morbidly obese, no distress Eyes: Conjunctivae are normal. Normal extraocular movements. ENT      Head: Normocephalic and atraumatic.      Nose: No congestion/rhinnorhea.      Mouth/Throat: Mucous membranes are moist.      Neck: No stridor. Cardiovascular: Normal rate, regular rhythm. No murmurs, rubs, or gallops. Respiratory: Normal respiratory effort without tachypnea nor retractions. Breath sounds are clear and equal bilaterally. No wheezes/rales/rhonchi. Gastrointestinal: Soft and nontender. Normal bowel sounds Musculoskeletal: Nontender with normal range of motion in extremities. No lower extremity tenderness nor edema. Neurologic:  Normal speech and language. No gross focal neurologic deficits are appreciated.  Paresthesias appreciated in the left arm Skin:  Skin is warm, dry and intact. No rash noted. Psychiatric: Mood and affect are normal. Speech and behavior are normal.  ____________________________________________  EKG: Interpreted by me.  Sinus rhythm with rate of 67 bpm, low voltage, normal axis, normal QT  ____________________________________________  ED COURSE:  As part of my medical decision making, I reviewed the following data within the [place] History obtained from family if available, nursing notes, old chart and ekg, as well as notes from prior ED visits. Patient presented for left arm pain and paresthesias, we will assess with labs and imaging as indicated at this time.   Procedures  [name] was evaluated in Emergency Department on 11/29/2019 for the symptoms described in the history of present illness. She was evaluated in the context of the global COVID-19 pandemic, which necessitated consideration that the patient might be at risk for infection with the SARS-CoV-2 virus that causes COVID-19. Institutional protocols and algorithms that pertain to the evaluation of patients at risk for COVID-19 are in a state of rapid change  based on information released by regulatory bodies including the CDC and federal and state organizations. These policies and algorithms were followed during the patient's care in the ED.  ____________________________________________   LABS (pertinent positives/negatives)  Labs Reviewed  BASIC METABOLIC PANEL - Abnormal; Notable for the following components:      Result Value   Creatinine, Ser 1.05 (*)    GFR calc non Af Amer 56 (*)    All other components within normal limits  CBC  TROPONIN I (HIGH SENSITIVITY)  TROPONIN I (HIGH SENSITIVITY)    RADIOLOGY Images were viewed by me  CT head, CT C-spine HEAD CT   1. No acute intracranial abnormalities.  2. Mild atrophy and minor chronic microvascular ischemic change,  stable from the prior head CT.   CERVICAL CT   1. No fracture or acute finding.  2. Degenerative changes as described. No evidence of a disc  herniation and no significant neural foraminal narrowing.  Chest x-ray IMPRESSION: Pulmonary vascular congestion.  Otherwise negative. ____________________________________________   DIFFERENTIAL DIAGNOSIS   Cervical radiculopathy, paresthesia, peripheral neuropathy, MI, CVA  FINAL ASSESSMENT AND PLAN  Paresthesias,  degenerative disc disease   Plan: The patient had presented for left arm pain and paresthesias. Patient's labs are reassuring. Patient's imaging revealed significant degenerative disc disease but no other acute process.  CT imaging of the head was normal.  We will place her on some steroids to see if this helps her radicular symptoms and refer her to neurosurgery for outpatient follow-up.  Left arm pain was not indicative of ACS.   Laurence Aly, MD    Note: This note was generated in part or whole with voice recognition software. Voice recognition is usually quite accurate but there are transcription errors that can and very often do occur. I apologize for any typographical errors that were not  detected and corrected.     Earleen Newport, MD 11/29/19 [phone]

## 2019-11-29 NOTE — ED Notes (Signed)
D/C form printed, pt signed and form sent to HIM to be scanned into chart

## 2019-11-29 NOTE — ED Notes (Signed)
Patient transported to CT 

## 2019-11-29 NOTE — ED Notes (Signed)
Pt verbalizes understanding of d/c instructions, medications and follow up 

## 2019-11-29 NOTE — ED Triage Notes (Signed)
Left arm pain this AM, occurs when sleeping and normally it has been in both arms.  " it is the whole arm" , yesterday near syncope ,

## 2019-12-19 ENCOUNTER — Other Ambulatory Visit: Payer: Self-pay | Admitting: Family Medicine

## 2019-12-19 DIAGNOSIS — Z1231 Encounter for screening mammogram for malignant neoplasm of breast: Secondary | ICD-10-CM

## 2019-12-28 DIAGNOSIS — Z1231 Encounter for screening mammogram for malignant neoplasm of breast: Secondary | ICD-10-CM

## 2020-01-07 ENCOUNTER — Other Ambulatory Visit: Payer: Self-pay | Admitting: Family Medicine

## 2020-01-07 DIAGNOSIS — M797 Fibromyalgia: Secondary | ICD-10-CM

## 2020-01-08 NOTE — Telephone Encounter (Signed)
[facility] left a [facility]ic[facility] stating that they submitted this refill yesterday. Vicente Males stated that patient asked her to call today to get it refilled because she is completely out. Last refill 07/07/19 #60/5 Last office visit 07/07/19

## 2020-01-29 DIAGNOSIS — Z961 Presence of intraocular lens: Secondary | ICD-10-CM | POA: Diagnosis not present

## 2020-01-29 DIAGNOSIS — H52203 Unspecified astigmatism, bilateral: Secondary | ICD-10-CM | POA: Diagnosis not present

## 2020-01-29 DIAGNOSIS — H04123 Dry eye syndrome of bilateral lacrimal glands: Secondary | ICD-10-CM | POA: Diagnosis not present

## 2020-01-29 DIAGNOSIS — H18602 Keratoconus, unspecified, left eye: Secondary | ICD-10-CM | POA: Diagnosis not present

## 2020-02-01 ENCOUNTER — Other Ambulatory Visit: Payer: Self-pay

## 2020-02-01 ENCOUNTER — Ambulatory Visit
Admission: RE | Admit: 2020-02-01 | Discharge: 2020-02-01 | Disposition: A | Discharge status: Home / Self Care | Payer: Medicare Other | Source: Ambulatory Visit | Attending: Family Medicine | Admitting: Family Medicine

## 2020-02-01 DIAGNOSIS — Z1231 Encounter for screening mammogram for malignant neoplasm of breast: Secondary | ICD-10-CM

## 2020-02-07 ENCOUNTER — Other Ambulatory Visit: Payer: Self-pay | Admitting: Family Medicine

## 2020-02-07 DIAGNOSIS — R928 Other abnormal and inconclusive findings on diagnostic imaging of breast: Secondary | ICD-10-CM

## 2020-02-07 DIAGNOSIS — Z20828 Contact with and (suspected) exposure to other viral communicable diseases: Secondary | ICD-10-CM | POA: Diagnosis not present

## 2020-02-19 ENCOUNTER — Other Ambulatory Visit: Payer: Self-pay | Admitting: Family Medicine

## 2020-02-19 DIAGNOSIS — E039 Hypothyroidism, unspecified: Secondary | ICD-10-CM

## 2020-02-20 ENCOUNTER — Other Ambulatory Visit: Payer: Self-pay | Admitting: Family Medicine

## 2020-02-20 DIAGNOSIS — Z20828 Contact with and (suspected) exposure to other viral communicable diseases: Secondary | ICD-10-CM | POA: Diagnosis not present

## 2020-02-20 DIAGNOSIS — F339 Major depressive disorder, recurrent, unspecified: Secondary | ICD-10-CM

## 2020-02-21 ENCOUNTER — Encounter (HOSPITAL_COMMUNITY): Payer: Self-pay | Admitting: Emergency Medicine

## 2020-02-21 ENCOUNTER — Emergency Department (HOSPITAL_COMMUNITY)
Admission: EM | Admit: 2020-02-21 | Discharge: 2020-02-21 | Disposition: A | Discharge status: Home / Self Care | Payer: Medicare Other | Attending: Emergency Medicine | Admitting: Emergency Medicine

## 2020-02-21 ENCOUNTER — Other Ambulatory Visit: Payer: Self-pay

## 2020-02-21 ENCOUNTER — Emergency Department (HOSPITAL_COMMUNITY): Payer: Medicare Other

## 2020-02-21 DIAGNOSIS — R05 Cough: Secondary | ICD-10-CM | POA: Diagnosis not present

## 2020-02-21 DIAGNOSIS — U071 COVID-19: Secondary | ICD-10-CM | POA: Diagnosis not present

## 2020-02-21 DIAGNOSIS — M791 Myalgia, unspecified site: Secondary | ICD-10-CM | POA: Diagnosis present

## 2020-02-21 DIAGNOSIS — Z79899 Other long term (current) drug therapy: Secondary | ICD-10-CM | POA: Diagnosis not present

## 2020-02-21 DIAGNOSIS — R509 Fever, unspecified: Secondary | ICD-10-CM | POA: Diagnosis not present

## 2020-02-21 DIAGNOSIS — I10 Essential (primary) hypertension: Secondary | ICD-10-CM | POA: Diagnosis not present

## 2020-02-21 DIAGNOSIS — Z96652 Presence of left artificial knee joint: Secondary | ICD-10-CM | POA: Insufficient documentation

## 2020-02-21 LAB — CBC WITH DIFFERENTIAL/PLATELET
Abs Immature Granulocytes: 0.03 10*3/uL (ref 0.00–0.07)
Basophils Absolute: 0 10*3/uL (ref 0.0–0.1)
Basophils Relative: 0 %
Eosinophils Absolute: 0.2 10*3/uL (ref 0.0–0.5)
Eosinophils Relative: 3 %
HCT: 42.8 % (ref 36.0–46.0)
Hemoglobin: 14.4 g/dL (ref 12.0–15.0)
Immature Granulocytes: 0 %
Lymphocytes Relative: 48 %
Lymphs Abs: 3.4 10*3/uL (ref 0.7–4.0)
MCH: 29.6 pg (ref 26.0–34.0)
MCHC: 33.6 g/dL (ref 30.0–36.0)
MCV: 88.1 fL (ref 80.0–100.0)
Monocytes Absolute: 0.4 10*3/uL (ref 0.1–1.0)
Monocytes Relative: 6 %
Neutro Abs: 3 10*3/uL (ref 1.7–7.7)
Neutrophils Relative %: 43 %
Platelets: 262 10*3/uL (ref 150–400)
RBC: 4.86 MIL/uL (ref 3.87–5.11)
RDW: 14.7 % (ref 11.5–15.5)
WBC: 7.1 10*3/uL (ref 4.0–10.5)
nRBC: 0 % (ref 0.0–0.2)

## 2020-02-21 LAB — RESPIRATORY PANEL BY RT PCR (FLU A&B, COVID)
Influenza A by PCR: NEGATIVE
Influenza B by PCR: NEGATIVE
SARS Coronavirus 2 by RT PCR: POSITIVE — AB

## 2020-02-21 LAB — COMPREHENSIVE METABOLIC PANEL
ALT: 24 U/L (ref 0–44)
AST: 30 U/L (ref 15–41)
Albumin: 3.5 g/dL (ref 3.5–5.0)
Alkaline Phosphatase: 76 U/L (ref 38–126)
Anion gap: 16 — ABNORMAL HIGH (ref 5–15)
BUN: 14 mg/dL (ref 8–23)
CO2: 27 mmol/L (ref 22–32)
Calcium: 9.5 mg/dL (ref 8.9–10.3)
Chloride: 99 mmol/L (ref 98–111)
Creatinine, Ser: 0.99 mg/dL (ref 0.44–1.00)
GFR calc Af Amer: >60 mL/min (ref >60)
GFR calc non Af Amer: 60 mL/min — ABNORMAL LOW (ref >60)
Glucose, Bld: 107 mg/dL — ABNORMAL HIGH (ref 70–99)
Potassium: 2.8 mmol/L — ABNORMAL LOW (ref 3.5–5.1)
Sodium: 142 mmol/L (ref 135–145)
Total Bilirubin: 0.8 mg/dL (ref 0.3–1.2)
Total Protein: 7 g/dL (ref 6.5–8.1)

## 2020-02-21 MED ORDER — EPINEPHRINE 0.3 MG/0.3ML IJ SOAJ: 0.3000 mg | Freq: Once | INTRAMUSCULAR | Status: DC | PRN

## 2020-02-21 MED ORDER — DIPHENHYDRAMINE HCL 50 MG/ML IJ SOLN: 50.0000 mg | Freq: Once | INTRAMUSCULAR | Status: DC | PRN

## 2020-02-21 MED ORDER — ALBUTEROL SULFATE HFA 108 (90 BASE) MCG/ACT IN AERS: 2.0000 | INHALATION_SPRAY | RESPIRATORY_TRACT | 0 refills | Status: DC | PRN

## 2020-02-21 MED ORDER — POTASSIUM CHLORIDE 10 MEQ/100ML IV SOLN
10.0000 meq | Freq: Once | INTRAVENOUS | Status: AC
  Administered 2020-02-21: 10 meq via INTRAVENOUS
  Filled 2020-02-21: qty 100

## 2020-02-21 MED ORDER — GUAIFENESIN ER 1200 MG PO TB12: 1.0000 | ORAL_TABLET | Freq: Two times a day (BID) | ORAL | 0 refills | Status: DC

## 2020-02-21 MED ORDER — FAMOTIDINE IN NACL 20-0.9 MG/50ML-% IV SOLN: 20.0000 mg | Freq: Once | INTRAVENOUS | Status: DC | PRN

## 2020-02-21 MED ORDER — SODIUM CHLORIDE 0.9 % IV SOLN
700.0000 mg | Freq: Once | INTRAVENOUS | Status: AC
  Administered 2020-02-21: 700 mg via INTRAVENOUS
  Filled 2020-02-21: qty 20

## 2020-02-21 MED ORDER — SODIUM CHLORIDE 0.9 % IV SOLN: INTRAVENOUS | Status: DC | PRN

## 2020-02-21 MED ORDER — SODIUM CHLORIDE 0.9 % IV BOLUS
500.0000 mL | Freq: Once | INTRAVENOUS | Status: AC
  Administered 2020-02-21: 500 mL via INTRAVENOUS

## 2020-02-21 MED ORDER — ALBUTEROL SULFATE HFA 108 (90 BASE) MCG/ACT IN AERS: 2.0000 | INHALATION_SPRAY | Freq: Once | RESPIRATORY_TRACT | Status: DC | PRN

## 2020-02-21 MED ORDER — METHYLPREDNISOLONE SODIUM SUCC 125 MG IJ SOLR: 125.0000 mg | Freq: Once | INTRAMUSCULAR | Status: DC | PRN

## 2020-02-21 NOTE — ED Provider Notes (Signed)
[facility] EMERGENCY DEPARTMENT Provider Note   CSN: [id number] Arrival date & time: 02/21/20  0932     History No chief complaint on file.   Joan Russo is a 64 y.o. female.  HPI Patient presents to the emergency department with possible Covid infection.  The patient states that she was tested after an exposure to someone that was positive but that was negative.  Patient states that she has been having body aches fever cough fatigue with some vomiting.  The patient states she has had decreased appetite as well.  The patient denies chest pain, shortness of breath, ,blurred vision, neck pain,weakness, numbness, dizziness, anorexia, edema, abdominal pain, nausea, vomiting, diarrhea, rash, back pain, dysuria, hematemesis, bloody stool, near syncope, or syncope.    Past Medical History:  Diagnosis Date  . Anxiety   . Anxiety and depression   . Depression   . Diverticulosis   . GERD (gastroesophageal reflux disease)   . Internal hemorrhoids   . OA (osteoarthritis)   . Obesity, Class III, BMI 40-49.9 (morbid obesity) ([place])   . Osteopenia   . Tubular adenoma   . Urinary incontinence   . Vitamin D deficiency     Patient Active Problem List   Diagnosis Date Noted  . Neuropathic pain 12/15/2016  . Hiatal hernia   . Esophageal web   . History of colonic polyps   . Fibromyalgia 12/06/2015  . OSA (obstructive sleep apnea) 02/19/2015  . Elevated C-reactive protein (CRP) 01/31/2015  . Elevated sed rate 01/31/2015  . HTN (hypertension) 01/31/2015  . Subclinical hypothyroidism 01/08/2015  . Osteopenia 12/24/2014  . Lichen sclerosus of female genitalia 12/24/2014  . Polyarthralgia 12/01/2011  . Morbid obesity with BMI of 60.0-69.9, adult ([place])   . Anxiety and depression   . Vitamin D deficiency     Past Surgical History:  Procedure Laterality Date  . BREAST BIOPSY Right   . COLONOSCOPY WITH PROPOFOL N/A 07/07/2016   Procedure: COLONOSCOPY WITH  PROPOFOL;  Surgeon: Jerene Bears, MD;  Location: [facility];  Service: Gastroenterology;  Laterality: N/A;  . ESOPHAGOGASTRODUODENOSCOPY (EGD) WITH PROPOFOL N/A 07/07/2016   Procedure: ESOPHAGOGASTRODUODENOSCOPY (EGD) WITH PROPOFOL;  Surgeon: Jerene Bears, MD;  Location: [facility];  Service: Gastroenterology;  Laterality: N/A;  . HYSTEROSCOPY WITH D & C N/A 01/16/2015   Procedure: DILATATION AND CURETTAGE /HYSTEROSCOPY;  Surgeon: Donnamae Jude, MD;  Location: [facility];  Service: Gynecology;  Laterality: N/A;  . HYSTEROSCOPY WITH D & C N/A 06/06/2019   Procedure: DILATATION AND CURETTAGE /HYSTEROSCOPY;  Surgeon: Homero Fellers, MD;  Location: [facility];  Service: Gynecology;  Laterality: N/A;  . JOINT REPLACEMENT    . TOTAL KNEE ARTHROPLASTY  08/2008   Left     OB History    Gravida  2   Para  2   Term  2   Preterm      AB      Living  2     SAB      TAB      Ectopic      Multiple      Live Births  2           Family History  Problem Relation Age of Onset  . Cancer Mother 45       breast  . Breast cancer Mother 68  . Cancer Daughter 36       breast  . Breast cancer Daughter 16  . Cancer Father   .  Colon cancer Neg Hx   . Esophageal cancer Neg Hx   . Rectal cancer Neg Hx   . Stomach cancer Neg Hx     Social History   Tobacco Use  . Smoking status: Never Smoker  . Smokeless tobacco: Never Used  Substance Use Topics  . Alcohol use: No    Alcohol/week: 0.0 standard drinks  . Drug use: No    Home Medications Prior to Admission medications   Medication Sig Start Date End Date Taking? Authorizing Provider  ALPRAZolam Duanne Moron) 0.5 MG tablet Take 1 tablet (0.5 mg total) by mouth at bedtime as needed for anxiety. 08/02/19   Elby Beck, FNP  buPROPion (WELLBUTRIN SR) 150 MG 12 hr tablet TAKE 1 TABLET BY MOUTH TWICE A DAY 08/23/19   Elby Beck, FNP  clobetasol cream (TEMOVATE) [id number] % Apply 1 application topically 2 (two) times daily. X 6 wks,  then drop to once daily x 6 wks, then 2x/wk Patient taking differently: Apply 1 application topically 2 (two) times daily as needed (rash).  04/21/19   Elby Beck, FNP  hydrochlorothiazide (MICROZIDE) 12.5 MG capsule TAKE 1 CAPSULE BY MOUTH EVERY DAY 12/19/19   Elby Beck, FNP  ibuprofen (ADVIL) 600 MG tablet Take 1 tablet (600 mg total) by mouth every 6 (six) hours as needed. 06/06/19   Schuman, Stefanie Libel, MD  levothyroxine (SYNTHROID) 50 MCG tablet Take 1 tablet (50 mcg total) by mouth daily. **NEEDS OFFICE/VIRTUAL VISIT, NO FURTHER REFILLS 02/19/20   Elby Beck, FNP  methocarbamol (ROBAXIN) 500 MG tablet TAKE 1 TABLET (500 MG TOTAL) BY MOUTH EVERY 8 (EIGHT) HOURS AS NEEDED FOR MUSCLE SPASMS. 05/29/19   Elby Beck, FNP  Multiple Vitamin (MULTIVITAMIN WITH MINERALS) TABS tablet Take 1 tablet by mouth daily.    [provider]  predniSONE (STERAPRED UNI-PAK 21 TAB) 10 MG (21) TBPK tablet Dispense taper pack as directed 11/29/19   Earleen Newport, MD  pregabalin (LYRICA) 75 MG capsule Take 1 capsule (75 mg total) by mouth 2 (two) times daily. 01/08/20   Elby Beck, FNP  sertraline (ZOLOFT) 50 MG tablet TAKE 1 TABLET BY MOUTH EVERY DAY 10/11/19   Elby Beck, FNP    Allergies    Patient has no known allergies.  Review of Systems   Review of Systems All other systems negative except as documented in the HPI. All pertinent positives and negatives as reviewed in the HPI. Physical Exam Updated Vital Signs BP 133/76   Pulse 66   Temp 98.1 F (36.7 C) (Oral)   Resp (!) 21   SpO2 96%   Physical Exam Vitals and nursing note reviewed.  Constitutional:      General: She is not in acute distress.    Appearance: She is well-developed.  HENT:     Head: Normocephalic and atraumatic.  Eyes:     Pupils: Pupils are equal, round, and reactive to light.  Cardiovascular:     Rate and Rhythm: Normal rate and regular rhythm.     Heart sounds:  Normal heart sounds. No murmur. No friction rub. No gallop.   Pulmonary:     Effort: Pulmonary effort is normal. No respiratory distress.     Breath sounds: Normal breath sounds. No stridor. No wheezing or rhonchi.  Abdominal:     General: Bowel sounds are normal. There is no distension.     Palpations: Abdomen is soft.     Tenderness: There is no abdominal tenderness.  Musculoskeletal:     Cervical back: Normal range of motion and neck supple.  Skin:    General: Skin is warm and dry.     Capillary Refill: Capillary refill takes less than 2 seconds.     Findings: No erythema or rash.  Neurological:     Mental Status: She is alert and oriented to person, place, and time.     Motor: No abnormal muscle tone.     Coordination: Coordination normal.  Psychiatric:        Behavior: Behavior normal.     ED Results / Procedures / Treatments   Labs (all labs ordered are listed, but only abnormal results are displayed) Labs Reviewed  RESPIRATORY PANEL BY RT PCR (FLU A&B, COVID) - Abnormal; Notable for the following components:      Result Value   SARS Coronavirus 2 by RT PCR POSITIVE (*)    All other components within normal limits  COMPREHENSIVE METABOLIC PANEL - Abnormal; Notable for the following components:   Potassium 2.8 (*)    Glucose, Bld 107 (*)    GFR calc non Af Amer 60 (*)    Anion gap 16 (*)    All other components within normal limits  CBC WITH DIFFERENTIAL/PLATELET    EKG None  Radiology DG Chest Port 1 View  Result Date: 02/21/2020 CLINICAL DATA:  Cough and fever EXAM: PORTABLE CHEST 1 VIEW COMPARISON:  11/29/2019 FINDINGS: Mild patchy pulmonary infiltrate mainly seen about the left hilar lung. Normal heart size and mediastinal contours. No visible effusion or pneumothorax. Artifact from EKG leads IMPRESSION: Mild patchy pneumonia mainly seen on the left. Electronically Signed   By: Monte Fantasia M.D.   On: 02/21/2020 10:38    Procedures Procedures (including  critical care time)  Medications Ordered in ED Medications  bamlanivimab 700 mg in sodium chloride 0.9 % 120 mL IVPB (has no administration in time range)  0.9 %  sodium chloride infusion (has no administration in time range)  diphenhydrAMINE (BENADRYL) injection 50 mg (has no administration in time range)  famotidine (PEPCID) IVPB 20 mg premix (has no administration in time range)  methylPREDNISolone sodium succinate (SOLU-MEDROL) 125 mg/2 mL injection 125 mg (has no administration in time range)  albuterol (VENTOLIN HFA) 108 (90 Base) MCG/ACT inhaler 2 puff (has no administration in time range)  EPINEPHrine (EPI-PEN) injection 0.3 mg (has no administration in time range)  sodium chloride 0.9 % bolus 500 mL (0 mLs Intravenous Stopped 02/21/20 1125)  potassium chloride 10 mEq in 100 mL IVPB (10 mEq Intravenous New Bag/Given 02/21/20 1150)    ED Course  I have reviewed the triage vital signs and the nursing notes.  Pertinent labs & imaging results that were available during my care of the patient were reviewed by me and considered in my medical decision making (see chart for details).    MDM Rules/Calculators/A&P                     Patient does have signs of pneumonia on the chest x-ray.  Patient is given monoclonal antibody infusion here in the emergency department.  The patient was able to ambulate successfully she did not drop once to 89% but rebounded very quickly to 9495% where she had been.  I feel that the patient may be able to be discharged home due to the fact that she is not in any acute distress and her vital signs have remained stable.  The patient will be given treatment  at home for Covid.  I did advise her that her symptoms could worsen and that she would need to return at that time.  Joan Russo was evaluated in Emergency Department on 02/21/2020 for the symptoms described in the history of present illness. She was evaluated in the context of the global COVID-19 pandemic,  which necessitated consideration that the patient might be at risk for infection with the SARS-CoV-2 virus that causes COVID-19. Institutional protocols and algorithms that pertain to the evaluation of patients at risk for COVID-19 are in a state of rapid change based on information released by regulatory bodies including the CDC and federal and state organizations. These policies and algorithms were followed during the patient's care in the ED.   Final Clinical Impression(s) / ED Diagnoses Final diagnoses:  None    Rx / DC Orders ED Discharge Orders    None       Dalia Heading, PA-C 02/21/20 1449    Davonna Belling, MD 02/21/20 1459

## 2020-02-21 NOTE — ED Provider Notes (Signed)
Care handed off from [facility], PA-C with monoclonal antibody infusion pending.   Physical Exam  BP (!) 151/66   Pulse 70   Temp 98.1 F (36.7 C) (Oral)   Resp (!) 25   SpO2 96%   Physical Exam  No evidence of respiratory distress.   ED Course/Procedures     Procedures  MDM    Patient is Covid positive and is receiving monoclonal antibody infusion.  After infusion, she can be discharged.  Reevaluation.  Patient is resting comfortably.  She is hemodynamically stable.  I discussed at home supportive care measures. At this time, patient exhibits no emergent life-threatening condition that require further evaluation in ED or admission. Patient had ample opportunity for questions and discussion. All patient's questions were answered with full understanding. Strict return precautions discussed. Patient expresses understanding and agreement to plan.    Portions of this note were generated with [employer]. Dictation errors may occur despite best attempts at proofreading.   Volanda Napoleon, PA-C 02/21/20 2043    Maudie Flakes, MD 02/27/20 [phone]
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
## 2020-02-21 NOTE — Discharge Instructions (Addendum)
Return here as needed.  Your condition could worsen so you would need to return here soon as possible for any changes in your condition.  Increase your fluid intake and rest as much as possible.  Take Tylenol 1000 mg every 6 hours.  Take 50 mg of zinc and 5000 mg of vitamin D3 a day.

## 2020-02-21 NOTE — ED Notes (Signed)
Patient verbalizes understanding of discharge instructions. Opportunity for questioning and answers were provided. Armband removed by staff, pt discharged from ED.  

## 2020-02-21 NOTE — ED Notes (Signed)
Ambulated pt in room, SpO2 remained at 94-95%. Noted SpO2 would drop to 89% and come back to 94-95%.

## 2020-02-22 NOTE — Telephone Encounter (Signed)
Last OV 06/2019 for acute, 10/2018 for depression Last refill 08/23/2019 #160 x 1 refill.   Please advise, thanks.

## 2020-02-23 ENCOUNTER — Telehealth: Payer: Self-pay

## 2020-02-23 ENCOUNTER — Other Ambulatory Visit: Payer: Self-pay | Admitting: Family Medicine

## 2020-02-23 NOTE — Telephone Encounter (Signed)
Patient states she is feeling a lot better. Patient is very very tired. Having trouble breathing a little bit. Patient has been using Mucinex and Albuterol inhaler. She is trying to eat a little bit even though she can not really taste the food. Drinking Gatorade some. No vomiting. No fever. Patient lives by herself but her friend is coming over to help her and stay with her for a little while. Friend also has COVID and was at the hospital also. They are going to help each other as they can.  Advised patient to rest and let us know if she has any concerns or questions before we call her back next week to follow up again.

## 2020-02-23 NOTE — Telephone Encounter (Signed)
Noted  

## 2020-02-23 NOTE — Telephone Encounter (Signed)
Last refilled on 08/02/2019 #20 with 0 refill LOV 07/07/2019  No future appointments
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
## 2020-02-28 NOTE — Telephone Encounter (Signed)
Can discontinue monitoring. Instruct patient to call if condition changes.

## 2020-02-29 NOTE — Telephone Encounter (Signed)
Pt aware to contact our office if symptoms persist, worsen or if new symptoms arise. Nothing further needed.

## 2020-03-09 ENCOUNTER — Other Ambulatory Visit: Payer: Self-pay | Admitting: Family Medicine

## 2020-03-09 DIAGNOSIS — E039 Hypothyroidism, unspecified: Secondary | ICD-10-CM

## 2020-03-12 ENCOUNTER — Encounter: Payer: Self-pay | Admitting: Family Medicine

## 2020-03-12 ENCOUNTER — Ambulatory Visit (INDEPENDENT_AMBULATORY_CARE_PROVIDER_SITE_OTHER): Payer: Medicare Other | Admitting: Family Medicine

## 2020-03-12 ENCOUNTER — Other Ambulatory Visit: Payer: Self-pay

## 2020-03-12 ENCOUNTER — Ambulatory Visit: Payer: Medicare Other | Admitting: Family Medicine

## 2020-03-12 DIAGNOSIS — Z8616 Personal history of COVID-19: Secondary | ICD-10-CM | POA: Diagnosis not present

## 2020-03-12 DIAGNOSIS — F3289 Other specified depressive episodes: Secondary | ICD-10-CM

## 2020-03-12 MED ORDER — SERTRALINE HCL 100 MG PO TABS: 100.0000 mg | ORAL_TABLET | Freq: Every day | ORAL | 0 refills | Status: DC

## 2020-03-12 NOTE — Assessment & Plan Note (Addendum)
Worsening symptoms. Increase sertraline to 100 mg daily. Return in 4 weeks to see PCP. Encouraged therapy, exercise, and mindfulness practice.

## 2020-03-12 NOTE — Patient Instructions (Signed)
Covid symptoms can last up to 3 months. Many people still feel fatigue and achy for 1 month.  -  Can try tylenol or ibuprofen -  Go to the ER or call if severe symptoms - like chest pain or new Shortness of breath    How to help anxiety and depression  1) Regular Exercise - walking, jogging, cycling, dancing, strength training - aiming for 150 minutes of exercise a week --> Yoga has been shown in research to reduce depression and anxiety -- with even just one hour long session per week  2)  Begin a Mindfulness/Meditation practice -- this can take a little as 3 minutes and is helpful for all kinds of mood issues -- You can find resources in books -- Or you can download apps like  ---- Headspace App  ---- Calm  ---- Insignt Timer ---- Stop, Breathe & Think  # With each of these Apps - you should decline the "start free trial" offer and as you search through the App should be able to access some of their free content. You can also chose to pay for the content if you find one that works well for you.   # Many of them also offer sleep specific content which may help with insomnia  3) Healthy Diet -- Avoid or decrease Caffeine -- Avoid or decrease Alcohol -- Drink plenty of water, have a balanced diet -- Avoid cigarettes and marijuana (as well as other recreational drugs)  4) Find a therapist  -- [facility] is one option. Call [phone] -- Or you can check out www.psychologytoday.com -- you can read bios of therapists and see if they accept insurance -- Check with your insurance to see if you have coverage and who may take your insurance

## 2020-03-12 NOTE — Progress Notes (Signed)
I connected with Joan Russo on 03/12/20 at  9:40 AM EDT by video and verified that I am speaking with the correct person using two identifiers.   I discussed the limitations, risks, security and privacy concerns of performing an evaluation and management service by video and the availability of in person appointments. I also discussed with the patient that there may be a patient responsible charge related to this service. The patient expressed understanding and agreed to proceed.  Patient location: Home Provider Location: [place] Participants: Lesleigh Noe and Joan Russo   Subjective:     Joan Russo is a 64 y.o. female presenting for Depression and Generalized Body Aches (had COVID)     HPI   #Depression - Has been taking sertraline 66 since august 2020 - kept thinking it would get better - tries to "pick herself up" and tell people she is good - Did feel like the sertraline seemed to help initially - was feeling better - thinks the isolation has been hard - thinks she does have Seasonal depression most years -- tells people she is Solar activated - did therapy several years ago for about 1 year - not getting regular exercise since Covid started - does   #Hx of Covid - went to the hospital on Feb 24 - is out of quarantine period - still having generalized muscle aches   Review of Systems   Social History   Tobacco Use  Smoking Status Never Smoker  Smokeless Tobacco Never Used        Objective:   BP Readings from Last 3 Encounters:  02/21/20 (!) 151/66  11/29/19 (!) 144/81  06/12/19 130/72   Wt Readings from Last 3 Encounters:  11/29/19 263 lb (119.3 kg)  06/12/19 261 lb (118.4 kg)  06/06/19 259 lb 3.2 oz (117.6 kg)    There were no vitals taken for this visit.   Physical Exam Constitutional:      Appearance: Normal appearance. She is not ill-appearing.  HENT:     Head: Normocephalic and atraumatic.   Right Ear: External ear normal.     Left Ear: External ear normal.  Eyes:     Conjunctiva/sclera: Conjunctivae normal.  Pulmonary:     Effort: Pulmonary effort is normal. No respiratory distress.  Neurological:     Mental Status: She is alert. Mental status is at baseline.  Psychiatric:        Mood and Affect: Mood normal.        Behavior: Behavior normal.        Thought Content: Thought content normal.        Judgment: Judgment normal.      Depression screen [facility] 2/9 03/12/2020 11/21/2018 10/17/2018  Decreased Interest 3 1 3   Down, Depressed, Hopeless 2 1 3   PHQ - 2 Score 5 2 6   Altered sleeping 0 0 3  Tired, decreased energy 3 1 3   Change in appetite 3 2 3   Feeling bad or failure about yourself  0 1 3  Trouble concentrating 1 1 0  Moving slowly or fidgety/restless 0 0 0  Suicidal thoughts 0 0 0  PHQ-9 Score 12 7 18   Difficult doing work/chores Somewhat difficult - Very difficult           Assessment & Plan:   Problem List Items Addressed This Visit      Other   Depression - Primary    Worsening symptoms. Increase sertraline to 100 mg  daily. Return in 4 weeks to see PCP. Encouraged therapy, exercise, and mindfulness practice.       Relevant Medications   sertraline (ZOLOFT) 100 MG tablet   History of COVID-19    Suspect body aches are from recent infection. Monitor for worsening symptoms and discussed that aches could last up to 3 months.           Return in about 4 weeks (around 04/09/2020).  Lesleigh Noe, MD

## 2020-03-12 NOTE — Assessment & Plan Note (Signed)
Suspect body aches are from recent infection. Monitor for worsening symptoms and discussed that aches could last up to 3 months.

## 2020-03-27 ENCOUNTER — Emergency Department
Admission: EM | Admit: 2020-03-27 | Discharge: 2020-03-27 | Disposition: A | Discharge status: Home / Self Care | Payer: Medicare Other | Attending: Emergency Medicine | Admitting: Emergency Medicine

## 2020-03-27 ENCOUNTER — Telehealth: Payer: Self-pay | Admitting: Family Medicine

## 2020-03-27 ENCOUNTER — Other Ambulatory Visit: Payer: Self-pay

## 2020-03-27 DIAGNOSIS — Z5321 Procedure and treatment not carried out due to patient leaving prior to being seen by health care provider: Secondary | ICD-10-CM | POA: Insufficient documentation

## 2020-03-27 DIAGNOSIS — F1910 Other psychoactive substance abuse, uncomplicated: Secondary | ICD-10-CM | POA: Diagnosis present

## 2020-03-27 LAB — BASIC METABOLIC PANEL
Anion gap: 10 (ref 5–15)
BUN: 19 mg/dL (ref 8–23)
CO2: 26 mmol/L (ref 22–32)
Calcium: 9.7 mg/dL (ref 8.9–10.3)
Chloride: 106 mmol/L (ref 98–111)
Creatinine, Ser: 0.9 mg/dL (ref 0.44–1.00)
GFR calc Af Amer: >60 mL/min (ref >60)
GFR calc non Af Amer: >60 mL/min (ref >60)
Glucose, Bld: 127 mg/dL — ABNORMAL HIGH (ref 70–99)
Potassium: 3.6 mmol/L (ref 3.5–5.1)
Sodium: 142 mmol/L (ref 135–145)

## 2020-03-27 LAB — CBC
HCT: 41.7 % (ref 36.0–46.0)
Hemoglobin: 14 g/dL (ref 12.0–15.0)
MCH: 29.9 pg (ref 26.0–34.0)
MCHC: 33.6 g/dL (ref 30.0–36.0)
MCV: 89.1 fL (ref 80.0–100.0)
Platelets: 297 10*3/uL (ref 150–400)
RBC: 4.68 MIL/uL (ref 3.87–5.11)
RDW: 15.1 % (ref 11.5–15.5)
WBC: 10.2 10*3/uL (ref 4.0–10.5)
nRBC: 0 % (ref 0.0–0.2)

## 2020-03-27 LAB — URINE DRUG SCREEN, QUALITATIVE (ARMC ONLY)
Amphetamines, Ur Screen: NOT DETECTED
Barbiturates, Ur Screen: NOT DETECTED
Benzodiazepine, Ur Scrn: NOT DETECTED
Cannabinoid 50 Ng, Ur ~~LOC~~: POSITIVE — AB
Cocaine Metabolite,Ur ~~LOC~~: NOT DETECTED
MDMA (Ecstasy)Ur Screen: NOT DETECTED
Methadone Scn, Ur: NOT DETECTED
Opiate, Ur Screen: NOT DETECTED
Phencyclidine (PCP) Ur S: NOT DETECTED
Tricyclic, Ur Screen: NOT DETECTED

## 2020-03-27 LAB — URINALYSIS, COMPLETE (UACMP) WITH MICROSCOPIC
Bilirubin Urine: NEGATIVE
Glucose, UA: NEGATIVE mg/dL
Hgb urine dipstick: NEGATIVE
Ketones, ur: NEGATIVE mg/dL
Leukocytes,Ua: NEGATIVE
Nitrite: NEGATIVE
Protein, ur: NEGATIVE mg/dL
Specific Gravity, Urine: 1.004 — ABNORMAL LOW (ref 1.005–1.030)
pH: 8 (ref 5.0–8.0)

## 2020-03-27 LAB — ETHANOL: Alcohol, Ethyl (B): <10 mg/dL (ref <10)
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
## 2020-03-27 NOTE — ED Notes (Signed)
Pt called in the [place] with no response, called the contact number listed in the pt chart and left a message.
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
## 2020-03-28 ENCOUNTER — Encounter: Payer: Self-pay | Admitting: Family Medicine

## 2020-03-28 ENCOUNTER — Ambulatory Visit: Payer: Self-pay

## 2020-03-28 ENCOUNTER — Ambulatory Visit (INDEPENDENT_AMBULATORY_CARE_PROVIDER_SITE_OTHER): Payer: Medicare Other | Admitting: Family Medicine

## 2020-03-28 ENCOUNTER — Ambulatory Visit (INDEPENDENT_AMBULATORY_CARE_PROVIDER_SITE_OTHER): Payer: Medicare Other

## 2020-03-28 VITALS — BP 114/70 | HR 61 | Ht <= 58 in | Wt 264.8 lb

## 2020-03-28 DIAGNOSIS — M25561 Pain in right knee: Secondary | ICD-10-CM

## 2020-03-28 DIAGNOSIS — M85859 Other specified disorders of bone density and structure, unspecified thigh: Secondary | ICD-10-CM | POA: Diagnosis not present

## 2020-03-28 DIAGNOSIS — G8929 Other chronic pain: Secondary | ICD-10-CM

## 2020-03-28 DIAGNOSIS — Z6841 Body Mass Index (BMI) 40.0 and over, adult: Secondary | ICD-10-CM

## 2020-03-28 DIAGNOSIS — M1711 Unilateral primary osteoarthritis, right knee: Secondary | ICD-10-CM

## 2020-03-28 MED ORDER — VITAMIN D (ERGOCALCIFEROL) 1.25 MG (50000 UNIT) PO CAPS: 50000.0000 [IU] | ORAL_CAPSULE | ORAL | 0 refills | Status: DC

## 2020-03-28 NOTE — Patient Instructions (Addendum)
Good to see you.   Ice 20 minutes 2 times daily. Usually after activity and before bed.  Once weekly vitamin D for 12 weeks.   Tart cherry extract 1200mg  at night  We will work on approval for viscosupplementation.  See me again in 6 weeks

## 2020-03-28 NOTE — Progress Notes (Signed)
[facility] [address] [place] Phone: [phone] Subjective:   This visit occurred during the SARS-CoV-2 public health emergency.  Safety protocols were in place, including screening questions prior to the visit, additional usage of staff PPE, and extensive cleaning of exam room while observing appropriate contact time as indicated for disinfecting solutions.   I'm seeing this patient by the request  of:  Elby Beck, FNP  CC: R knee pain  I, Wendy Poet, LAT, ATC, am serving as scribe for Dr. Hulan Saas.  [id number]  Joan Russo is a 64 y.o. female coming in with complaint of right knee pain x years.  She was seen by [facility] in the past for her B knees.  She has had prior injections w her most recent being in January.  She is having swelling and mechanical symptoms in her R knee.  She had a R knee XR in January at [facility].    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  Wants to avoid surgical intervention.  Attempting to lose weight but secondary to the amount of pain unable to exercise regularly.     Past Medical History:  Diagnosis Date  . Anxiety   . Anxiety and depression   . Depression   . Diverticulosis   . GERD (gastroesophageal reflux disease)   . Internal hemorrhoids   . OA (osteoarthritis)   . Obesity, Class III, BMI 40-49.9 (morbid obesity) ([place])   . Osteopenia   . Tubular adenoma   . Urinary incontinence   . Vitamin D deficiency    Past Surgical History:  Procedure Laterality Date  . BREAST BIOPSY Right   . COLONOSCOPY WITH PROPOFOL N/A 07/07/2016   Procedure: COLONOSCOPY WITH PROPOFOL;  Surgeon: Jerene Bears, MD;  Location: [facility];  Service: Gastroenterology;  Laterality: N/A;  . ESOPHAGOGASTRODUODENOSCOPY (EGD) WITH PROPOFOL N/A  07/07/2016   Procedure: ESOPHAGOGASTRODUODENOSCOPY (EGD) WITH PROPOFOL;  Surgeon: Jerene Bears, MD;  Location: [facility];  Service: Gastroenterology;  Laterality: N/A;  . HYSTEROSCOPY WITH D & C N/A 01/16/2015   Procedure: DILATATION AND CURETTAGE /HYSTEROSCOPY;  Surgeon: Donnamae Jude, MD;  Location: [facility];  Service: Gynecology;  Laterality: N/A;  . HYSTEROSCOPY WITH D & C N/A 06/06/2019   Procedure: DILATATION AND CURETTAGE /HYSTEROSCOPY;  Surgeon: Homero Fellers, MD;  Location: [facility];  Service: Gynecology;  Laterality: N/A;  . JOINT REPLACEMENT    . TOTAL KNEE ARTHROPLASTY  08/2008   Left   Social History   Socioeconomic History  . Marital status: Widowed    Spouse name: Not on file  . Number of children: 4  . Years of education: Not on file  . Highest education level: Not on file  Occupational History  . Occupation: Armed forces technical officer  Tobacco Use  . Smoking status: Never Smoker  . Smokeless tobacco: Never Used  Substance and Sexual Activity  . Alcohol use: No    Alcohol/week: 0.0 standard drinks  . Drug use: No  . Sexual activity: Not Currently    Birth control/protection: Post-menopausal  Other Topics Concern  . Not on file  Social History Narrative  . Not on file   Social Determinants of Health   Financial Resource Strain:   . Difficulty of Paying Living Expenses:   Food Insecurity:   . Worried About [employer] of  Food in the Last Year:   . [place] in the Last Year:   Transportation Needs:   . Film/video editor (Medical):   Marland Kitchen Lack of Transportation (Non-Medical):   Physical Activity:   . Days of Exercise per Week:   . Minutes of Exercise per Session:   Stress:   . Feeling of Stress :   Social Connections:   . Frequency of Communication with Friends and Family:   . Frequency of Social Gatherings with Friends and Family:   . Attends Religious Services:   . Active Member of Clubs or Organizations:   . Attends Archivist  Meetings:   Marland Kitchen Marital Status:    No Known Allergies Family History  Problem Relation Age of Onset  . Cancer Mother 80       breast  . Breast cancer Mother 7  . Cancer Daughter 91       breast  . Breast cancer Daughter 28  . Cancer Father   . Colon cancer Neg Hx   . Esophageal cancer Neg Hx   . Rectal cancer Neg Hx   . Stomach cancer Neg Hx     Current Outpatient Medications (Endocrine & Metabolic):  .  levothyroxine (SYNTHROID) 50 MCG tablet, TAKE 1 TABLET (50 MCG TOTAL) BY MOUTH DAILY. **NEEDS OFFICE/VIRTUAL VISIT, NO FURTHER REFILLS  Current Outpatient Medications (Cardiovascular):  .  hydrochlorothiazide (MICROZIDE) 12.5 MG capsule, TAKE 1 CAPSULE BY MOUTH EVERY DAY (Patient taking differently: Take 12.5 mg by mouth daily. )  Current Outpatient Medications (Respiratory):  .  albuterol (VENTOLIN HFA) 108 (90 Base) MCG/ACT inhaler, Inhale 2 puffs into the lungs every 4 (four) hours as needed for wheezing or shortness of breath.  Current Outpatient Medications (Analgesics):  .  ibuprofen (ADVIL) 600 MG tablet, Take 1 tablet (600 mg total) by mouth every 6 (six) hours as needed. (Patient taking differently: Take 600 mg by mouth every 6 (six) hours as needed for headache. )   Current Outpatient Medications (Other):  Marland Kitchen  ALPRAZolam (XANAX) 0.5 MG tablet, TAKE 1 TABLET (0.5 MG TOTAL) BY MOUTH AT BEDTIME AS NEEDED FOR ANXIETY. Marland Kitchen  buPROPion (WELLBUTRIN SR) 150 MG 12 hr tablet, TAKE 1 TABLET BY MOUTH TWICE A DAY .  clobetasol cream (TEMOVATE) AB-123456789 %, Apply 1 application topically 2 (two) times daily. X 6 wks, then drop to once daily x 6 wks, then 2x/wk (Patient taking differently: Apply 1 application topically 2 (two) times daily as needed (rash). ) .  Multiple Vitamin (MULTIVITAMIN WITH MINERALS) TABS tablet, Take 1 tablet by mouth daily. .  pregabalin (LYRICA) 75 MG capsule, Take 1 capsule (75 mg total) by mouth 2 (two) times daily. .  sertraline (ZOLOFT) 100 MG tablet, Take 1 tablet  (100 mg total) by mouth daily. .  methocarbamol (ROBAXIN) 500 MG tablet, TAKE 1 TABLET (500 MG TOTAL) BY MOUTH EVERY 8 (EIGHT) HOURS AS NEEDED FOR MUSCLE SPASMS. (Patient not taking: Reported on 02/21/2020) .  Vitamin D, Ergocalciferol, (DRISDOL) 1.25 MG (50000 UNIT) CAPS capsule, Take 1 capsule (50,000 Units total) by mouth every 7 (seven) days.   Reviewed prior external information including notes and imaging from  primary care provider As well as notes that were available from care everywhere and other healthcare systems.  Past medical history, social, surgical and family history all reviewed in electronic medical record.  No pertanent information unless stated regarding to the chief complaint.   Review of Systems:  No headache, visual changes,  nausea, vomiting, diarrhea, constipation, dizziness, abdominal pain, skin rash, fevers, chills, night sweats, weight loss, swollen lymph nodes, body aches, joint swelling, chest pain, shortness of breath, mood changes. POSITIVE muscle aches  Objective  Blood pressure 114/70, pulse 61, height 4\' 10"  (1.473 m), weight 264 lb 12.8 oz (120.1 kg), SpO2 98 %.   General: No apparent distress alert and oriented x3 mood and affect normal, dressed appropriately.  Morbidly obese HEENT: Pupils equal, extraocular movements intact  Respiratory: Patient's speak in full sentences and does not appear short of breath  Cardiovascular: Trace lower extremity edema, non tender, no erythema  Neuro: Cranial nerves II through XII are intact, neurovascularly intact in all extremities with 2+ DTRs and 2+ pulses.  Gait antalgic gait Knee: Right valgus deformity noted.  Abnormal thigh to calf ratio.  Tender to palpation over medial and PF joint line.  ROM full in flexion and extension and lower leg rotation. instability with valgus force.  painful patellar compression. Patellar glide with moderate crepitus. Patellar and quadriceps tendons unremarkable. Hamstring and  quadriceps strength is normal. Contralateral knee shows mild arthritic changes as well but no instability noted today  After informed written and verbal consent, patient was seated on exam table. Right knee was prepped with alcohol swab and utilizing anterolateral approach, patient's right knee space was injected with 4:1  marcaine 0.5%: Kenalog 40mg /dL. Patient tolerated the procedure well without immediate complications.  Limited musculoskeletal ultrasound was performed and interpreted by Lyndal Pulley  Limited ultrasound of patient's right knee shows that patient does have moderate to severe narrowing of the patellofemoral joint with lateral translation noted.  Moderate narrowing of the medial joint space as well.  Trace effusion noted. Impression knee arthritis moderate to severe   Impression and Recommendations:     This case required medical decision making of moderate complexity. The above documentation has been reviewed and is accurate and complete Lyndal Pulley, DO       Note: This dictation was prepared with Dragon dictation along with smaller phrase technology. Any transcriptional errors that result from this process are unintentional.

## 2020-03-30 ENCOUNTER — Encounter: Payer: Self-pay | Admitting: Family Medicine

## 2020-03-30 DIAGNOSIS — M1711 Unilateral primary osteoarthritis, right knee: Secondary | ICD-10-CM | POA: Insufficient documentation

## 2020-03-30 NOTE — Assessment & Plan Note (Addendum)
Injection given and patient tolerated the procedure well.  Discussed icing regimen with home exercise, discussed which activities to do which wants to avoid.  Patient is to increase activity slowly over the course the next several weeks.  Discussed icing regimen.  Follow-up again in 4 to 8 weeks could be a candidate for viscosupplementation.  Discussed chronic medication use including ibuprofen, Robaxin, Lyrica and once weekly vitamin D prescribed today.

## 2020-03-30 NOTE — Assessment & Plan Note (Signed)
Significantly encourage weight loss.

## 2020-03-30 NOTE — Assessment & Plan Note (Signed)
Prescribed once weekly vitamin D

## 2020-04-04 ENCOUNTER — Other Ambulatory Visit: Payer: Self-pay | Admitting: Family Medicine

## 2020-04-04 DIAGNOSIS — E039 Hypothyroidism, unspecified: Secondary | ICD-10-CM

## 2020-04-07 ENCOUNTER — Other Ambulatory Visit: Payer: Self-pay | Admitting: Family Medicine

## 2020-04-07 DIAGNOSIS — M797 Fibromyalgia: Secondary | ICD-10-CM

## 2020-04-08 NOTE — Telephone Encounter (Signed)
DG-Plz see refill req/thx dmf 

## 2020-04-24 ENCOUNTER — Other Ambulatory Visit: Payer: Self-pay

## 2020-04-24 ENCOUNTER — Ambulatory Visit: Payer: Medicare Other

## 2020-04-24 ENCOUNTER — Telehealth: Payer: Self-pay

## 2020-04-24 NOTE — Telephone Encounter (Signed)
Called patient 3 times trying to complete her Medicare visit and she never answered. Left HIPAA complaint message on voicemail notifying patient to call the office and we will be glad to reschedule her. Appointment was cancelled.

## 2020-04-29 ENCOUNTER — Telehealth: Payer: Self-pay | Admitting: Family Medicine

## 2020-04-29 ENCOUNTER — Encounter: Payer: Self-pay | Admitting: Family Medicine

## 2020-04-29 NOTE — Telephone Encounter (Signed)
Left a detailed message on her voicemail - advised her to check her Mychart account as well.

## 2020-04-29 NOTE — Telephone Encounter (Signed)
Please call patient and advise [facility] to [facility]. They have sent me a letter that they have tried to call her. The number is [phone]. I have also sent her a [employer].

## 2020-05-09 ENCOUNTER — Ambulatory Visit (INDEPENDENT_AMBULATORY_CARE_PROVIDER_SITE_OTHER): Payer: Medicare Other | Admitting: Family Medicine

## 2020-05-09 ENCOUNTER — Other Ambulatory Visit: Payer: Self-pay

## 2020-05-09 ENCOUNTER — Encounter: Payer: Self-pay | Admitting: Family Medicine

## 2020-05-09 DIAGNOSIS — M1711 Unilateral primary osteoarthritis, right knee: Secondary | ICD-10-CM | POA: Diagnosis not present

## 2020-05-09 NOTE — Telephone Encounter (Signed)
Spoke with patient, aware of  information.   Nothing further needed.

## 2020-05-09 NOTE — Assessment & Plan Note (Signed)
Right knee pain seems to be a chronic problem with exacerbation.  Near end-stage arthritic changes.  Hopefully this will be beneficial.  Discussed bracing.  Follow-up again in 4 to 8 weeks

## 2020-05-09 NOTE — Progress Notes (Addendum)
Joan Russo [address] [place] Phone: [phone] Subjective:   I Kandace Blitz am serving as a Education administrator for Dr. Hulan Saas.  This visit occurred during the SARS-CoV-2 public health emergency.  Safety protocols were in place, including screening questions prior to the visit, additional usage of staff PPE, and extensive cleaning of exam room while observing appropriate contact time as indicated for disinfecting solutions.   I'm seeing this patient by the request  of:  Elby Beck, FNP  CC: Right knee pain follow-up  [id number]   03/28/2020 Injection given and patient tolerated the procedure well.  Discussed icing regimen with home exercise, discussed which activities to do which wants to avoid.  Patient is to increase activity slowly over the course the next several weeks.  Discussed icing regimen.  Follow-up again in 4 to 8 weeks could be a candidate for viscosupplementation.  Discussed chronic medication use including ibuprofen, Robaxin, Lyrica and once weekly vitamin D prescribed today.  Update 05/09/2020 Joan Russo is a 64 y.o. female coming in with complaint of right knee pain. Monovisc approved. Patient states she is doing better than before. Patient is limping today. States the knee is not stable.  Patient states and continues to have pain on a daily basis.  Affecting daily activities.  Does not want surgical intervention if she can help it.      Past Medical History:  Diagnosis Date  . Anxiety   . Anxiety and depression   . Depression   . Diverticulosis   . GERD (gastroesophageal reflux disease)   . Internal hemorrhoids   . OA (osteoarthritis)   . Obesity, Class III, BMI 40-49.9 (morbid obesity) ([place])   . Osteopenia   . Tubular adenoma   . Urinary incontinence   . Vitamin D deficiency    Past Surgical History:  Procedure Laterality Date  . BREAST BIOPSY Right   . COLONOSCOPY WITH PROPOFOL N/A 07/07/2016    Procedure: COLONOSCOPY WITH PROPOFOL;  Surgeon: Jerene Bears, MD;  [facility];  Service: Gastroenterology;  Laterality: N/A;  . ESOPHAGOGASTRODUODENOSCOPY (EGD) WITH PROPOFOL N/A 07/07/2016   Procedure: ESOPHAGOGASTRODUODENOSCOPY (EGD) WITH PROPOFOL;  Surgeon: Jerene Bears, MD;  [facility];  Service: Gastroenterology;  Laterality: N/A;  . HYSTEROSCOPY WITH D & C N/A 01/16/2015   Procedure: DILATATION AND CURETTAGE /HYSTEROSCOPY;  Surgeon: Donnamae Jude, MD;  Location: [facility];  Service: Gynecology;  Laterality: N/A;  . HYSTEROSCOPY WITH D & C N/A 06/06/2019   Procedure: DILATATION AND CURETTAGE /HYSTEROSCOPY;  Surgeon: Homero Fellers, MD;  Location: [facility];  Service: Gynecology;  Laterality: N/A;  . JOINT REPLACEMENT    . TOTAL KNEE ARTHROPLASTY  08/2008   Left   Social History   Socioeconomic History  . Marital status: Widowed    Spouse name: Not on file  . Number of children: 4  . Years of education: Not on file  . Highest education level: Not on file  Occupational History  . Occupation: Armed forces technical officer  Tobacco Use  . Smoking status: Never Smoker  . Smokeless tobacco: Never Used  Substance and Sexual Activity  . Alcohol use: No    Alcohol/week: 0.0 standard drinks  . Drug use: No  . Sexual activity: Not Currently    Birth control/protection: Post-menopausal  Other Topics Concern  . Not on file  Social History Narrative  . Not on file   Social Determinants of Health   Financial Resource  Strain:   . Difficulty of Paying Living Expenses:   Food Insecurity:   . Worried About [employer] fundraiser in the Last Year:   . Arboriculturist in the Last Year:   Transportation Needs:   . Film/video editor (Medical):   Marland Kitchen Lack of Transportation (Non-Medical):   Physical Activity:   . Days of Exercise per Week:   . Minutes of Exercise per Session:   Stress:   . Feeling of Stress :   Social Connections:   . Frequency of Communication with  Friends and Family:   . Frequency of Social Gatherings with Friends and Family:   . Attends Religious Services:   . Active Member of Clubs or Organizations:   . Attends Archivist Meetings:   Marland Kitchen Marital Status:    No Known Allergies Family History  Problem Relation Age of Onset  . Cancer Mother 82       breast  . Breast cancer Mother 68  . Cancer Daughter 94       breast  . Breast cancer Daughter 35  . Cancer Father   . Colon cancer Neg Hx   . Esophageal cancer Neg Hx   . Rectal cancer Neg Hx   . Stomach cancer Neg Hx     Current Outpatient Medications (Endocrine & Metabolic):  .  levothyroxine (SYNTHROID) 50 MCG tablet, TAKE 1 TABLET (50 MCG TOTAL) BY MOUTH DAILY. **NEEDS OFFICE/VIRTUAL VISIT, NO FURTHER REFILLS  Current Outpatient Medications (Cardiovascular):  .  hydrochlorothiazide (MICROZIDE) 12.5 MG capsule, TAKE 1 CAPSULE BY MOUTH EVERY DAY (Patient taking differently: Take 12.5 mg by mouth daily. )  Current Outpatient Medications (Respiratory):  .  albuterol (VENTOLIN HFA) 108 (90 Base) MCG/ACT inhaler, Inhale 2 puffs into the lungs every 4 (four) hours as needed for wheezing or shortness of breath.  Current Outpatient Medications (Analgesics):  .  ibuprofen (ADVIL) 600 MG tablet, Take 1 tablet (600 mg total) by mouth every 6 (six) hours as needed. (Patient taking differently: Take 600 mg by mouth every 6 (six) hours as needed for headache. )   Current Outpatient Medications (Other):  Marland Kitchen  ALPRAZolam (XANAX) 0.5 MG tablet, TAKE 1 TABLET (0.5 MG TOTAL) BY MOUTH AT BEDTIME AS NEEDED FOR ANXIETY. Marland Kitchen  buPROPion (WELLBUTRIN SR) 150 MG 12 hr tablet, TAKE 1 TABLET BY MOUTH TWICE A DAY .  clobetasol cream (TEMOVATE) AB-123456789 %, Apply 1 application topically 2 (two) times daily. X 6 wks, then drop to once daily x 6 wks, then 2x/wk (Patient taking differently: Apply 1 application topically 2 (two) times daily as needed (rash). ) .  methocarbamol (ROBAXIN) 500 MG tablet, TAKE 1  TABLET (500 MG TOTAL) BY MOUTH EVERY 8 (EIGHT) HOURS AS NEEDED FOR MUSCLE SPASMS. .  Multiple Vitamin (MULTIVITAMIN WITH MINERALS) TABS tablet, Take 1 tablet by mouth daily. .  pregabalin (LYRICA) 75 MG capsule, TAKE 1 CAPSULE BY MOUTH TWICE A DAY .  sertraline (ZOLOFT) 100 MG tablet, Take 1 tablet (100 mg total) by mouth daily. .  Vitamin D, Ergocalciferol, (DRISDOL) 1.25 MG (50000 UNIT) CAPS capsule, Take 1 capsule (50,000 Units total) by mouth every 7 (seven) days.   Reviewed prior external information including notes and imaging from  primary care provider As well as notes that were available from care everywhere and other healthcare systems.  Past medical history, social, surgical and family history all reviewed in electronic medical record.  No pertanent information unless stated regarding to the chief  complaint.   Review of Systems:  No headache, visual changes, nausea, vomiting, diarrhea, constipation, dizziness, abdominal pain, skin rash, fevers, chills, night sweats, weight loss, swollen lymph nodes, , chest pain, shortness of breath, mood changes. POSITIVE muscle aches body aches, joint swelling  Objective  Blood pressure 124/90, pulse (!) 55, height 4\' 10"  (1.473 m), weight 264 lb (119.7 kg), SpO2 96 %.   General: No apparent distress alert and oriented x3 mood and affect normal, dressed appropriately.  Overweight HEENT: Pupils equal, extraocular movements intact  Respiratory: Patient's speak in full sentences and does not appear short of breath  Cardiovascular: No lower extremity edema, non tender, no erythema  Neuro: Cranial nerves II through XII are intact, neurovascularly intact in all extremities with 2+ DTRs and 2+ pulses.  Gait antalgic MSK: Knee: Right valgus deformity noted. Large thigh to calf ratio.  Tender to palpation over medial and PF joint line.  ROM full in flexion and extension and lower leg rotation. instability with valgus force.  painful patellar  compression. Patellar glide with moderate crepitus. Patellar and quadriceps tendons unremarkable. Hamstring and quadriceps strength is normal.  After informed written and verbal consent, patient was seated on exam table. Right knee was prepped with alcohol swab and utilizing anterolateral approach, patient's right knee space was injected with 48 mg per 3 mL of Monovisc (sodium hyaluronate) in a prefilled syringe was injected easily into the knee through a 22-gauge needle..Patient tolerated the procedure well without immediate complications.    Impression and Recommendations:     This case required medical decision making of moderate complexity. The above documentation has been reviewed and is accurate and complete Lyndal Pulley, DO       Note: This dictation was prepared with Dragon dictation along with smaller phrase technology. Any transcriptional errors that result from this process are unintentional.

## 2020-05-09 NOTE — Patient Instructions (Signed)
Good to see you See me again in 6 weeks 

## 2020-05-10 ENCOUNTER — Other Ambulatory Visit: Payer: Self-pay | Admitting: Family Medicine

## 2020-05-10 ENCOUNTER — Other Ambulatory Visit: Payer: Self-pay

## 2020-05-10 ENCOUNTER — Ambulatory Visit
Admission: RE | Admit: 2020-05-10 | Discharge: 2020-05-10 | Disposition: A | Discharge status: Home / Self Care | Payer: Medicare Other | Source: Ambulatory Visit | Attending: Family Medicine | Admitting: Family Medicine

## 2020-05-10 DIAGNOSIS — R921 Mammographic calcification found on diagnostic imaging of breast: Secondary | ICD-10-CM

## 2020-05-10 DIAGNOSIS — R928 Other abnormal and inconclusive findings on diagnostic imaging of breast: Secondary | ICD-10-CM

## 2020-05-14 ENCOUNTER — Ambulatory Visit (INDEPENDENT_AMBULATORY_CARE_PROVIDER_SITE_OTHER): Payer: Medicare Other

## 2020-05-14 DIAGNOSIS — Z0000 Encounter for general adult medical examination without abnormal findings: Secondary | ICD-10-CM

## 2020-05-14 NOTE — Patient Instructions (Signed)
Joan Russo , Thank you for taking time to come for your Medicare Wellness Visit. I appreciate your ongoing commitment to your health goals. Please review the following plan we discussed and let me know if I can assist you in the future.   Screening recommendations/referrals: Colonoscopy: Up to date, completed 07/07/2016 Mammogram: Up to date, completed 05/10/2020 Bone Density: due Recommended yearly ophthalmology/optometry visit for glaucoma screening and checkup Recommended yearly dental visit for hygiene and checkup  Vaccinations: Influenza vaccine: Up to date, completed 08/30/2019 Pneumococcal vaccine: due Tdap vaccine: decline Shingles vaccine: discussed    Advanced directives: Please bring a copy of your POA (Power of Attorney) and/or Living Will to your next appointment.   Conditions/risks identified: hypertension  Next appointment: none   Preventive Care 34 Years and Older, Female Preventive care refers to lifestyle choices and visits with your health care provider that can promote health and wellness. What does preventive care include?  A yearly physical exam. This is also called an annual well check.  Dental exams once or twice a year.  Routine eye exams. Ask your health care provider how often you should have your eyes checked.  Personal lifestyle choices, including:  Daily care of your teeth and gums.  Regular physical activity.  Eating a healthy diet.  Avoiding tobacco and drug use.  Limiting alcohol use.  Practicing safe sex.  Taking low-dose aspirin every day.  Taking vitamin and mineral supplements as recommended by your health care provider. What happens during an annual well check? The services and screenings done by your health care provider during your annual well check will depend on your age, overall health, lifestyle risk factors, and family history of disease. Counseling  Your health care provider may ask you questions about your:  Alcohol  use.  Tobacco use.  Drug use.  Emotional well-being.  Home and relationship well-being.  Sexual activity.  Eating habits.  History of falls.  Memory and ability to understand (cognition).  Work and work Statistician.  Reproductive health. Screening  You may have the following tests or measurements:  Height, weight, and BMI.  Blood pressure.  Lipid and cholesterol levels. These may be checked every 5 years, or more frequently if you are over 58 years old.  Skin check.  Lung cancer screening. You may have this screening every year starting at age 79 if you have a 30-pack-year history of smoking and currently smoke or have quit within the past 15 years.  Fecal occult blood test (FOBT) of the stool. You may have this test every year starting at age 11.  Flexible sigmoidoscopy or colonoscopy. You may have a sigmoidoscopy every 5 years or a colonoscopy every 10 years starting at age 23.  Hepatitis C blood test.  Hepatitis B blood test.  Sexually transmitted disease (STD) testing.  Diabetes screening. This is done by checking your blood sugar (glucose) after you have not eaten for a while (fasting). You may have this done every 1-3 years.  Bone density scan. This is done to screen for osteoporosis. You may have this done starting at age 33.  Mammogram. This may be done every 1-2 years. Talk to your health care provider about how often you should have regular mammograms. Talk with your health care provider about your test results, treatment options, and if necessary, the need for more tests. Vaccines  Your health care provider may recommend certain vaccines, such as:  Influenza vaccine. This is recommended every year.  Tetanus, diphtheria, and acellular pertussis (  Tdap, Td) vaccine. You may need a Td booster every 10 years.  Zoster vaccine. You may need this after age 42.  Pneumococcal 13-valent conjugate (PCV13) vaccine. One dose is recommended after age  57.  Pneumococcal polysaccharide (PPSV23) vaccine. One dose is recommended after age 2. Talk to your health care provider about which screenings and vaccines you need and how often you need them. This information is not intended to replace advice given to you by your health care provider. Make sure you discuss any questions you have with your health care provider. Document Released: 01/10/2016 Document Revised: 09/02/2016 Document Reviewed: 10/15/2015 Elsevier Interactive Patient Education  2017 [place] Prevention in the Home Falls can cause injuries. They can happen to people of all ages. There are many things you can do to make your home safe and to help prevent falls. What can I do on the outside of my home?  Regularly fix the edges of walkways and driveways and fix any cracks.  Remove anything that might make you trip as you walk through a door, such as a raised step or threshold.  Trim any bushes or trees on the path to your home.  Use bright outdoor lighting.  Clear any walking paths of anything that might make someone trip, such as rocks or tools.  Regularly check to see if handrails are loose or broken. Make sure that both sides of any steps have handrails.  Any raised decks and porches should have guardrails on the edges.  Have any leaves, snow, or ice cleared regularly.  Use sand or salt on walking paths during winter.  Clean up any spills in your garage right away. This includes oil or grease spills. What can I do in the bathroom?  Use night lights.  Install grab bars by the toilet and in the tub and shower. Do not use towel bars as grab bars.  Use non-skid mats or decals in the tub or shower.  If you need to sit down in the shower, use a plastic, non-slip stool.  Keep the floor dry. Clean up any water that spills on the floor as soon as it happens.  Remove soap buildup in the tub or shower regularly.  Attach bath mats securely with double-sided  non-slip rug tape.  Do not have throw rugs and other things on the floor that can make you trip. What can I do in the bedroom?  Use night lights.  Make sure that you have a light by your bed that is easy to reach.  Do not use any sheets or blankets that are too big for your bed. They should not hang down onto the floor.  Have a firm chair that has side arms. You can use this for support while you get dressed.  Do not have throw rugs and other things on the floor that can make you trip. What can I do in the kitchen?  Clean up any spills right away.  Avoid walking on wet floors.  Keep items that you use a lot in easy-to-reach places.  If you need to reach something above you, use a strong step stool that has a grab bar.  Keep electrical cords out of the way.  Do not use floor polish or wax that makes floors slippery. If you must use wax, use non-skid floor wax.  Do not have throw rugs and other things on the floor that can make you trip. What can I do with my stairs?  Do  not leave any items on the stairs.  Make sure that there are handrails on both sides of the stairs and use them. Fix handrails that are broken or loose. Make sure that handrails are as long as the stairways.  Check any carpeting to make sure that it is firmly attached to the stairs. Fix any carpet that is loose or worn.  Avoid having throw rugs at the top or bottom of the stairs. If you do have throw rugs, attach them to the floor with carpet tape.  Make sure that you have a light switch at the top of the stairs and the bottom of the stairs. If you do not have them, ask someone to add them for you. What else can I do to help prevent falls?  Wear shoes that:  Do not have high heels.  Have rubber bottoms.  Are comfortable and fit you well.  Are closed at the toe. Do not wear sandals.  If you use a stepladder:  Make sure that it is fully opened. Do not climb a closed stepladder.  Make sure that both  sides of the stepladder are locked into place.  Ask someone to hold it for you, if possible.  Clearly mark and make sure that you can see:  Any grab bars or handrails.  First and last steps.  Where the edge of each step is.  Use tools that help you move around (mobility aids) if they are needed. These include:  Canes.  Walkers.  Scooters.  Crutches.  Turn on the lights when you go into a dark area. Replace any light bulbs as soon as they burn out.  Set up your furniture so you have a clear path. Avoid moving your furniture around.  If any of your floors are uneven, fix them.  If there are any pets around you, be aware of where they are.  Review your medicines with your doctor. Some medicines can make you feel dizzy. This can increase your chance of falling. Ask your doctor what other things that you can do to help prevent falls. This information is not intended to replace advice given to you by your health care provider. Make sure you discuss any questions you have with your health care provider. Document Released: 10/10/2009 Document Revised: 05/21/2016 Document Reviewed: 01/18/2015 Elsevier Interactive Patient Education  2017 [employer].

## 2020-05-14 NOTE — Progress Notes (Signed)
PCP notes:  Health Maintenance: Tdap- insurance/financial Dexa- due Prevnar 13- due Hep C- due  Abnormal Screenings: none   Patient concerns: Persistent dizziness    Nurse concerns: none   Next PCP appt.: none

## 2020-05-14 NOTE — Progress Notes (Signed)
Subjective:   Joan Russo is a 64 y.o. female who presents for an Initial Medicare Annual Wellness Visit.  Review of Systems: N/A      I connected with the patient today by telephone and verified that I am speaking with the correct person using two identifiers. Location patient: home Location nurse: work Persons participating in the virtual visit: patient, Marine scientist.   I discussed the limitations, risks, security and privacy concerns of performing an evaluation and management service by telephone and the availability of in person appointments. I also discussed with the patient that there may be a patient responsible charge related to this service. The patient expressed understanding and verbally consented to this telephonic visit.    Interactive audio and video telecommunications were attempted between this nurse and patient, however failed, due to patient having technical difficulties OR patient did not have access to video capability.  We continued and completed visit with audio only.      Cardiac Risk Factors include: advanced age (>18men, >60 women);hypertension     Objective:    Today's Vitals   05/14/20 1400  PainSc: 10-Worst pain ever   There is no height or weight on file to calculate BMI.  Advanced Directives 05/14/2020 03/27/2020 11/29/2019 06/06/2019 06/01/2019 07/07/2016 07/01/2016  Does Patient Have a Medical Advance Directive? Yes No Yes No Yes Yes Yes  Type of Paramedic of [place];Living will - Healthcare Power of [place];Living will  Does patient want to make changes to medical advance directive? - - No - Patient declined No - Patient declined No - Patient declined - No - Patient declined  Copy of [place] in Chart? No - copy requested - - - - No - copy requested No - copy requested  Would patient like information on creating a medical advance directive? - No -  Patient declined - No - Patient declined - - -    Current Medications (verified) Outpatient Encounter Medications as of 05/14/2020  Medication Sig  . albuterol (VENTOLIN HFA) 108 (90 Base) MCG/ACT inhaler Inhale 2 puffs into the lungs every 4 (four) hours as needed for wheezing or shortness of breath.  . ALPRAZolam (XANAX) 0.5 MG tablet TAKE 1 TABLET (0.5 MG TOTAL) BY MOUTH AT BEDTIME AS NEEDED FOR ANXIETY.  Marland Kitchen buPROPion (WELLBUTRIN SR) 150 MG 12 hr tablet TAKE 1 TABLET BY MOUTH TWICE A DAY  . clobetasol cream (TEMOVATE) [id number] % Apply 1 application topically 2 (two) times daily. X 6 wks, then drop to once daily x 6 wks, then 2x/wk (Patient taking differently: Apply 1 application topically 2 (two) times daily as needed (rash). )  . hydrochlorothiazide (MICROZIDE) 12.5 MG capsule TAKE 1 CAPSULE BY MOUTH EVERY DAY (Patient taking differently: Take 12.5 mg by mouth daily. )  . ibuprofen (ADVIL) 600 MG tablet Take 1 tablet (600 mg total) by mouth every 6 (six) hours as needed. (Patient taking differently: Take 600 mg by mouth every 6 (six) hours as needed for headache. )  . methocarbamol (ROBAXIN) 500 MG tablet TAKE 1 TABLET (500 MG TOTAL) BY MOUTH EVERY 8 (EIGHT) HOURS AS NEEDED FOR MUSCLE SPASMS.  . Multiple Vitamin (MULTIVITAMIN WITH MINERALS) TABS tablet Take 1 tablet by mouth daily.  . pregabalin (LYRICA) 75 MG capsule TAKE 1 CAPSULE BY MOUTH TWICE A DAY  . sertraline (ZOLOFT) 100 MG tablet Take 1 tablet (100 mg total) by mouth daily.  . Vitamin  D, Ergocalciferol, (DRISDOL) 1.25 MG (50000 UNIT) CAPS capsule Take 1 capsule (50,000 Units total) by mouth every 7 (seven) days.  Marland Kitchen levothyroxine (SYNTHROID) 50 MCG tablet TAKE 1 TABLET (50 MCG TOTAL) BY MOUTH DAILY. **NEEDS OFFICE/VIRTUAL VISIT, NO FURTHER REFILLS (Patient not taking: Reported on 05/14/2020)   No facility-administered encounter medications on file as of 05/14/2020.    Allergies (verified) Patient has no known allergies.   History: Past  Medical History:  Diagnosis Date  . Anxiety   . Anxiety and depression   . Depression   . Diverticulosis   . GERD (gastroesophageal reflux disease)   . Internal hemorrhoids   . OA (osteoarthritis)   . Obesity, Class III, BMI 40-49.9 (morbid obesity) ([place])   . Osteopenia   . Tubular adenoma   . Urinary incontinence   . Vitamin D deficiency    Past Surgical History:  Procedure Laterality Date  . BREAST BIOPSY Right   . COLONOSCOPY WITH PROPOFOL N/A 07/07/2016   Procedure: COLONOSCOPY WITH PROPOFOL;  Surgeon: Jerene Bears, MD;  Location: [facility];  Service: Gastroenterology;  Laterality: N/A;  . ESOPHAGOGASTRODUODENOSCOPY (EGD) WITH PROPOFOL N/A 07/07/2016   Procedure: ESOPHAGOGASTRODUODENOSCOPY (EGD) WITH PROPOFOL;  Surgeon: Jerene Bears, MD;  Location: [facility];  Service: Gastroenterology;  Laterality: N/A;  . HYSTEROSCOPY WITH D & C N/A 01/16/2015   Procedure: DILATATION AND CURETTAGE /HYSTEROSCOPY;  Surgeon: Donnamae Jude, MD;  Location: [facility];  Service: Gynecology;  Laterality: N/A;  . HYSTEROSCOPY WITH D & C N/A 06/06/2019   Procedure: DILATATION AND CURETTAGE /HYSTEROSCOPY;  Surgeon: Homero Fellers, MD;  Location: [facility];  Service: Gynecology;  Laterality: N/A;  . JOINT REPLACEMENT    . TOTAL KNEE ARTHROPLASTY  08/2008   Left   Family History  Problem Relation Age of Onset  . Cancer Mother 70       breast  . Breast cancer Mother 49  . Cancer Daughter 45       breast  . Breast cancer Daughter 71  . Cancer Father   . Colon cancer Neg Hx   . Esophageal cancer Neg Hx   . Rectal cancer Neg Hx   . Stomach cancer Neg Hx    Social History   Socioeconomic History  . Marital status: Widowed    Spouse name: Not on file  . Number of children: 4  . Years of education: Not on file  . Highest education level: Not on file  Occupational History  . Occupation: Armed forces technical officer  Tobacco Use  . Smoking status: Never Smoker  . Smokeless tobacco: Never Used    Substance and Sexual Activity  . Alcohol use: No    Alcohol/week: 0.0 standard drinks  . Drug use: No  . Sexual activity: Not Currently    Birth control/protection: Post-menopausal  Other Topics Concern  . Not on file  Social History Narrative  . Not on file   Social Determinants of Health   Financial Resource Strain: Low Risk   . Difficulty of Paying Living Expenses: Not hard at all  Food Insecurity: No Food Insecurity  . Worried About Charity fundraiser in the Last Year: Never true  . Ran Out of Food in the Last Year: Never true  Transportation Needs: No Transportation Needs  . Lack of Transportation (Medical): No  . Lack of Transportation (Non-Medical): No  Physical Activity: Inactive  . Days of Exercise per Week: 0 days  . Minutes of Exercise per Session: 0 min  Stress: No Stress Concern Present  . Feeling of Stress : Not at all  Social Connections:   . Frequency of Communication with Friends and Family:   . Frequency of Social Gatherings with Friends and Family:   . Attends Religious Services:   . Active Member of Clubs or Organizations:   . Attends Archivist Meetings:   Marland Kitchen Marital Status:     Tobacco Counseling Counseling given: Not Answered   Clinical Intake:  Pre-visit preparation completed: Yes  Pain : 0-10 Pain Score: 10-Worst pain ever Pain Type: Chronic pain Pain Location: Knee(lower back) Pain Orientation: Right Pain Descriptors / Indicators: Aching Pain Onset: More than a month ago Pain Frequency: Intermittent     Nutritional Risks: None Diabetes: No  How often do you need to have someone help you when you read instructions, pamphlets, or other written materials from your doctor or pharmacy?: 1 - Never What is the last grade level you completed in school?: some college  Interpreter Needed?: No  Information entered by :: CJohnson, LPN   Activities of Daily Living In your present state of health, do you have any difficulty  performing the following activities: 05/14/2020 06/01/2019  Hearing? N N  Vision? N N  Difficulty concentrating or making decisions? N N  Walking or climbing stairs? N Y  Comment - bad knees  Dressing or bathing? N N  Doing errands, shopping? N -  Preparing Food and eating ? N -  Using the Toilet? N -  In the past six months, have you accidently leaked urine? Y -  Comment stress incontinence -  Do you have problems with loss of bowel control? N -  Managing your Medications? N -  Managing your Finances? N -  Housekeeping or managing your Housekeeping? N -  Some recent data might be hidden     Immunizations and Health Maintenance Immunization History  Administered Date(s) Administered  . Fluad Quad(high Dose 65+) 08/29/2019  . Influenza Split 10/13/2011  . Influenza,inj,Quad PF,6+ Mos 09/19/2014, 10/14/2015, 08/28/2017, 10/17/2018   Health Maintenance Due  Topic Date Due  . Hepatitis C Screening  Never done  . COVID-19 Vaccine (1) Never done  . DEXA SCAN  04/08/2019  . PNA vac Low Risk Adult (1 of 2 - PCV13) Never done    Patient Care Team: Elby Beck, FNP as PCP - General (Nurse Practitioner)  Indicate any recent Medical Services you may have received from other than [facility] providers in the past year (date may be approximate).     Assessment:   This is a routine wellness examination for [place].  Hearing/Vision screen  Hearing Screening   125Hz  250Hz  500Hz  1000Hz  2000Hz  3000Hz  4000Hz  6000Hz  8000Hz   Right ear:           Left ear:           Vision Screening Comments: Patient gets annual eye exams   Dietary issues and exercise activities discussed: Current Exercise Habits: The patient does not participate in regular exercise at present, Exercise limited by: None identified  Goals    . Patient Stated     05/14/2020, I will start back going to the [facility] and exercise at least 3 days a week.       Depression Screen PHQ 2/9 Scores 05/14/2020 03/12/2020 11/21/2018  10/17/2018 08/26/2018 06/08/2016  PHQ - 2 Score 0 5 2 6  0 3  PHQ- 9 Score 0 12 7 18  - 11    Fall Risk Fall Risk  05/14/2020  Falls in the past year? 1  Comment dizzy spells  Number falls in past yr: 1  Injury with Fall? 0  Risk for fall due to : Impaired balance/gait;Medication side effect  Follow up Falls evaluation completed;Falls prevention discussed    Is the patient's home free of loose throw rugs in walkways, pet beds, electrical cords, etc?   yes      Grab bars in the bathroom? no      Handrails on the stairs?   yes      Adequate lighting?   yes  Timed Get Up and Go Performed: N/A  Cognitive Function: MMSE - Mini Mental State Exam 05/14/2020  Orientation to time 5  Orientation to Place 5  Registration 3  Attention/ Calculation 5  Recall 3  Language- repeat 1       Mini Cog  Mini-Cog screen was completed. Maximum score is 22. A value of 0 denotes this part of the MMSE was not completed or the patient failed this part of the Mini-Cog screening.  Screening Tests Health Maintenance  Topic Date Due  . Hepatitis C Screening  Never done  . COVID-19 Vaccine (1) Never done  . DEXA SCAN  04/08/2019  . PNA vac Low Risk Adult (1 of 2 - PCV13) Never done  . TETANUS/TDAP  05/14/2024 (Originally 10/13/2019)  . INFLUENZA VACCINE  07/28/2020  . MAMMOGRAM  05/10/2022  . COLONOSCOPY  07/07/2026    Qualifies for Shingles Vaccine: Yes   Cancer Screenings: Lung: Low Dose CT Chest recommended if Age 59-80 years, 30 pack-year currently smoking OR have quit w/in 15 years. Patient does not qualify. Breast: Up to date on Mammogram: Yes, completed 05/10/2020   Up to date of Bone Density/Dexa: No, due Colorectal: completed 07/07/2016  Additional Screenings:  Hepatitis C Screening: due     Plan:    Patient will start back going to the [facility] and exercise at least 3 days a week.  I have personally reviewed and noted the following in the patient's chart:   . Medical and social  history . Use of alcohol, tobacco or illicit drugs  . Current medications and supplements . Functional ability and status . Nutritional status . Physical activity . Advanced directives . List of other physicians . Hospitalizations, surgeries, and ER visits in previous 12 months . Vitals . Screenings to include cognitive, depression, and falls . Referrals and appointments  In addition, I have reviewed and discussed with patient certain preventive protocols, quality metrics, and best practice recommendations. A written personalized care plan for preventive services as well as general preventive health recommendations were provided to patient.     Andrez Grime, LPN   [id number]

## 2020-05-31 ENCOUNTER — Other Ambulatory Visit: Payer: Self-pay | Admitting: Family Medicine

## 2020-06-04 ENCOUNTER — Other Ambulatory Visit: Payer: Self-pay | Admitting: Family Medicine

## 2020-06-04 DIAGNOSIS — F3289 Other specified depressive episodes: Secondary | ICD-10-CM

## 2020-06-05 NOTE — Telephone Encounter (Signed)
Last OV and Refill 03/12/20 #[phone] x )   Please advise, thanks.

## 2020-06-11 ENCOUNTER — Encounter: Payer: Self-pay | Admitting: Family Medicine

## 2020-06-20 ENCOUNTER — Ambulatory Visit: Payer: Medicare Other | Admitting: Family Medicine

## 2020-06-20 NOTE — Progress Notes (Deleted)
Joan Russo Phone: [phone] Subjective:    I'm seeing this patient by the request  of:  Elby Beck, FNP  CC:   [id number]   05/09/2020 Right knee pain seems to be a chronic problem with exacerbation.  Near end-stage arthritic changes.  Hopefully this will be beneficial.  Discussed bracing.  Follow-up again in 4 to 8 weeks  Update 06/20/2020 Joan Russo is a 64 y.o. female coming in with complaint of right knee pain. Patient states   Onset-  Location Duration-  Character- Aggravating factors- Reliving factors-  Therapies tried-  Severity-     Past Medical History:  Diagnosis Date  . Anxiety   . Anxiety and depression   . Depression   . Diverticulosis   . GERD (gastroesophageal reflux disease)   . Internal hemorrhoids   . OA (osteoarthritis)   . Obesity, Class III, BMI 40-49.9 (morbid obesity) ([place])   . Osteopenia   . Tubular adenoma   . Urinary incontinence   . Vitamin D deficiency    Past Surgical History:  Procedure Laterality Date  . BREAST BIOPSY Right   . COLONOSCOPY WITH PROPOFOL N/A 07/07/2016   Procedure: COLONOSCOPY WITH PROPOFOL;  Surgeon: Jerene Bears, MD;  Location: [facility];  Service: Gastroenterology;  Laterality: N/A;  . ESOPHAGOGASTRODUODENOSCOPY (EGD) WITH PROPOFOL N/A 07/07/2016   Procedure: ESOPHAGOGASTRODUODENOSCOPY (EGD) WITH PROPOFOL;  Surgeon: Jerene Bears, MD;  Location: [facility];  Service: Gastroenterology;  Laterality: N/A;  . HYSTEROSCOPY WITH D & C N/A 01/16/2015   Procedure: DILATATION AND CURETTAGE /HYSTEROSCOPY;  Surgeon: Donnamae Jude, MD;  Location: [facility];  Service: Gynecology;  Laterality: N/A;  . HYSTEROSCOPY WITH D & C N/A 06/06/2019   Procedure: DILATATION AND CURETTAGE /HYSTEROSCOPY;  Surgeon: Homero Fellers, MD;  Location: [facility];  Service: Gynecology;  Laterality: N/A;  . JOINT REPLACEMENT    . TOTAL KNEE ARTHROPLASTY   08/2008   Left   Social History   Socioeconomic History  . Marital status: Widowed    Spouse name: Not on file  . Number of children: 4  . Years of education: Not on file  . Highest education level: Not on file  Occupational History  . Occupation: Armed forces technical officer  Tobacco Use  . Smoking status: Never Smoker  . Smokeless tobacco: Never Used  Vaping Use  . Vaping Use: Never used  Substance and Sexual Activity  . Alcohol use: No    Alcohol/week: 0.0 standard drinks  . Drug use: No  . Sexual activity: Not Currently    Birth control/protection: Post-menopausal  Other Topics Concern  . Not on file  Social History Narrative  . Not on file   Social Determinants of Health   Financial Resource Strain: Low Risk   . Difficulty of Paying Living Expenses: Not hard at all  Food Insecurity: No Food Insecurity  . Worried About Charity fundraiser in the Last Year: Never true  . Ran Out of Food in the Last Year: Never true  Transportation Needs: No Transportation Needs  . Lack of Transportation (Medical): No  . Lack of Transportation (Non-Medical): No  Physical Activity: Inactive  . Days of Exercise per Week: 0 days  . Minutes of Exercise per Session: 0 min  Stress: No Stress Concern Present  . Feeling of Stress : Not at all  Social Connections:   . Frequency of Communication with Friends and Family:   .  Frequency of Social Gatherings with Friends and Family:   . Attends Religious Services:   . Active Member of Clubs or Organizations:   . Attends Archivist Meetings:   Marland Kitchen Marital Status:    No Known Allergies Family History  Problem Relation Age of Onset  . Cancer Mother 70       breast  . Breast cancer Mother 7  . Cancer Daughter 93       breast  . Breast cancer Daughter 2  . Cancer Father   . Colon cancer Neg Hx   . Esophageal cancer Neg Hx   . Rectal cancer Neg Hx   . Stomach cancer Neg Hx     Current Outpatient Medications (Endocrine & Metabolic):    .  levothyroxine (SYNTHROID) 50 MCG tablet, TAKE 1 TABLET (50 MCG TOTAL) BY MOUTH DAILY. **NEEDS OFFICE/VIRTUAL VISIT, NO FURTHER REFILLS (Patient not taking: Reported on 05/14/2020)  Current Outpatient Medications (Cardiovascular):  .  hydrochlorothiazide (MICROZIDE) 12.5 MG capsule, TAKE 1 CAPSULE BY MOUTH EVERY DAY  Current Outpatient Medications (Respiratory):  .  albuterol (VENTOLIN HFA) 108 (90 Base) MCG/ACT inhaler, Inhale 2 puffs into the lungs every 4 (four) hours as needed for wheezing or shortness of breath.  Current Outpatient Medications (Analgesics):  .  ibuprofen (ADVIL) 600 MG tablet, Take 1 tablet (600 mg total) by mouth every 6 (six) hours as needed. (Patient taking differently: Take 600 mg by mouth every 6 (six) hours as needed for headache. )   Current Outpatient Medications (Other):  Marland Kitchen  ALPRAZolam (XANAX) 0.5 MG tablet, TAKE 1 TABLET (0.5 MG TOTAL) BY MOUTH AT BEDTIME AS NEEDED FOR ANXIETY. Marland Kitchen  buPROPion (WELLBUTRIN SR) 150 MG 12 hr tablet, TAKE 1 TABLET BY MOUTH TWICE A DAY .  clobetasol cream (TEMOVATE) 1.61 %, Apply 1 application topically 2 (two) times daily. X 6 wks, then drop to once daily x 6 wks, then 2x/wk (Patient taking differently: Apply 1 application topically 2 (two) times daily as needed (rash). ) .  methocarbamol (ROBAXIN) 500 MG tablet, TAKE 1 TABLET (500 MG TOTAL) BY MOUTH EVERY 8 (EIGHT) HOURS AS NEEDED FOR MUSCLE SPASMS. .  Multiple Vitamin (MULTIVITAMIN WITH MINERALS) TABS tablet, Take 1 tablet by mouth daily. .  pregabalin (LYRICA) 75 MG capsule, TAKE 1 CAPSULE BY MOUTH TWICE A DAY .  sertraline (ZOLOFT) 100 MG tablet, TAKE 1 TABLET BY MOUTH EVERY DAY .  Vitamin D, Ergocalciferol, (DRISDOL) 1.25 MG (50000 UNIT) CAPS capsule, Take 1 capsule (50,000 Units total) by mouth every 7 (seven) days.   Reviewed prior external information including notes and imaging from  primary care provider As well as notes that were available from care everywhere and other  healthcare systems.  Past medical history, social, surgical and family history all reviewed in electronic medical record.  No pertanent information unless stated regarding to the chief complaint.   Review of Systems:  No headache, visual changes, nausea, vomiting, diarrhea, constipation, dizziness, abdominal pain, skin rash, fevers, chills, night sweats, weight loss, swollen lymph nodes, body aches, joint swelling, chest pain, shortness of breath, mood changes. POSITIVE muscle aches  Objective  There were no vitals taken for this visit.   General: No apparent distress alert and oriented x3 mood and affect normal, dressed appropriately.  HEENT: Pupils equal, extraocular movements intact  Respiratory: Patient's speak in full sentences and does not appear short of breath  Cardiovascular: No lower extremity edema, non tender, no erythema  Neuro: Cranial nerves II through  XII are intact, neurovascularly intact in all extremities with 2+ DTRs and 2+ pulses.  Gait normal with good balance and coordination.  MSK:  Non tender with full range of motion and good stability and symmetric strength and tone of shoulders, elbows, wrist, hip, knee and ankles bilaterally.     Impression and Recommendations:     The above documentation has been reviewed and is accurate and complete Joan Russo       Note: This dictation was prepared with Dragon dictation along with smaller phrase technology. Any transcriptional errors that result from this process are unintentional.

## 2020-06-24 ENCOUNTER — Other Ambulatory Visit: Payer: Self-pay | Admitting: Family Medicine

## 2020-06-24 DIAGNOSIS — E039 Hypothyroidism, unspecified: Secondary | ICD-10-CM

## 2020-06-24 DIAGNOSIS — R7303 Prediabetes: Secondary | ICD-10-CM

## 2020-06-26 ENCOUNTER — Other Ambulatory Visit (INDEPENDENT_AMBULATORY_CARE_PROVIDER_SITE_OTHER): Payer: Medicare Other

## 2020-06-26 DIAGNOSIS — R7303 Prediabetes: Secondary | ICD-10-CM

## 2020-06-26 DIAGNOSIS — E039 Hypothyroidism, unspecified: Secondary | ICD-10-CM

## 2020-06-26 LAB — LIPID PANEL
Cholesterol: 228 mg/dL — ABNORMAL HIGH (ref 0–200)
HDL: 70.1 mg/dL (ref >39.00)
LDL Cholesterol: 139 mg/dL — ABNORMAL HIGH (ref 0–99)
NonHDL: 157.53
Total CHOL/HDL Ratio: 3
Triglycerides: 95 mg/dL (ref 0.0–149.0)
VLDL: 19 mg/dL (ref 0.0–40.0)

## 2020-06-26 LAB — TSH: TSH: 4.99 u[IU]/mL — ABNORMAL HIGH (ref 0.35–4.50)

## 2020-06-26 LAB — HEMOGLOBIN A1C: Hgb A1c MFr Bld: 5.6 % (ref 4.6–6.5)

## 2020-06-27 ENCOUNTER — Telehealth: Payer: Self-pay

## 2020-06-27 NOTE — Telephone Encounter (Signed)
Alice at front desk sent me team message as follows;  [12:55 PM] [name] !!! Another tricky one for ya. I got a Pharmacist, community request for a physical with [name]. MRN: [id number]. Patient is wanting just a physical, but did put in the notes she is experiencing dizziness. Can you check on her to see how frequent it is??? Thank you so much!  ?[12:55 PM] [name]     apologies!!!! Wrong MRN. The correct MRN: [id number]  Dizziness is doing a lot better now; before pt had had dizziness on and off for 6 months or more. Usually got dizziness with movement. Pt has not fallen. Pt has H/A on and off and not related to dizziness for 6 months or so; presently has H/A with pain level of 4 -5. No CP but does have SOB upon exertion on and off for 6 months, SOB more frequent than when began but pt said she is overweight. Pt scheduled 30' appt with Glenda Chroman FNP on 07/05/20 at 8 AM.offered sooner appt with another provider but pt wants to see Glenda Chroman FNP. UC and ED precautions given and pt voiced understanding. FYI to Glenda Chroman FNP.

## 2020-06-28 NOTE — Telephone Encounter (Signed)
Noted  

## 2020-07-05 ENCOUNTER — Other Ambulatory Visit: Payer: Self-pay

## 2020-07-05 ENCOUNTER — Encounter: Payer: Self-pay | Admitting: Family Medicine

## 2020-07-05 ENCOUNTER — Ambulatory Visit (INDEPENDENT_AMBULATORY_CARE_PROVIDER_SITE_OTHER): Payer: Medicare Other | Admitting: Family Medicine

## 2020-07-05 VITALS — BP 122/72 | HR 75 | Temp 97.6°F | Ht <= 58 in | Wt 270.1 lb

## 2020-07-05 DIAGNOSIS — F329 Major depressive disorder, single episode, unspecified: Secondary | ICD-10-CM

## 2020-07-05 DIAGNOSIS — E039 Hypothyroidism, unspecified: Secondary | ICD-10-CM

## 2020-07-05 DIAGNOSIS — M1711 Unilateral primary osteoarthritis, right knee: Secondary | ICD-10-CM

## 2020-07-05 DIAGNOSIS — R0609 Other forms of dyspnea: Secondary | ICD-10-CM

## 2020-07-05 DIAGNOSIS — F419 Anxiety disorder, unspecified: Secondary | ICD-10-CM

## 2020-07-05 DIAGNOSIS — G4733 Obstructive sleep apnea (adult) (pediatric): Secondary | ICD-10-CM

## 2020-07-05 DIAGNOSIS — R0982 Postnasal drip: Secondary | ICD-10-CM

## 2020-07-05 DIAGNOSIS — Z9989 Dependence on other enabling machines and devices: Secondary | ICD-10-CM

## 2020-07-05 DIAGNOSIS — K219 Gastro-esophageal reflux disease without esophagitis: Secondary | ICD-10-CM

## 2020-07-05 DIAGNOSIS — R0600 Dyspnea, unspecified: Secondary | ICD-10-CM

## 2020-07-05 DIAGNOSIS — F32A Depression, unspecified: Secondary | ICD-10-CM

## 2020-07-05 MED ORDER — LEVOTHYROXINE SODIUM 50 MCG PO TABS: 50.0000 ug | ORAL_TABLET | Freq: Every day | ORAL | 3 refills | Status: DC

## 2020-07-05 MED ORDER — FAMOTIDINE 40 MG PO TABS: 40.0000 mg | ORAL_TABLET | Freq: Every day | ORAL | 1 refills | Status: DC

## 2020-07-05 MED ORDER — ALPRAZOLAM 0.5 MG PO TABS: 0.5000 mg | ORAL_TABLET | Freq: Every evening | ORAL | 0 refills | Status: DC | PRN

## 2020-07-05 MED ORDER — FLUTICASONE PROPIONATE 50 MCG/ACT NA SUSP: 2.0000 | Freq: Every day | NASAL | 6 refills | Status: DC

## 2020-07-05 NOTE — Patient Instructions (Addendum)
Increase your water intake to 90 ounces a day- do gradually  Follow up in 3 months for your physical  Youtube- Dr. Sharman Cheek, Dr. Enrigue Catena Chair/ gentle yoga  Use fluticasone nasal spray twice a day for three days then once a day  I have sent in an acid reducer to take in the evening.  It is fine to eat 2 meals a day. Make sure to eat nutrient dense foods.   A resource that I like is www.dietdoctor.com/diabetes/diet  Here are some guidelines to help you with meal planning -  Avoid all processed and packaged foods (bread, pasta, crackers, chips, etc) and beverages containing calories.  Avoid added sugars and excessive natural sugars.  Attention to how you feel if you consume artificial sweeteners.  Do they make you more hungry or raise your blood sugar?  With every meal and snack, aim to get 20 g of protein (3 ounces of meat, 4 ounces of fish, 3 eggs, protein powder, 1 cup [place] yogurt, 1 cup cottage cheese, etc.)  Increase fiber in the form of non-starchy vegetables.  These help you feel full with very little carbohydrates and are good for gut health.  Have small amounts of good fats such as avocado, nuts, olive oil, nut butters, olives.  Add a little cheese to your salads to make them tasty.

## 2020-07-05 NOTE — Progress Notes (Signed)
Subjective:    Patient ID: Joan Russo, female    DOB: [birth year], 64 y.o.   MRN: [id number]  HPI Chief Complaint  Patient presents with  . Dizziness    x 6 months.   . Shortness of Breath    x 6 months, DOE. Pt wakes up in the mornings with increased Mucous in throat.  Marland Kitchen Headache    x 6 months, denies visual disturbance, N/V   This is a 64 yo female who presents today with above cc.   DOE- not sure if started after she had Covid. Using albuterol twice a day. Uses first thing in the morning to clear throat of mucus. Uses CPAP. Machine about 66 years old. Does not feel rested. No nasal congestion. Sleeps on 1 pillow, no PND. No daytime cough, no wheezing. Occasionally takes albuterol with DOE but no relief. No chest pain, palpitation or leg swelling. Has seen Dr. Gwenette Greet, pulmonary, many years ago.   Right knee pain- needs replacement. Can't have until weight down. Shots help temporarily. Has upcoming appointment with Dr. Tamala Julian.   Has indigestion and diarrhea after eating. Takes a couple of tums after eating, some relief. Does not matter what she eats.   Diet- breakfast- eggs, bacon, toast, Lunch- fruit (cantelope, cherries), Dinner- meat, potatoes, green veggie. Rare snacks, desserts. Drinks diet coke, 2-3.   Dizziness- has improved over last month, none now.    HTN- ran out of HCTZ awhile ago. No leg swelling.   Anxiety and depression- stress level good, rare use of alprazolam.      Review of Systems Per HPI    Objective:   Physical Exam Vitals reviewed.  Constitutional:      General: She is not in acute distress.    Appearance: She is well-developed. She is obese. She is not ill-appearing, toxic-appearing or diaphoretic.  HENT:     Head: Normocephalic and atraumatic.     Right Ear: Tympanic membrane, ear canal and external ear normal.     Left Ear: Ear canal and external ear normal.     Ears:     Comments: TM dull.  Eyes:     Conjunctiva/sclera: Conjunctivae  normal.  Cardiovascular:     Rate and Rhythm: Normal rate and regular rhythm.     Heart sounds: Normal heart sounds.  Pulmonary:     Effort: Pulmonary effort is normal.     Breath sounds: Normal breath sounds.  Musculoskeletal:     Cervical back: Normal range of motion and neck supple.     Right lower leg: No edema.     Left lower leg: No edema.     Comments: Slow, limping gait.   Skin:    General: Skin is warm and dry.  Neurological:     Mental Status: She is alert and oriented to person, place, and time.       BP 122/72 (BP Location: Left Wrist, Patient Position: Sitting, Cuff Size: Normal)   Pulse 75   Temp 97.6 F (36.4 C) (Temporal)   Ht 4\' 10"  (1.473 m)   Wt 270 lb 1.9 oz (122.5 kg)   SpO2 96%   BMI 56.46 kg/m  Wt Readings from Last 3 Encounters:  07/05/20 270 lb 1.9 oz (122.5 kg)  05/09/20 264 lb (119.7 kg)  03/28/20 264 lb 12.8 oz (120.1 kg)       Assessment & Plan:  1. Hypothyroidism, unspecified type - has been off med for awhile, including prior to having lab  checked.  - Discussed importance of compliance, will recheck TSH in 3 months - levothyroxine (SYNTHROID) 50 MCG tablet; Take 1 tablet (50 mcg total) by mouth daily. **NEEDS OFFICE/VIRTUAL VISIT, NO FURTHER REFILLS  Dispense: 90 tablet; Refill: 3  2. Anxiety and depression - doing well - ALPRAZolam (XANAX) 0.5 MG tablet; Take 1 tablet (0.5 mg total) by mouth at bedtime as needed for anxiety.  Dispense: 20 tablet; Refill: 0  3. Gastroesophageal reflux disease, unspecified whether esophagitis present - famotidine (PEPCID) 40 MG tablet; Take 1 tablet (40 mg total) by mouth at bedtime.  Dispense: 90 tablet; Refill: 1  4. DOE (dyspnea on exertion) - not sure if post covid related, unfortunately, can not do PFTs here in office, discussed role of weight and deconditioning, Encouraged weight loss and increased activity - Ambulatory referral to Pulmonology  5. OSA on CPAP - machine old, told her to bring  with her to appointment - Ambulatory referral to Pulmonology  6. PND (post-nasal drip) - fluticasone (FLONASE) 50 MCG/ACT nasal spray; Place 2 sprays into both nostrils daily.  Dispense: 16 g; Refill: 6  7. Primary osteoarthritis of right knee - follow up with Dr. Tamala Julian, needs to lose weight to get replaced  8. Morbid obesity - offered her referral to nutrition/ bariatrics, she declined - provided her written resources, encouraged her to consider low carb diet - needs to significantly increase water and activity as tolerated  - follow up in 3 months for CPE  This visit occurred during the SARS-CoV-2 public health emergency.  Safety protocols were in place, including screening questions prior to the visit, additional usage of staff PPE, and extensive cleaning of exam room while observing appropriate contact time as indicated for disinfecting solutions.    Over 40 minutes were spent face-to-face with the patient during this encounter and >50% of that time was spent on counseling and coordination of care   Clarene Reamer, FNP-BC  [facility], [place]  07/05/2020 8:47 AM

## 2020-07-17 ENCOUNTER — Other Ambulatory Visit: Payer: Self-pay | Admitting: Family Medicine

## 2020-07-17 DIAGNOSIS — M797 Fibromyalgia: Secondary | ICD-10-CM

## 2020-07-19 NOTE — Telephone Encounter (Signed)
Last refilled 04/08/20 #60 x 2 refills  Upcoming appt With Family Medicine Elby Beck, FNP) 10/02/2020 at 8:30 AM  Please advise, thanks.

## 2020-07-30 ENCOUNTER — Ambulatory Visit (INDEPENDENT_AMBULATORY_CARE_PROVIDER_SITE_OTHER): Payer: Medicare Other

## 2020-07-30 ENCOUNTER — Other Ambulatory Visit: Payer: Self-pay

## 2020-07-30 ENCOUNTER — Ambulatory Visit (INDEPENDENT_AMBULATORY_CARE_PROVIDER_SITE_OTHER): Payer: Medicare Other | Admitting: Family Medicine

## 2020-07-30 ENCOUNTER — Encounter: Payer: Self-pay | Admitting: Family Medicine

## 2020-07-30 VITALS — BP 132/76 | HR 74 | Ht <= 58 in | Wt 277.0 lb

## 2020-07-30 DIAGNOSIS — G8929 Other chronic pain: Secondary | ICD-10-CM

## 2020-07-30 DIAGNOSIS — M1711 Unilateral primary osteoarthritis, right knee: Secondary | ICD-10-CM

## 2020-07-30 DIAGNOSIS — M25561 Pain in right knee: Secondary | ICD-10-CM

## 2020-07-30 NOTE — Assessment & Plan Note (Signed)
Chronic problem with exacerbation.  Continues to have difficulty.  I do believe at this point PRP would be the other possibility.  Discussed the potential of repeating x-rays.  We need surgical intervention but secondary to patient's.  I did not know if she would be a surgical candidate at this time.  Patient will follow up with me again in 6 to 8 weeks to discuss further.

## 2020-07-30 NOTE — Patient Instructions (Addendum)
Xray today Write to me in a few weeks if not better can do PRP See me again in 8-10 weeks otherwise

## 2020-07-30 NOTE — Progress Notes (Signed)
[place] Phone: [phone] Subjective:   Fontaine No, am serving as a scribe for Dr. Hulan Saas. This visit occurred during the SARS-CoV-2 public health emergency.  Safety protocols were in place, including screening questions prior to the visit, additional usage of staff PPE, and extensive cleaning of exam room while observing appropriate contact time as indicated for disinfecting solutions.   I'm seeing this patient by the request  of:  Elby Beck, FNP  CC: Right knee pain follow-up  [id number]   05/09/2020 Right knee pain seems to be a chronic problem with exacerbation.  Near end-stage arthritic changes.  Hopefully this will be beneficial.  Discussed bracing.  Follow-up again in 4 to 8 weeks   Update 07/30/2020 NATSUKO KELSAY is a 64 y.o. female coming in with complaint of right knee pain. Patient states that steroid did not work as well as she hoped.  Patient continues to have pain and unfortunately has to stop every 2 to 300 feet with walking at the moment.  Patient denies though any true instability of the knee.  Patient rates the severity of pain is 6 out of 10 on a good day.       Past Medical History:  Diagnosis Date  . Anxiety   . Anxiety and depression   . Depression   . Diverticulosis   . GERD (gastroesophageal reflux disease)   . Internal hemorrhoids   . OA (osteoarthritis)   . Obesity, Class III, BMI 40-49.9 (morbid obesity) ([place])   . Osteopenia   . Tubular adenoma   . Urinary incontinence   . Vitamin D deficiency    Past Surgical History:  Procedure Laterality Date  . BREAST BIOPSY Right   . COLONOSCOPY WITH PROPOFOL N/A 07/07/2016   Procedure: COLONOSCOPY WITH PROPOFOL;  Surgeon: Jerene Bears, MD;  Location: [facility];  Service: Gastroenterology;  Laterality: N/A;  . ESOPHAGOGASTRODUODENOSCOPY (EGD) WITH PROPOFOL N/A 07/07/2016   Procedure:  ESOPHAGOGASTRODUODENOSCOPY (EGD) WITH PROPOFOL;  Surgeon: Jerene Bears, MD;  Location: [facility];  Service: Gastroenterology;  Laterality: N/A;  . HYSTEROSCOPY WITH D & C N/A 01/16/2015   Procedure: DILATATION AND CURETTAGE /HYSTEROSCOPY;  Surgeon: Donnamae Jude, MD;  Location: [facility];  Service: Gynecology;  Laterality: N/A;  . HYSTEROSCOPY WITH D & C N/A 06/06/2019   Procedure: DILATATION AND CURETTAGE /HYSTEROSCOPY;  Surgeon: Homero Fellers, MD;  Location: [facility];  Service: Gynecology;  Laterality: N/A;  . JOINT REPLACEMENT    . TOTAL KNEE ARTHROPLASTY  08/2008   Left   Social History   Socioeconomic History  . Marital status: Widowed    Spouse name: Not on file  . Number of children: 4  . Years of education: Not on file  . Highest education level: Not on file  Occupational History  . Occupation: Armed forces technical officer  Tobacco Use  . Smoking status: Never Smoker  . Smokeless tobacco: Never Used  Vaping Use  . Vaping Use: Never used  Substance and Sexual Activity  . Alcohol use: No    Alcohol/week: 0.0 standard drinks  . Drug use: No  . Sexual activity: Not Currently    Birth control/protection: Post-menopausal  Other Topics Concern  . Not on file  Social History Narrative  . Not on file   Social Determinants of Health   Financial Resource Strain: Low Risk   . Difficulty of Paying Living Expenses: Not hard at all  Food  Insecurity: No Food Insecurity  . Worried About Charity fundraiser in the Last Year: Never true  . Ran Out of Food in the Last Year: Never true  Transportation Needs: No Transportation Needs  . Lack of Transportation (Medical): No  . Lack of Transportation (Non-Medical): No  Physical Activity: Inactive  . Days of Exercise per Week: 0 days  . Minutes of Exercise per Session: 0 min  Stress: No Stress Concern Present  . Feeling of Stress : Not at all  Social Connections:   . Frequency of Communication with Friends and Family:   . Frequency of  Social Gatherings with Friends and Family:   . Attends Religious Services:   . Active Member of Clubs or Organizations:   . Attends Archivist Meetings:   Marland Kitchen Marital Status:    No Known Allergies Family History  Problem Relation Age of Onset  . Cancer Mother 46       breast  . Breast cancer Mother 74  . Cancer Daughter 53       breast  . Breast cancer Daughter 72  . Cancer Father   . Colon cancer Neg Hx   . Esophageal cancer Neg Hx   . Rectal cancer Neg Hx   . Stomach cancer Neg Hx     Current Outpatient Medications (Endocrine & Metabolic):  .  levothyroxine (SYNTHROID) 50 MCG tablet, Take 1 tablet (50 mcg total) by mouth daily. **NEEDS OFFICE/VIRTUAL VISIT, NO FURTHER REFILLS  Current Outpatient Medications (Cardiovascular):  .  hydrochlorothiazide (MICROZIDE) 12.5 MG capsule, TAKE 1 CAPSULE BY MOUTH EVERY DAY  Current Outpatient Medications (Respiratory):  .  albuterol (VENTOLIN HFA) 108 (90 Base) MCG/ACT inhaler, Inhale 2 puffs into the lungs every 4 (four) hours as needed for wheezing or shortness of breath. .  fluticasone (FLONASE) 50 MCG/ACT nasal spray, Place 2 sprays into both nostrils daily.  Current Outpatient Medications (Analgesics):  .  ibuprofen (ADVIL) 600 MG tablet, Take 1 tablet (600 mg total) by mouth every 6 (six) hours as needed. (Patient taking differently: Take 600 mg by mouth every 6 (six) hours as needed for headache. )   Current Outpatient Medications (Other):  Marland Kitchen  ALPRAZolam (XANAX) 0.5 MG tablet, Take 1 tablet (0.5 mg total) by mouth at bedtime as needed for anxiety. Marland Kitchen  buPROPion (WELLBUTRIN SR) 150 MG 12 hr tablet, TAKE 1 TABLET BY MOUTH TWICE A DAY .  clobetasol cream (TEMOVATE) 9.62 %, Apply 1 application topically 2 (two) times daily. X 6 wks, then drop to once daily x 6 wks, then 2x/wk (Patient taking differently: Apply 1 application topically 2 (two) times daily as needed (rash). ) .  famotidine (PEPCID) 40 MG tablet, Take 1 tablet (40  mg total) by mouth at bedtime. .  methocarbamol (ROBAXIN) 500 MG tablet, TAKE 1 TABLET (500 MG TOTAL) BY MOUTH EVERY 8 (EIGHT) HOURS AS NEEDED FOR MUSCLE SPASMS. .  Multiple Vitamin (MULTIVITAMIN WITH MINERALS) TABS tablet, Take 1 tablet by mouth daily. .  pregabalin (LYRICA) 75 MG capsule, TAKE 1 CAPSULE BY MOUTH TWICE A DAY .  sertraline (ZOLOFT) 100 MG tablet, TAKE 1 TABLET BY MOUTH EVERY DAY .  Vitamin D, Ergocalciferol, (DRISDOL) 1.25 MG (50000 UNIT) CAPS capsule, TAKE 1 CAPSULE (50,000 UNITS TOTAL) BY MOUTH EVERY 7 (SEVEN) DAYS   Reviewed prior external information including notes and imaging from  primary care provider As well as notes that were available from care everywhere and other healthcare systems.  Past medical history,  social, surgical and family history all reviewed in electronic medical record.  No pertanent information unless stated regarding to the chief complaint.   Review of Systems:  No headache, visual changes, nausea, vomiting, diarrhea, constipation, dizziness, abdominal pain, skin rash, fevers, chills, night sweats, weight loss, swollen lymph nodes, body aches, joint swelling, chest pain, shortness of breath, mood changes. POSITIVE muscle aches  Objective  Blood pressure 132/76, pulse 74, height 4\' 10"  (1.473 m), weight 277 lb (125.6 kg), SpO2 97 %.   General: No apparent distress alert and oriented x3 mood and affect normal, dressed appropriately.  Overweight HEENT: Pupils equal, extraocular movements intact  Respiratory: Patient's speak in full sentences and does not appear short of breath  Cardiovascular: Trace lower extremity edema, non tender, no erythema  Neuro: Cranial nerves II through XII are intact, neurovascularly intact in all extremities with 2+ DTRs and 2+ pulses.  Gait antalgic gait Knee: Right valgus deformity noted.  Abnormal thigh to calf ratio.  Tender to palpation over medial and PF joint line.  ROM full in flexion and extension and lower  leg rotation. instability with valgus force.  painful patellar compression. Patellar glide with moderate crepitus. Patellar and quadriceps tendons unremarkable. Hamstring and quadriceps strength is normal.  After informed written and verbal consent, patient was seated on exam table. Right knee was prepped with alcohol swab and utilizing anterolateral approach, patient's right knee space was injected with 4:1  marcaine 0.5%: Kenalog 40mg /dL. Patient tolerated the procedure well without immediate complications.    Impression and Recommendations:     The above documentation has been reviewed and is accurate and complete Lyndal Pulley, DO       Note: This dictation was prepared with Dragon dictation along with smaller phrase technology. Any transcriptional errors that result from this process are unintentional.

## 2020-08-13 ENCOUNTER — Encounter: Payer: Self-pay | Admitting: Family Medicine

## 2020-08-21 ENCOUNTER — Encounter: Payer: Self-pay | Admitting: Family Medicine

## 2020-08-21 ENCOUNTER — Ambulatory Visit (INDEPENDENT_AMBULATORY_CARE_PROVIDER_SITE_OTHER): Payer: Medicare Other | Admitting: Family Medicine

## 2020-08-21 ENCOUNTER — Other Ambulatory Visit: Payer: Self-pay

## 2020-08-21 VITALS — BP 126/84 | HR 79 | Temp 97.8°F | Ht <= 58 in | Wt 275.0 lb

## 2020-08-21 DIAGNOSIS — J309 Allergic rhinitis, unspecified: Secondary | ICD-10-CM | POA: Diagnosis not present

## 2020-08-21 DIAGNOSIS — E039 Hypothyroidism, unspecified: Secondary | ICD-10-CM | POA: Diagnosis not present

## 2020-08-21 DIAGNOSIS — E038 Other specified hypothyroidism: Secondary | ICD-10-CM

## 2020-08-21 DIAGNOSIS — Z289 Immunization not carried out for unspecified reason: Secondary | ICD-10-CM | POA: Diagnosis not present

## 2020-08-21 DIAGNOSIS — L659 Nonscarring hair loss, unspecified: Secondary | ICD-10-CM | POA: Diagnosis not present

## 2020-08-21 NOTE — Progress Notes (Signed)
Subjective:    Patient ID: Joan Russo, female    DOB: May 24, 1956, 64 y.o.   MRN: [id number]  HPI Chief Complaint  Patient presents with  . Hypothyroidism   This is a 64 yo female who presents today with several concerns. She is having symptoms that she thinks are related to her thyroid- left eye protrusion, left facial changes (loss of fat and muscle) and hair loss. History of subclinical hypothyroidism, last TSH 06/26/2020 was 4.99. Currently on levothyroxine 50 mcg.    Has noticed that left side of face has less muscle/ fat x 1 year, compared to right.   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 Has some blurred vision with looking at computer screen. This is longstanding and prior to eye exam. Was recommended that she use lubricating eye drops which she has not done. Vision improved after cataract surgery.   Runny nose, no ear pain, frequent sneezing/ hiccuping/ yawning every day. No meds for allergy symptoms. Was prescribed fluticasone nasal spray but has not been using.   Indigestion improved with famotidine.   Had Covid earlier this year. Has not been vaccinated. No objection to getting vaccine.   Exercise- has joined [employer] and is going to start going with her boyfriend, plans to do some cardio and light weight machine work   Review of Systems No headaches, change in vision    Objective:   Physical Exam Vitals reviewed.  Constitutional:      General: She is not in acute distress.    Appearance: Normal appearance. She is obese. She is not ill-appearing, toxic-appearing or diaphoretic.  HENT:     Head: Normocephalic and atraumatic. Hair is normal.     Comments: No visible difference in eyes, lids (bilateral hooding), no abnormality palpated of orbits, facial bones, muscles. ? Increased elasticity of skin of left cheek, slight decrease subcutaneous fat of left side. No visible difference.     Right Ear: External  ear normal.     Left Ear: External ear normal.     Nose: Nose normal.     Mouth/Throat:     Mouth: Mucous membranes are moist.     Pharynx: Oropharynx is clear.  Eyes:     Extraocular Movements: Extraocular movements intact.     Conjunctiva/sclera: Conjunctivae normal.     Pupils: Pupils are equal, round, and reactive to light.  Cardiovascular:     Rate and Rhythm: Normal rate and regular rhythm.     Heart sounds: Normal heart sounds.  Pulmonary:     Effort: Pulmonary effort is normal.     Breath sounds: Normal breath sounds.  Skin:    General: Skin is warm and dry.  Neurological:     Mental Status: She is alert and oriented to person, place, and time.     Cranial Nerves: No cranial nerve deficit.     Gait: Gait normal.  Psychiatric:        Mood and Affect: Mood normal.        Behavior: Behavior normal.        Thought Content: Thought content normal.        Judgment: Judgment normal.       BP 126/84   Pulse 79   Temp 97.8 F (36.6 C) (Temporal)   Wt 275 lb (124.7 kg)   SpO2 98%   BMI 57.48 kg/m  Wt Readings from Last 3 Encounters:  08/21/20 275 lb (  124.7 kg)  07/30/20 277 lb (125.6 kg)  07/05/20 270 lb 1.9 oz (122.5 kg)       Assessment & Plan:  1. Subclinical hypothyroidism - recheck thyroid labs in 8 weeks.  - TSH; Future - T4, Free; Future - T3; Future  2. COVID-19 virus vaccination not done - encouraged her to get ASAP  3. Hair loss - per patient, she has always had thin hair. Will recheck labs in 8 weeks  4. Allergic rhinitis, unspecified seasonality, unspecified trigger - has fluticasone nasal spray at home, encouraged her to do a trial to see if symptoms improve  Over 25 minutes were spent face-to-face with the patient during this encounter and >50% of that time was spent on counseling and coordination of care   This visit occurred during the SARS-CoV-2 public health emergency.  Safety protocols were in place, including screening questions  prior to the visit, additional usage of staff PPE, and extensive cleaning of exam room while observing appropriate contact time as indicated for disinfecting solutions.    Clarene Reamer, FNP-BC  [facility] [place] [facility]  08/21/2020 8:49 AM

## 2020-08-21 NOTE — Patient Instructions (Signed)
Start fluticasone nasal spray, use nightly as needed  Follow up in 8 weeks for labs, no need to be fasting

## 2020-09-04 ENCOUNTER — Telehealth: Payer: Self-pay

## 2020-09-04 NOTE — Telephone Encounter (Signed)
[facility] - Client TELEPHONE ADVICE RECORD [facility] Patient Name: Joan Russo Gender: Female DOB: [birth year] Age: 64 Y 73 M 28 D Return Phone Number: [phone] (Primary), [phone] (Secondary), [phone] (Alternate) [place] [address] Client [facility] - Client Client Site [place] - Day Physician Tor Netters- NP Contact Type Call Who Is Calling Patient / Member / Family / Caregiver Call Type Triage / Clinical Relationship To Patient Self Return Phone Number [phone] (Primary) Chief Complaint Skin Lesion - Moles/ Lumps/ Growths Reason for Call Symptomatic / Request for Joan Russo states she has been a bump on her back that is now turning black. Caller states the bump was swollen and red at first. Translation No Nurse Assessment Nurse: Thad Ranger, RN, Langley Gauss Date/Time (Eastern Time): 09/04/2020 9:40:23 AM Confirm and document reason for call. If symptomatic, describe symptoms. ---Caller states she has been a bump on her back that is now turning black. Caller states the bump was swollen and red at first. States it started out as a blister w/swelling, redness. Blister opened and now it is a black hole w/black/bruised areas around it. Site is itching and painful if touched. Has the patient had close contact with a person known or suspected to have the novel coronavirus illness OR traveled / lives in area with major community spread (including international travel) in the last 14 days from the onset of symptoms? * If Asymptomatic, screen for exposure and travel within the last 14 days. ---No Does the patient have any new or worsening symptoms? ---Yes Will a triage be completed? ---Yes Related visit to physician within the last 2 weeks? ---No Does the PT have any chronic conditions? (i.e. diabetes, asthma, this includes High  risk factors for pregnancy, etc.) ---No Is this a behavioral health or substance abuse call? ---No Guidelines Guideline Title Affirmed Question Affirmed Notes Nurse Date/Time (Eastern Time) Sores [1] Looks infected (spreading redness, pus) AND [2] large red area (> 2 in. or 5 cm) Carmon, RN, Langley Gauss 09/04/2020 9:43:16 AM PLEASE NOTE: All timestamps contained within this report are represented as [place] Standard Time. CONFIDENTIALTY NOTICE: This fax transmission is intended only for the addressee. It contains information that is legally privileged, confidential or otherwise protected from use or disclosure. If you are not the intended recipient, you are strictly prohibited from reviewing, disclosing, copying using or disseminating any of this information or taking any action in reliance on or regarding this information. If you have received this fax in error, please notify us immediately by telephone so that we can arrange for its return to [place]. Phone: [phone], Toll-Free: [phone], Fax: [phone] Page: 2 of 2 Call Id: [id number] [place]. Time Eilene Ghazi Time) Disposition Final User 09/04/2020 9:48:47 AM Call Completed Thad Ranger, RN, Langley Gauss 09/04/2020 9:48:07 AM See HCP within 4 Hours (or PCP triage) Yes Carmon, RN, Yevette Edwards Disagree/Comply Comply Caller Understands Yes PreDisposition Call Doctor Care Advice Given Per Guideline SEE HCP (OR PCP TRIAGE) WITHIN 4 HOURS: CALL BACK IF: * You become worse CARE ADVICE given per Sores (Adult) guideline. Comments User: Romeo Apple, RN Date/Time Eilene Ghazi Time): 09/04/2020 9:48:35 AM Called [facility] for appt and no appts available. Referrals GO TO FACILITY UNDECIDED

## 2020-09-04 NOTE — Telephone Encounter (Signed)
Noted, will evaluate. 

## 2020-09-04 NOTE — Telephone Encounter (Signed)
Pt already has appt scheduled with Gentry Fitz NP on 09/05/20 at 9 AM. Sending note to Gentry Fitz NP.

## 2020-09-05 ENCOUNTER — Encounter: Payer: Self-pay | Admitting: Primary Care

## 2020-09-05 ENCOUNTER — Ambulatory Visit (INDEPENDENT_AMBULATORY_CARE_PROVIDER_SITE_OTHER): Payer: Medicare Other | Admitting: Primary Care

## 2020-09-05 ENCOUNTER — Other Ambulatory Visit: Payer: Self-pay

## 2020-09-05 VITALS — BP 124/78 | HR 72 | Ht <= 58 in | Wt 277.0 lb

## 2020-09-05 DIAGNOSIS — W57XXXA Bitten or stung by nonvenomous insect and other nonvenomous arthropods, initial encounter: Secondary | ICD-10-CM

## 2020-09-05 DIAGNOSIS — Z23 Encounter for immunization: Secondary | ICD-10-CM

## 2020-09-05 DIAGNOSIS — S40262A Insect bite (nonvenomous) of left shoulder, initial encounter: Secondary | ICD-10-CM | POA: Diagnosis not present

## 2020-09-05 MED ORDER — HYDROXYZINE HCL 10 MG PO TABS: 10.0000 mg | ORAL_TABLET | Freq: Three times a day (TID) | ORAL | 0 refills | Status: DC | PRN

## 2020-09-05 NOTE — Assessment & Plan Note (Signed)
Site of concern consistent for non venomous insect bite without cellulitis or abscess. Appears to be healing well without complications.  Will provide Rx of hydroxyzine to use PRN for itching, drowsiness precautions provided.

## 2020-09-05 NOTE — Patient Instructions (Signed)
Continue to monitor the insect bite site.  You may take hydroxyzine every 8 hours as needed for itching.  It was a pleasure meeting you!

## 2020-09-05 NOTE — Progress Notes (Signed)
Subjective:    Patient ID: Joan Russo, female    DOB: [birth year], 64 y.o.   MRN: [id number]  HPI  This visit occurred during the SARS-CoV-2 public health emergency.  Safety protocols were in place, including screening questions prior to the visit, additional usage of staff PPE, and extensive cleaning of exam room while observing appropriate contact time as indicated for disinfecting solutions.   [name] is a 64 year old female of [place] Netters with a history of hypertension, OSA, morbid obesity, polyarthralgia, fibromyalgia, neuropathic pain who presents today to discuss skin lesion.  The lesion is located to the posterior left shoulder for which she initially noted four days ago, developed a blister. While asleep that night the blister popped. Symptoms include itching, swelling, redness. Over the last several days she's noticed the redness move down the shoulder and back. Today she believes the site has improved.  Earlier that day (before symptoms began) she was outside at an outdoor baby shower for a few hours. Doesn't remember being bitten by an insect. She's been applying OTC cortisone and clobetasol with some improvement.   She denies increased joint aches, fevers, chills, body aches.   Review of Systems  Constitutional: Negative for chills and fever.  Skin: Positive for rash and wound.  Neurological: Negative for headaches.       Past Medical History:  Diagnosis Date  . Anxiety   . Anxiety and depression   . Depression   . Diverticulosis   . GERD (gastroesophageal reflux disease)   . Internal hemorrhoids   . OA (osteoarthritis)   . Obesity, Class III, BMI 40-49.9 (morbid obesity) ([place])   . Osteopenia   . Tubular adenoma   . Urinary incontinence   . Vitamin D deficiency      Social History   Socioeconomic History  . Marital status: Widowed    Spouse name: Not on file  . Number of children: 4  . Years of education: Not on file  . Highest education  level: Not on file  Occupational History  . Occupation: Armed forces technical officer  Tobacco Use  . Smoking status: Never Smoker  . Smokeless tobacco: Never Used  Vaping Use  . Vaping Use: Never used  Substance and Sexual Activity  . Alcohol use: No    Alcohol/week: 0.0 standard drinks  . Drug use: No  . Sexual activity: Not Currently    Birth control/protection: Post-menopausal  Other Topics Concern  . Not on file  Social History Narrative  . Not on file   Social Determinants of Health   Financial Resource Strain: Low Risk   . Difficulty of Paying Living Expenses: Not hard at all  Food Insecurity: No Food Insecurity  . Worried About Charity fundraiser in the Last Year: Never true  . Ran Out of Food in the Last Year: Never true  Transportation Needs: No Transportation Needs  . Lack of Transportation (Medical): No  . Lack of Transportation (Non-Medical): No  Physical Activity: Inactive  . Days of Exercise per Week: 0 days  . Minutes of Exercise per Session: 0 min  Stress: No Stress Concern Present  . Feeling of Stress : Not at all  Social Connections:   . Frequency of Communication with Friends and Family: Not on file  . Frequency of Social Gatherings with Friends and Family: Not on file  . Attends Religious Services: Not on file  . Active Member of Clubs or Organizations: Not on file  . Attends Club  or Organization Meetings: Not on file  . Marital Status: Not on file  Intimate Partner Violence: Not At Risk  . Fear of Current or Ex-Partner: No  . Emotionally Abused: No  . Physically Abused: No  . Sexually Abused: No    Past Surgical History:  Procedure Laterality Date  . BREAST BIOPSY Right   . COLONOSCOPY WITH PROPOFOL N/A 07/07/2016   Procedure: COLONOSCOPY WITH PROPOFOL;  Surgeon: Jerene Bears, MD;  Location: [facility];  Service: Gastroenterology;  Laterality: N/A;  . ESOPHAGOGASTRODUODENOSCOPY (EGD) WITH PROPOFOL N/A 07/07/2016   Procedure:  ESOPHAGOGASTRODUODENOSCOPY (EGD) WITH PROPOFOL;  Surgeon: Jerene Bears, MD;  Location: [facility];  Service: Gastroenterology;  Laterality: N/A;  . HYSTEROSCOPY WITH D & C N/A 01/16/2015   Procedure: DILATATION AND CURETTAGE /HYSTEROSCOPY;  Surgeon: Donnamae Jude, MD;  Location: [facility];  Service: Gynecology;  Laterality: N/A;  . HYSTEROSCOPY WITH D & C N/A 06/06/2019   Procedure: DILATATION AND CURETTAGE /HYSTEROSCOPY;  Surgeon: Homero Fellers, MD;  Location: [facility];  Service: Gynecology;  Laterality: N/A;  . JOINT REPLACEMENT    . TOTAL KNEE ARTHROPLASTY  08/2008   Left    Family History  Problem Relation Age of Onset  . Cancer Mother 40       breast  . Breast cancer Mother 40  . Cancer Daughter 60       breast  . Breast cancer Daughter 31  . Cancer Father   . Colon cancer Neg Hx   . Esophageal cancer Neg Hx   . Rectal cancer Neg Hx   . Stomach cancer Neg Hx     No Known Allergies  Current Outpatient Medications on File Prior to Visit  Medication Sig Dispense Refill  . albuterol (VENTOLIN HFA) 108 (90 Base) MCG/ACT inhaler Inhale 2 puffs into the lungs every 4 (four) hours as needed for wheezing or shortness of breath. 3.7 g 0  . ALPRAZolam (XANAX) 0.5 MG tablet Take 1 tablet (0.5 mg total) by mouth at bedtime as needed for anxiety. 20 tablet 0  . buPROPion (WELLBUTRIN SR) 150 MG 12 hr tablet TAKE 1 TABLET BY MOUTH TWICE A DAY 180 tablet 1  . clobetasol cream (TEMOVATE) 0.07 % Apply 1 application topically 2 (two) times daily. X 6 wks, then drop to once daily x 6 wks, then 2x/wk (Patient taking differently: Apply 1 application topically 2 (two) times daily as needed (rash). ) 30 g 5  . famotidine (PEPCID) 40 MG tablet Take 1 tablet (40 mg total) by mouth at bedtime. 90 tablet 1  . fluticasone (FLONASE) 50 MCG/ACT nasal spray Place 2 sprays into both nostrils daily. 16 g 6  . hydrochlorothiazide (MICROZIDE) 12.5 MG capsule TAKE 1 CAPSULE BY MOUTH EVERY DAY 90 capsule 0  .  ibuprofen (ADVIL) 600 MG tablet Take 1 tablet (600 mg total) by mouth every 6 (six) hours as needed. (Patient taking differently: Take 600 mg by mouth every 6 (six) hours as needed for headache. ) 60 tablet 3  . levothyroxine (SYNTHROID) 50 MCG tablet Take 1 tablet (50 mcg total) by mouth daily. **NEEDS OFFICE/VIRTUAL VISIT, NO FURTHER REFILLS 90 tablet 3  . methocarbamol (ROBAXIN) 500 MG tablet TAKE 1 TABLET (500 MG TOTAL) BY MOUTH EVERY 8 (EIGHT) HOURS AS NEEDED FOR MUSCLE SPASMS. 60 tablet 2  . Multiple Vitamin (MULTIVITAMIN WITH MINERALS) TABS tablet Take 1 tablet by mouth daily.    . pregabalin (LYRICA) 75 MG capsule TAKE 1 CAPSULE BY MOUTH  TWICE A DAY 60 capsule 2  . sertraline (ZOLOFT) 100 MG tablet TAKE 1 TABLET BY MOUTH EVERY DAY 90 tablet 1  . Vitamin D, Ergocalciferol, (DRISDOL) 1.25 MG (50000 UNIT) CAPS capsule TAKE 1 CAPSULE (50,000 UNITS TOTAL) BY MOUTH EVERY 7 (SEVEN) DAYS 12 capsule 0   No current facility-administered medications on file prior to visit.    BP 124/78   Pulse 72   Ht 4\' 10"  (1.473 m)   Wt 277 lb (125.6 kg)   SpO2 93%   BMI 57.89 kg/m    Objective:   Physical Exam Pulmonary:     Effort: Pulmonary effort is normal.  Skin:    General: Skin is warm and dry.     Findings: Erythema and rash present.     Comments: Scabbing and mild erythema with opening representative of an insect bite. Surrounding light erythema to left posterior shoulder. Non tender. No drainage. No warmth.  Neurological:     Mental Status: She is alert and oriented to person, place, and time.            Assessment & Plan:

## 2020-09-25 ENCOUNTER — Ambulatory Visit (INDEPENDENT_AMBULATORY_CARE_PROVIDER_SITE_OTHER): Payer: Medicare Other

## 2020-09-25 ENCOUNTER — Other Ambulatory Visit: Payer: Self-pay

## 2020-09-25 ENCOUNTER — Ambulatory Visit (INDEPENDENT_AMBULATORY_CARE_PROVIDER_SITE_OTHER): Payer: Medicare Other | Admitting: Pulmonary Disease

## 2020-09-25 ENCOUNTER — Encounter: Payer: Self-pay | Admitting: Pulmonary Disease

## 2020-09-25 VITALS — BP 132/70 | HR 84 | Ht <= 58 in | Wt 279.8 lb

## 2020-09-25 DIAGNOSIS — Z8616 Personal history of COVID-19: Secondary | ICD-10-CM

## 2020-09-25 DIAGNOSIS — E669 Obesity, unspecified: Secondary | ICD-10-CM | POA: Diagnosis not present

## 2020-09-25 DIAGNOSIS — R05 Cough: Secondary | ICD-10-CM | POA: Diagnosis not present

## 2020-09-25 DIAGNOSIS — J90 Pleural effusion, not elsewhere classified: Secondary | ICD-10-CM | POA: Diagnosis not present

## 2020-09-25 DIAGNOSIS — Z9989 Dependence on other enabling machines and devices: Secondary | ICD-10-CM | POA: Diagnosis not present

## 2020-09-25 DIAGNOSIS — G4730 Sleep apnea, unspecified: Secondary | ICD-10-CM | POA: Diagnosis not present

## 2020-09-25 DIAGNOSIS — R059 Cough, unspecified: Secondary | ICD-10-CM

## 2020-09-25 DIAGNOSIS — R0600 Dyspnea, unspecified: Secondary | ICD-10-CM

## 2020-09-25 DIAGNOSIS — G4733 Obstructive sleep apnea (adult) (pediatric): Secondary | ICD-10-CM | POA: Diagnosis not present

## 2020-09-25 DIAGNOSIS — R0609 Other forms of dyspnea: Secondary | ICD-10-CM

## 2020-09-25 NOTE — Progress Notes (Signed)
[name]  Chief Complaint  Patient presents with  . Consult    referred for osa, denies problems, needs cpap supplies, no DME, buys online, DOE, has cough in mornings, feels congestion in throat first thing in the morning    Constitutional:  BP 132/70 (BP Location: Left Arm, Cuff Size: Large)   Pulse 84   Ht 4\' 10"  (1.473 m)   Wt 279 lb 12.8 oz (126.9 kg)   SpO2 96%   BMI 58.48 kg/m   Past Medical History:  Anxiety, Depression, Diverticulosis, GERD, OA, Osteopenia, Colon polyps, Vit D Deficiency, Urinary incontinence, COVID 19 Pneumonia February 2021, Hypothyroidism, Fibromyalgia, Neuropathic pain  Past Surgical History:  Her  has a past surgical history that includes Total knee arthroplasty (08/2008); Hysteroscopy with D & C (N/A, 01/16/2015); Esophagogastroduodenoscopy (egd) with propofol (N/A, 07/07/2016); Colonoscopy with propofol (N/A, 07/07/2016); Joint replacement; Hysteroscopy with D & C (N/A, 06/06/2019); and Breast biopsy (Right).  Brief Summary:  Joan Russo is a 64 y.o. female with obstructive sleep apnea and dyspnea.      Subjective:   She was previously seen by Dr. Gwenette Greet.  Had home sleep study in 2016.  Showed moderate obstructive sleep apnea.  She was started on auto CPAP and then changed to CPAP 8 cm H2O.  She got supplies through [facility].  She had COVID 19 pneumonia in February 2021.  Chest xray from 02/21/20 showed patchy infiltrates Lt > Rt.  She hasn't had f/u CXR. She was needing to use albuterol when she had pneumonia, but not since.  She has occasional cough with clear sputum.  Mostly in the morning and thinks this comes from post nasal drip after wearing CPAP.  Not having wheeze, fever, chest pain, leg swelling.  Has gained weight since she had COVID.  As a result she gets winded more easily when walking, but recovers after few minutes of resting.  She goes to sleep at 10 pm.  She falls asleep 5 minutes.  She  wakes up some times to use the bathroom.  She gets out of bed at 7 am.  She feels okay in the morning.  She denies morning headache.  She does not use anything to help her fall sleep or stay awake.  She denies sleep walking, sleep talking, bruxism, or nightmares.  There is no history of restless legs.  She denies sleep hallucinations, sleep paralysis, or cataplexy.  The Epworth score is 4 out of 24.    Physical Exam:   Appearance - well kempt   ENMT - no sinus tenderness, no oral exudate, no LAN, Mallampati 3 airway, no stridor  Respiratory - equal breath sounds bilaterally, no wheezing or rales  CV - s1s2 regular rate and rhythm, no murmurs  Ext - no clubbing, no edema  Skin - no rashes  Psych - normal mood and affect   Pulmonary testing:    Chest Imaging:    Sleep Tests:   HST 03/25/15 >> AHI 21, SpO2 low 59%  Auto CPAP 08/26/30 to 09/24/20 >> used on 29 of 30 nights with average 9 hrs 7 min.  Average AHI 0.5 with median CPAP 14 and 95 th percentile CPAP 15 cm H2O.  Cardiac Tests:    Social History:  She  reports that she has never smoked. She has never used smokeless tobacco. She reports that she does not drink alcohol and does not use drugs.  Family History:  Her family history includes Breast  cancer (age of onset: 75) in her daughter; Breast cancer (age of onset: 38) in her mother; Cancer in her father; Cancer (age of onset: 33) in her daughter; Cancer (age of onset: 69) in her mother.     Assessment/Plan:   Obstructive sleep apnea. - reviewed her previous sleep study with her - discussed how untreated sleep apnea can impact her health - she used [employer] for her DME - she is compliant with CPAP and reports benefit from therapy - her machine is more than 66 yrs old, not functioning properly and not amenable to repair - will arrange for new auto CPAP with range 8 to 15 cm H2O - will arrange for CPAP mask refitting - explained that she might need to have repeat  home sleep study for insurance purposes prior to getting new CPAP machine  Dyspnea on exertion after COVID 19 pneumonia in February 2021. - she has gained weight over the past several months and hasn't been very active; deconditioning likely playing a significant role - will arrange for follow up chest xray; depending on result will determine if she needs PFT  Morbid obesity with BMI 58.48. - discussed options to assist with weight loss  Time Spent Involved in Patient Care on Day of Examination:  46 minutes  Follow up:  Patient Instructions  Chest xray today  Will arrange for new CPAP machine and mask refitting  Follow up in 3 months   Medication List:   Allergies as of 09/25/2020   No Known Allergies     Medication List       Accurate as of September 25, 2020 10:42 AM. If you have any questions, ask your nurse or doctor.        STOP taking these medications   albuterol 108 (90 Base) MCG/ACT inhaler Commonly known as: VENTOLIN HFA Stopped by: Chesley Mires, MD   hydrochlorothiazide 12.5 MG capsule Commonly known as: MICROZIDE Stopped by: Chesley Mires, MD   methocarbamol 500 MG tablet Commonly known as: ROBAXIN Stopped by: Chesley Mires, MD     TAKE these medications   ALPRAZolam 0.5 MG tablet Commonly known as: XANAX Take 1 tablet (0.5 mg total) by mouth at bedtime as needed for anxiety.   buPROPion 150 MG 12 hr tablet Commonly known as: WELLBUTRIN SR TAKE 1 TABLET BY MOUTH TWICE A DAY   clobetasol cream 0.05 % Commonly known as: TEMOVATE Apply 1 application topically 2 (two) times daily. X 6 wks, then drop to once daily x 6 wks, then 2x/wk What changed:   when to take this  reasons to take this  additional instructions   famotidine 40 MG tablet Commonly known as: PEPCID Take 1 tablet (40 mg total) by mouth at bedtime.   fluticasone 50 MCG/ACT nasal spray Commonly known as: FLONASE Place 2 sprays into both nostrils daily.   hydrOXYzine 10 MG  tablet Commonly known as: ATARAX/VISTARIL Take 1-2 tablets (10-20 mg total) by mouth 3 (three) times daily as needed for itching.   ibuprofen 600 MG tablet Commonly known as: ADVIL Take 1 tablet (600 mg total) by mouth every 6 (six) hours as needed. What changed: reasons to take this   levothyroxine 50 MCG tablet Commonly known as: SYNTHROID Take 1 tablet (50 mcg total) by mouth daily. **NEEDS OFFICE/VIRTUAL VISIT, NO FURTHER REFILLS   multivitamin with minerals Tabs tablet Take 1 tablet by mouth daily.   pregabalin 75 MG capsule Commonly known as: LYRICA TAKE 1 CAPSULE BY MOUTH TWICE A DAY  sertraline 100 MG tablet Commonly known as: ZOLOFT TAKE 1 TABLET BY MOUTH EVERY DAY   Vitamin D (Ergocalciferol) 1.25 MG (50000 UNIT) Caps capsule Commonly known as: DRISDOL TAKE 1 CAPSULE (50,000 UNITS TOTAL) BY MOUTH EVERY 7 (SEVEN) DAYS       Signature:  Chesley Mires, MD [place] Pager - [phone] 09/25/2020, 10:42 AM

## 2020-09-25 NOTE — Patient Instructions (Signed)
Chest xray today  Will arrange for new CPAP machine and mask refitting  Follow up in 3 months

## 2020-09-29 DIAGNOSIS — Z23 Encounter for immunization: Secondary | ICD-10-CM | POA: Diagnosis not present

## 2020-09-30 ENCOUNTER — Telehealth: Payer: Self-pay | Admitting: Pulmonary Disease

## 2020-09-30 NOTE — Telephone Encounter (Signed)
Entered in error

## 2020-10-01 ENCOUNTER — Ambulatory Visit: Payer: Medicare Other | Admitting: Family Medicine

## 2020-10-02 ENCOUNTER — Encounter: Payer: Medicare Other | Admitting: Family Medicine

## 2020-10-03 ENCOUNTER — Ambulatory Visit: Payer: Medicare Other | Admitting: Family Medicine

## 2020-10-16 ENCOUNTER — Other Ambulatory Visit: Payer: Medicare Other

## 2020-10-28 ENCOUNTER — Other Ambulatory Visit: Payer: Self-pay | Admitting: Family Medicine

## 2020-10-28 DIAGNOSIS — M797 Fibromyalgia: Secondary | ICD-10-CM

## 2020-10-28 NOTE — Telephone Encounter (Signed)
Refill request Lyrica Last refill 07/19/20 #60/2 Last office visit 09/05/20 acute

## 2020-11-25 NOTE — Progress Notes (Signed)
I, Joan Russo, LAT, ATC, am serving as scribe for Dr. Lynne Russo.  Joan Russo is a 64 y.o. female who presents to [facility] at [facility] today for f/u of R knee pain.  She was last seen by Dr. Tamala Russo on 07/30/20 and had a R knee injection.  Since her last visit, pt reports her R knee pain has flared up over the last month.  She does report some intermittent swelling in her R knee.  She also reports L hip pain and is curious if she can get a L hip injection too.  She notes chronic LBP and also reports having a poor workstation where she has to sit w/ her back/trunk slightlyrotated to the L.  Diagnostic testing: R knee XR- 07/30/20   Pertinent review of systems: No fevers or chills  Relevant historical information: Hypertension, morbid obesity, left total knee replacement.  Frequent falls   Exam:  BP 120/70 (BP Location: Left Arm, Patient Position: Sitting, Cuff Size: Large)   Pulse 75   Ht 4\' 10"  (1.473 m)   Wt 282 lb (127.9 kg)   SpO2 94%   BMI 58.94 kg/m  General: Well Developed, well nourished, and in no acute distress.   MSK: Right knee obese small joint effusion.  Normal motion with crepitation.  Stable ligamentous exam.  Left hip normal-appearing tender palpation greater trochanter normal motion.  Some pain with resisted hip abduction.    Lab and Radiology Results  Procedure: Real-time Ultrasound Guided Injection of right knee superior lateral patellar space Device: Philips Affiniti 50G Images permanently stored and available for review in PACS Verbal informed consent obtained.  Discussed risks and benefits of procedure. Warned about infection bleeding damage to structures skin hypopigmentation and fat atrophy among others. Patient expresses understanding and agreement Time-out conducted.   Noted no overlying erythema, induration, or other signs of local infection.   Skin prepped in a sterile fashion.   Local anesthesia: Topical Ethyl  chloride.   With sterile technique and under real time ultrasound guidance:  40 mg of Kenalog and 2 mL of Marcaine injected into joint. Fluid seen entering the joint capsule.   Completed without difficulty   Pain immediately resolved suggesting accurate placement of the medication.   Advised to call if fevers/chills, erythema, induration, drainage, or persistent bleeding.   Images permanently stored and available for review in the ultrasound unit.  Impression: Technically successful ultrasound guided injection.  Procedure: Real-time Ultrasound Guided Injection of left hip greater trochanter bursa Device: Philips Affiniti 50G Images permanently stored and available for review in PACS Verbal informed consent obtained.  Discussed risks and benefits of procedure. Warned about infection bleeding damage to structures skin hypopigmentation and fat atrophy among others. Patient expresses understanding and agreement Time-out conducted.   Noted no overlying erythema, induration, or other signs of local infection.   Skin prepped in a sterile fashion.   Local anesthesia: Topical Ethyl chloride.   With sterile technique and under real time ultrasound guidance:  40 mg of Kenalog and 2 mL of Marcaine injected into bursa. Fluid seen entering the bursa.   Completed without difficulty   Pain immediately resolved suggesting accurate placement of the medication.   Advised to call if fevers/chills, erythema, induration, drainage, or persistent bleeding.   Images permanently stored and available for review in the ultrasound unit.  Impression: Technically successful ultrasound guided injection.      EXAM: RIGHT KNEE - 3 VIEW  COMPARISON:  None.  FINDINGS:  Tricompartmental degenerative changes are noted worst in the medial joint space with bone-on-bone contact. No joint effusion is seen. No acute fracture is noted.  IMPRESSION: Degenerative change without acute abnormality.   Electronically  Signed   By: Joan Russo M.D.   On: 07/30/2020 16:46  I, Joan Russo, personally (independently) visualized and performed the interpretation of the images attached in this note.  X-ray images left hip obtained today personally and independently interpreted No fracture.  Mild hip DJD.  Osteophyte a greater trochanter at hip abductor insertion site. Await formal radiology review   Assessment and Plan: 64 y.o. female with right knee and left lateral hip pain.  Right knee pain thought to be due to DJD.  Plan for steroid injection.  Work on authorization of hyaluronic acid as this may be helpful as well in the future. Recommend also Voltaren gel and quad strengthening.  Weight loss would be helpful.  Refer to physical therapy for gait training and strengthening. We will proceed with steroid injection today.  Left lateral hip pain thought to be due to greater trochanteric bursitis/hip abductor tendinopathy.  Plan for injection as above today as well as physical therapy.  Weight loss should also help.    PDMP not reviewed this encounter. Orders Placed This Encounter  Procedures  . [place] LIMITED JOINT SPACE STRUCTURES LOW RIGHT(NO LINKED CHARGES)    Order Specific Question:   Reason for Exam (SYMPTOM  OR DIAGNOSIS REQUIRED)    Answer:   R knee pain    Order Specific Question:   Preferred imaging location?    Answer:   [facility]  . DG Hip Unilat W OR W/O Pelvis 2-3 Views Left    Standing Status:   Future    Number of Occurrences:   1    Standing Expiration Date:   11/26/2021    Order Specific Question:   Reason for Exam (SYMPTOM  OR DIAGNOSIS REQUIRED)    Answer:   eval lat hip pain    Order Specific Question:   Preferred imaging location?    Answer:   [facility]  . Ambulatory referral to Physical Therapy    Referral Priority:   Routine    Referral Type:   Physical Medicine    Referral Reason:   Specialty Services Required    Requested Specialty:    Physical Therapy    Number of Visits Requested:   1   No orders of the defined types were placed in this encounter.    Discussed warning signs or symptoms. Please see discharge instructions. Patient expresses understanding.   The above documentation has been reviewed and is accurate and complete Joan Russo, M.D.

## 2020-11-26 ENCOUNTER — Ambulatory Visit (INDEPENDENT_AMBULATORY_CARE_PROVIDER_SITE_OTHER): Payer: Medicare Other

## 2020-11-26 ENCOUNTER — Encounter: Payer: Self-pay | Admitting: Family Medicine

## 2020-11-26 ENCOUNTER — Ambulatory Visit (INDEPENDENT_AMBULATORY_CARE_PROVIDER_SITE_OTHER): Payer: Medicare Other | Admitting: Family Medicine

## 2020-11-26 ENCOUNTER — Other Ambulatory Visit: Payer: Self-pay

## 2020-11-26 ENCOUNTER — Telehealth: Payer: Self-pay | Admitting: Family Medicine

## 2020-11-26 ENCOUNTER — Ambulatory Visit: Payer: Self-pay

## 2020-11-26 VITALS — BP 120/70 | HR 75 | Ht <= 58 in | Wt 282.0 lb

## 2020-11-26 DIAGNOSIS — Z9181 History of falling: Secondary | ICD-10-CM

## 2020-11-26 DIAGNOSIS — G8929 Other chronic pain: Secondary | ICD-10-CM | POA: Diagnosis not present

## 2020-11-26 DIAGNOSIS — M25561 Pain in right knee: Secondary | ICD-10-CM

## 2020-11-26 DIAGNOSIS — M25552 Pain in left hip: Secondary | ICD-10-CM | POA: Diagnosis not present

## 2020-11-26 NOTE — Telephone Encounter (Signed)
Pt seen today, stated that Dr. Georgina Snell was going to call in Prednisone ( [facility] [place] ) but this has not been done.

## 2020-11-26 NOTE — Telephone Encounter (Signed)
Called pt and relayed Dr. Clovis Riley message regarding the fact that she had 80mg  of injectable cortisone and that he will no be prescribing an oral steroid too.  She verbalizes understanding.  Advise pt that if she isn't getting relief from the injections after a few days-week, then she is to let us know.

## 2020-11-26 NOTE — Patient Instructions (Addendum)
Thank you for coming in today.  Call or go to the ER if you develop a large red swollen joint with extreme pain or oozing puss.   Please get an Xray today before you leave  Please use voltaren gel up to 4x daily for pain as needed.   We can do the gel shots as soon as they are authorized or if the pain returns.   I've referred you to Physical Therapy.  Let us know if you don't hear from them in one week.   Hip Bursitis  Hip bursitis is swelling of a fluid-filled sac (bursa) in your hip joint. This swelling (inflammation) can be painful. This condition may come and go over time. What are the causes?  Injury to the hip.  Overuse of the muscles that surround the hip joint.  An earlier injury or surgery of the hip.  Arthritis or gout.  Diabetes.  Thyroid disease.  Infection.  In some cases, the cause may not be known. What are the signs or symptoms?  Mild or moderate pain in the hip area. Pain may get worse with movement.  Tenderness and swelling of the hip, especially on the outer side of the hip.  In rare cases, the bursa may become infected. This may cause: ? A fever. ? Warmth and redness in the area. Symptoms may come and go. How is this treated? This condition is treated by resting, icing, applying pressure (compression), and raising (elevating) the injured area. You may hear this called the RICE treatment. Treatment may also include:  Using crutches.  Draining fluid out of the bursa to help relieve swelling.  Giving a shot of (injecting) medicine that helps to reduce swelling (cortisone).  Other medicines if the bursa is infected. Follow these instructions at home: Managing pain, stiffness, and swelling   If told, put ice on the painful area. ? Put ice in a plastic bag. ? Place a towel between your skin and the bag. ? Leave the ice on for 20 minutes, 2-3 times a day. ? Raise (elevate) your hip above the level of your heart as much as you can without  pain. To do this, try putting a pillow under your hips while you lie down. Stop if this causes pain. Activity  Return to your normal activities as told by your doctor. Ask your doctor what activities are safe for you.  Rest and protect your hip as much as you can until you feel better. General instructions  Take over-the-counter and prescription medicines only as told by your doctor.  Wear wraps that put pressure on your hip (compression wraps) only as told by your doctor.  Do not use your hip to support your body weight until your doctor says that you can.  Use crutches as told by your doctor.  Gently rub and stretch your injured area as often as is comfortable.  Keep all follow-up visits as told by your doctor. This is important. How is this prevented?  Exercise regularly, as told by your doctor.  Warm up and stretch before being active.  Cool down and stretch after being active.  Avoid activities that bother your hip or cause pain.  Avoid sitting down for long periods at a time. Contact a doctor if:  You have a fever.  You get new symptoms.  You have trouble walking.  You have trouble doing everyday activities.  You have pain that gets worse.  You have pain that does not get better with medicine.  You get red skin on your hip area.  You get a feeling of warmth in your hip area. Get help right away if:  You cannot move your hip.  You have very bad pain. Summary  Hip bursitis is swelling of a fluid-filled sac (bursa) in your hip.  Hip bursitis can be painful.  Symptoms often come and go over time.  This condition is treated with rest, ice, compression, elevation, and medicines. This information is not intended to replace advice given to you by your health care provider. Make sure you discuss any questions you have with your health care provider. Document Revised: 08/22/2018 Document Reviewed: 08/22/2018 Elsevier Patient Education  [address].

## 2020-11-27 NOTE — Progress Notes (Signed)
X-ray left hip shows some mild to moderate arthritis.  Otherwise looks pretty normal to radiology

## 2020-12-03 ENCOUNTER — Other Ambulatory Visit: Payer: Self-pay | Admitting: Family Medicine

## 2020-12-03 DIAGNOSIS — F3289 Other specified depressive episodes: Secondary | ICD-10-CM

## 2020-12-04 NOTE — Telephone Encounter (Signed)
Pharmacy requests refill on: Sertraline 100 mg   LAST REFILL: 06/05/2020 (Q-90, R-1)  LAST OV: 08/21/2020 NEXT OV: Not Scheduled  PHARMACY: [facility] [place], [place]

## 2020-12-05 ENCOUNTER — Other Ambulatory Visit: Payer: Self-pay | Admitting: Family Medicine

## 2020-12-05 DIAGNOSIS — F339 Major depressive disorder, recurrent, unspecified: Secondary | ICD-10-CM

## 2020-12-09 DIAGNOSIS — Z23 Encounter for immunization: Secondary | ICD-10-CM | POA: Diagnosis not present

## 2020-12-13 ENCOUNTER — Other Ambulatory Visit: Payer: Self-pay | Admitting: Family Medicine

## 2020-12-13 ENCOUNTER — Encounter: Payer: Self-pay | Admitting: Family Medicine

## 2020-12-13 DIAGNOSIS — K219 Gastro-esophageal reflux disease without esophagitis: Secondary | ICD-10-CM

## 2020-12-17 ENCOUNTER — Encounter: Payer: Self-pay | Admitting: Primary Care

## 2020-12-17 ENCOUNTER — Ambulatory Visit (INDEPENDENT_AMBULATORY_CARE_PROVIDER_SITE_OTHER): Payer: Medicare Other | Admitting: Primary Care

## 2020-12-17 ENCOUNTER — Other Ambulatory Visit: Payer: Self-pay

## 2020-12-17 VITALS — BP 126/80 | HR 86 | Temp 97.8°F | Ht <= 58 in | Wt 279.0 lb

## 2020-12-17 DIAGNOSIS — E039 Hypothyroidism, unspecified: Secondary | ICD-10-CM | POA: Diagnosis not present

## 2020-12-17 DIAGNOSIS — F329 Major depressive disorder, single episode, unspecified: Secondary | ICD-10-CM | POA: Diagnosis not present

## 2020-12-17 DIAGNOSIS — R413 Other amnesia: Secondary | ICD-10-CM | POA: Diagnosis not present

## 2020-12-17 DIAGNOSIS — E559 Vitamin D deficiency, unspecified: Secondary | ICD-10-CM | POA: Diagnosis not present

## 2020-12-17 DIAGNOSIS — R42 Dizziness and giddiness: Secondary | ICD-10-CM | POA: Diagnosis not present

## 2020-12-17 DIAGNOSIS — R7303 Prediabetes: Secondary | ICD-10-CM | POA: Diagnosis not present

## 2020-12-17 DIAGNOSIS — I10 Essential (primary) hypertension: Secondary | ICD-10-CM | POA: Diagnosis not present

## 2020-12-17 LAB — CBC
HCT: 44.6 % (ref 36.0–46.0)
Hemoglobin: 14.8 g/dL (ref 12.0–15.0)
MCHC: 33.1 g/dL (ref 30.0–36.0)
MCV: 89.1 fl (ref 78.0–100.0)
Platelets: 295 10*3/uL (ref 150.0–400.0)
RBC: 5.01 Mil/uL (ref 3.87–5.11)
RDW: 14.2 % (ref 11.5–15.5)
WBC: 11.8 10*3/uL — ABNORMAL HIGH (ref 4.0–10.5)

## 2020-12-17 LAB — BASIC METABOLIC PANEL
BUN: 23 mg/dL (ref 6–23)
CO2: 34 mEq/L — ABNORMAL HIGH (ref 19–32)
Calcium: 9.7 mg/dL (ref 8.4–10.5)
Chloride: 102 mEq/L (ref 96–112)
Creatinine, Ser: 1.04 mg/dL (ref 0.40–1.20)
GFR: 55.93 mL/min — ABNORMAL LOW (ref >60.00)
Glucose, Bld: 81 mg/dL (ref 70–99)
Potassium: 4 mEq/L (ref 3.5–5.1)
Sodium: 142 mEq/L (ref 135–145)

## 2020-12-17 LAB — VITAMIN D 25 HYDROXY (VIT D DEFICIENCY, FRACTURES): VITD: 59.27 ng/mL (ref 30.00–100.00)

## 2020-12-17 LAB — TSH: TSH: 3.71 u[IU]/mL (ref 0.35–4.50)

## 2020-12-17 LAB — VITAMIN B12: Vitamin B-12: 410 pg/mL (ref 211–911)

## 2020-12-17 LAB — HEMOGLOBIN A1C: Hgb A1c MFr Bld: 5.8 % (ref 4.6–6.5)

## 2020-12-17 NOTE — Assessment & Plan Note (Signed)
She is taking levothyroxine incorrectly as she is taking with diet Coke and medications and food.  Discussed correct instructions for levothyroxine administration.  Last TSH of 4.99, she has a history of TSH levels trending up and down likely due to improper administration levothyroxine.  I recommended she take her levothyroxine correctly and repeat her thyroid function in 2 months.

## 2020-12-17 NOTE — Assessment & Plan Note (Addendum)
Chronic for years, seems to be experiencing several types of dizziness. Her chronic dizziness seems to represent vertigo, unclear etiology for her balance dizziness.  She is on several medications that could contribute to dizziness, discussed this with her today.  Neuro exam today negative. CT head from December 2020 reviewed, no evidence of prior stroke.  Given normal neuro exam today, do not see the need for repeat CT head.  Checking labs today including TSH, CBC, BMP, vitamin levels, RPR.  Referral placed to neurology for evaluation of memory changes and dizziness.  Also discussed use of Flonase nasal spray for potential inner ear imbalance.

## 2020-12-17 NOTE — Assessment & Plan Note (Signed)
No longer on vitamin D, repeat D level pending.

## 2020-12-17 NOTE — Assessment & Plan Note (Signed)
Denies concerns for depression, does not appear depressed.  Unclear if tearfulness is really an issue, she does not seem bothered by it.  Recommended she take her levothyroxine correctly and repeat TSH in 2 months.  Continue bupropion SR 150 twice daily, sertraline 100 mg daily.

## 2020-12-17 NOTE — Assessment & Plan Note (Signed)
Well-controlled in the office today, not currently on medication regimen for treatment.

## 2020-12-17 NOTE — Assessment & Plan Note (Signed)
Noted prior history, repeat A1c pending.

## 2020-12-17 NOTE — Patient Instructions (Signed)
Stop by the lab prior to leaving today. I will notify you of your results once received.   You will be contacted regarding your referral to neurology for dizziness and memory changes.  Please let us know if you have not been contacted within two weeks.   See your gynecologist as scheduled.  It was a pleasure meeting you!

## 2020-12-17 NOTE — Progress Notes (Signed)
Subjective:    Patient ID: Joan Russo, female    DOB: Aug 22, 1956, 64 y.o.   MRN: [id number]  HPI  This visit occurred during the SARS-CoV-2 public health emergency.  Safety protocols were in place, including screening questions prior to the visit, additional usage of staff PPE, and extensive cleaning of exam room while observing appropriate contact time as indicated for disinfecting solutions.   Joan Russo is a 64 year old female patient of [facility] Netters with a history of hypertension, OSA, hypothyroidism, polyarthralgia who presents today with several issues to discuss.  1) Vaginal Bleeding: Post menopausal since the age of 59, no prior hysterectomy. Vaginal bleeding is light pink and light in amounth, notices when wiping and also in her underwear. Prior history of vaginal bleeding, has had two "d&c procedures previously. She has an appointment with her GYN scheduled for tomorrow.   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   2) Depression: Long history of depression, currently managed on bupropion SR 150 mg twice daily, and sertraline 100 mg daily. She has noticed intermittent episodes of tearfulness without sadness. She doesn't feel sad about anything, her life is great, has no problems. Zoloft was added in 2020 because of her bouts of crying, helped for some time until just recently.   She is managed levothyroxine 50 mcg every morning with her other medications and with diet Coke. Last TSH was 4.99 in June 2021.  Overall she is not bothered by her tearfulness, she just wants to ensure that her tearfulness doesn't progress.   3) Dizziness: Chronic dizziness for years for which she notices if she rolls around in her bed. She will feel the room spinning around her during these episodes. Over the last year she's noticed the need to touch an object as she stands because if not then she'll feel unstable. At times she  will be standing and will notice her body start to sway.   About 3-4 months ago she had two separate incidences where she fell to the ground from a standing position, without dizziness.   She denies changes in medications, chest pain, shortness of breath, family history of dizziness. She's never seen ENT or neuro.  4) Memory Changes: Over the last year she's noticed short and long term memory changes that have become more noticeable to family members recently. She will also forget what she's doing at work at times, forgets where she places things, she will forget old songs that she once knew. She denies feeling anxious or stressed.   She underwent CT head in December 2020 for symptoms of pressure behind her eyes which showed no acute finding but mild atrophy and minor chronic microvascular ischemic changes.   BP Readings from Last 3 Encounters:  12/17/20 126/80  11/26/20 120/70  09/25/20 132/70     Review of Systems  Constitutional: Negative for fever.  Respiratory: Negative for shortness of breath.   Cardiovascular: Negative for chest pain.  Genitourinary: Positive for vaginal bleeding.  Neurological: Positive for dizziness and light-headedness. Negative for speech difficulty and headaches.       Memory changes  Psychiatric/Behavioral: The patient is not nervous/anxious.        Past Medical History:  Diagnosis Date  . Anxiety   . Anxiety and depression   . Depression   . Diverticulosis   . GERD (gastroesophageal reflux disease)   . Internal hemorrhoids   . OA (osteoarthritis)   . Obesity,  Class III, BMI 40-49.9 (morbid obesity) ([place])   . Osteopenia   . Tubular adenoma   . Urinary incontinence   . Vitamin D deficiency      Social History   Socioeconomic History  . Marital status: Widowed    Spouse name: Not on file  . Number of children: 4  . Years of education: Not on file  . Highest education level: Not on file  Occupational History  . Occupation: Gaffer  Tobacco Use  . Smoking status: Never Smoker  . Smokeless tobacco: Never Used  Vaping Use  . Vaping Use: Never used  Substance and Sexual Activity  . Alcohol use: No    Alcohol/week: 0.0 standard drinks  . Drug use: No  . Sexual activity: Not Currently    Birth control/protection: Post-menopausal  Other Topics Concern  . Not on file  Social History Narrative  . Not on file   Social Determinants of Health   Financial Resource Strain: Low Risk   . Difficulty of Paying Living Expenses: Not hard at all  Food Insecurity: No Food Insecurity  . Worried About Charity fundraiser in the Last Year: Never true  . Ran Out of Food in the Last Year: Never true  Transportation Needs: No Transportation Needs  . Lack of Transportation (Medical): No  . Lack of Transportation (Non-Medical): No  Physical Activity: Inactive  . Days of Exercise per Week: 0 days  . Minutes of Exercise per Session: 0 min  Stress: No Stress Concern Present  . Feeling of Stress : Not at all  Social Connections: Not on file  Intimate Partner Violence: Not At Risk  . Fear of Current or Ex-Partner: No  . Emotionally Abused: No  . Physically Abused: No  . Sexually Abused: No    Past Surgical History:  Procedure Laterality Date  . BREAST BIOPSY Right   . COLONOSCOPY WITH PROPOFOL N/A 07/07/2016   Procedure: COLONOSCOPY WITH PROPOFOL;  Surgeon: Jerene Bears, MD;  Location: [facility];  Service: Gastroenterology;  Laterality: N/A;  . ESOPHAGOGASTRODUODENOSCOPY (EGD) WITH PROPOFOL N/A 07/07/2016   Procedure: ESOPHAGOGASTRODUODENOSCOPY (EGD) WITH PROPOFOL;  Surgeon: Jerene Bears, MD;  Location: [facility];  Service: Gastroenterology;  Laterality: N/A;  . HYSTEROSCOPY WITH D & C N/A 01/16/2015   Procedure: DILATATION AND CURETTAGE /HYSTEROSCOPY;  Surgeon: Donnamae Jude, MD;  Location: [facility];  Service: Gynecology;  Laterality: N/A;  . HYSTEROSCOPY WITH D & C N/A 06/06/2019   Procedure: DILATATION AND  CURETTAGE /HYSTEROSCOPY;  Surgeon: Homero Fellers, MD;  Location: [facility];  Service: Gynecology;  Laterality: N/A;  . JOINT REPLACEMENT    . TOTAL KNEE ARTHROPLASTY  08/2008   Left    Family History  Problem Relation Age of Onset  . Cancer Mother 29       breast  . Breast cancer Mother 31  . Cancer Daughter 7       breast  . Breast cancer Daughter 64  . Cancer Father   . Colon cancer Neg Hx   . Esophageal cancer Neg Hx   . Rectal cancer Neg Hx   . Stomach cancer Neg Hx     No Known Allergies  Current Outpatient Medications on File Prior to Visit  Medication Sig Dispense Refill  . ALPRAZolam (XANAX) 0.5 MG tablet Take 1 tablet (0.5 mg total) by mouth at bedtime as needed for anxiety. 20 tablet 0  . buPROPion (WELLBUTRIN SR) 150 MG 12 hr tablet TAKE  1 TABLET BY MOUTH TWICE A DAY 180 tablet 1  . clobetasol cream (TEMOVATE) 7.67 % Apply 1 application topically 2 (two) times daily. X 6 wks, then drop to once daily x 6 wks, then 2x/wk (Patient taking differently: Apply 1 application topically 2 (two) times daily as needed (rash).) 30 g 5  . famotidine (PEPCID) 40 MG tablet Take 1 tablet (40 mg total) by mouth at bedtime. 90 tablet 1  . fluticasone (FLONASE) 50 MCG/ACT nasal spray Place 2 sprays into both nostrils daily. 16 g 6  . ibuprofen (ADVIL) 600 MG tablet Take 1 tablet (600 mg total) by mouth every 6 (six) hours as needed. (Patient taking differently: Take 600 mg by mouth every 6 (six) hours as needed for headache.) 60 tablet 3  . levothyroxine (SYNTHROID) 50 MCG tablet Take 1 tablet (50 mcg total) by mouth daily. **NEEDS OFFICE/VIRTUAL VISIT, NO FURTHER REFILLS 90 tablet 3  . Multiple Vitamin (MULTIVITAMIN WITH MINERALS) TABS tablet Take 1 tablet by mouth daily.    . pregabalin (LYRICA) 75 MG capsule TAKE 1 CAPSULE BY MOUTH TWICE A DAY 60 capsule 5  . sertraline (ZOLOFT) 100 MG tablet TAKE 1 TABLET BY MOUTH EVERY DAY 90 tablet 1   No current facility-administered  medications on file prior to visit.    BP 126/80 (BP Location: Left Arm, Patient Position: Sitting, Cuff Size: Large)   Pulse 86   Temp 97.8 F (36.6 C) (Temporal)   Ht 4\' 10"  (1.473 m)   Wt 279 lb (126.6 kg)   SpO2 95%   BMI 58.31 kg/m    Objective:   Physical Exam Constitutional:      Appearance: She is well-nourished.  HENT:     Right Ear: Tympanic membrane and ear canal normal.     Left Ear: Tympanic membrane and ear canal normal.  Eyes:     Extraocular Movements: Extraocular movements intact.  Cardiovascular:     Rate and Rhythm: Normal rate and regular rhythm.  Pulmonary:     Effort: Pulmonary effort is normal.     Breath sounds: Normal breath sounds.  Musculoskeletal:     Cervical back: Neck supple.  Skin:    General: Skin is warm and dry.  Neurological:     Mental Status: She is alert and oriented to person, place, and time.     Cranial Nerves: No cranial nerve deficit.     Coordination: Romberg sign negative. Coordination normal.  Psychiatric:        Mood and Affect: Mood and affect and mood normal.            Assessment & Plan:

## 2020-12-17 NOTE — Assessment & Plan Note (Signed)
Seems to be chronic, but more noticeable by family recently.  She does not seem to be overly stressed or anxious.  Neuro exam today unremarkable. Checking labs today including TSH, B12, RPR, CBC.  Referral placed to neurology for evaluation. CT head from 2020 reviewed.  No evidence of prior stroke, but she does have mild atrophy and mild chronic microvascular changes.

## 2020-12-18 ENCOUNTER — Other Ambulatory Visit (INDEPENDENT_AMBULATORY_CARE_PROVIDER_SITE_OTHER): Payer: Medicare Other

## 2020-12-18 ENCOUNTER — Ambulatory Visit: Payer: Medicare Other | Admitting: Obstetrics and Gynecology

## 2020-12-18 DIAGNOSIS — D72829 Elevated white blood cell count, unspecified: Secondary | ICD-10-CM | POA: Diagnosis not present

## 2020-12-18 LAB — WHITE CELL DIFFERENTIAL
Band Neutrophils: 0 %
Basophils Relative: 1 % (ref 0.0–3.0)
Eosinophils Relative: 2 % (ref 0.0–5.0)
Lymphocytes Relative: 42.1 % (ref 12.0–46.0)
Monocytes Relative: 6.4 % (ref 3.0–12.0)
Neutrophils Relative %: 48.5 % (ref 43.0–77.0)

## 2020-12-18 LAB — RPR: RPR Ser Ql: NONREACTIVE

## 2020-12-19 ENCOUNTER — Encounter: Payer: Self-pay | Admitting: Obstetrics and Gynecology

## 2020-12-19 ENCOUNTER — Ambulatory Visit (INDEPENDENT_AMBULATORY_CARE_PROVIDER_SITE_OTHER): Payer: Medicare Other | Admitting: Obstetrics and Gynecology

## 2020-12-19 ENCOUNTER — Other Ambulatory Visit: Payer: Self-pay

## 2020-12-19 VITALS — BP 124/74 | Ht 59.0 in | Wt 278.8 lb

## 2020-12-19 DIAGNOSIS — N950 Postmenopausal bleeding: Secondary | ICD-10-CM | POA: Diagnosis not present

## 2020-12-19 DIAGNOSIS — Z1231 Encounter for screening mammogram for malignant neoplasm of breast: Secondary | ICD-10-CM | POA: Diagnosis not present

## 2020-12-19 NOTE — Patient Instructions (Signed)
Postmenopausal Bleeding  Postmenopausal bleeding is any bleeding that a woman has after she has entered into menopause. Menopause is the end of a woman's fertile years. After menopause, a woman no longer ovulates and does not have menstrual periods. Postmenopausal bleeding may have various causes, including:  Menopausal hormone therapy (MHT).  Endometrial atrophy. After menopause, low estrogen hormone levels cause the membrane that lines the uterus (endometrium) to become thinner. You may have bleeding as the endometrium thins.  Endometrial hyperplasia. This condition is caused by excess estrogen hormones and low levels of progesterone hormones. The excess estrogen causes the endometrium to thicken, which can lead to bleeding. In some cases, this can lead to cancer of the uterus.  Endometrial cancer.  Non-cancerous growths (polyps) on the endometrium, the lining of the uterus, or the cervix.  Uterine fibroids. These are non-cancerous growths in or around the uterus muscle tissue that can cause heavy bleeding. Any type of postmenopausal bleeding, even if it appears to be a typical menstrual period, should be evaluated by your health care provider. Treatment will depend on the cause of the bleeding. Follow these instructions at home:  Pay attention to any changes in your symptoms.  Avoid using tampons and douches as told by your health care provider.  Change your pads regularly.  Get regular pelvic exams and Pap tests.  Take iron supplements as told by your health care provider.  Take over-the-counter and prescription medicines only as told by your health care provider.  Keep all follow-up visits as told by your health care provider. This is important. Contact a health care provider if:  Your bleeding lasts more than 1 week.  You have abdominal pain.  You have bleeding with or after sexual intercourse.  You have bleeding that happens more often than every 3 weeks. Get help  right away if:  You have a fever, chills, headache, dizziness, muscle aches, and bleeding.  You have severe pain with bleeding.  You are passing blood clots.  You have heavy bleeding, need more than 1 pad an hour, and have never experienced this before.  You feel faint. Summary  Postmenopausal bleeding is any bleeding that a woman has after she has entered into menopause.  Postmenopausal bleeding may have various causes. Treatment will depend on the cause of the bleeding.  Any type of postmenopausal bleeding, even if it appears to be a typical menstrual period, should be evaluated by your health care provider.  Be sure to pay attention to any changes in your symptoms and keep all follow-up visits as told by your health care provider. This information is not intended to replace advice given to you by your health care provider. Make sure you discuss any questions you have with your health care provider. Document Revised: 03/23/2018 Document Reviewed: 03/09/2017 Elsevier Patient Education  2020 Elsevier Inc.  

## 2020-12-19 NOTE — Progress Notes (Signed)
postmenopasual bleeding

## 2020-12-19 NOTE — Progress Notes (Signed)
Patient ID: Joan Russo, female   DOB: 05/03/1956, 64 y.o.   MRN: [id number]  Reason for Consult: Gynecologic Exam   Referred by Elby Beck, FNP  Subjective:     HPI:  Joan Russo is a 64 y.o. female. She reports new onset bright red vaginal bleeding. This is her first episode of postmenopausal bleeding.    Past Medical History:  Diagnosis Date  . Anxiety   . Anxiety and depression   . Depression   . Diverticulosis   . GERD (gastroesophageal reflux disease)   . Internal hemorrhoids   . OA (osteoarthritis)   . Obesity, Class III, BMI 40-49.9 (morbid obesity) ([place])   . Osteopenia   . Tubular adenoma   . Urinary incontinence   . Vitamin D deficiency    Family History  Problem Relation Age of Onset  . Cancer Mother 45       breast  . Breast cancer Mother 46  . Cancer Daughter 109       breast  . Breast cancer Daughter 15  . Cancer Father   . Colon cancer Neg Hx   . Esophageal cancer Neg Hx   . Rectal cancer Neg Hx   . Stomach cancer Neg Hx    Past Surgical History:  Procedure Laterality Date  . BREAST BIOPSY Right   . COLONOSCOPY WITH PROPOFOL N/A 07/07/2016   Procedure: COLONOSCOPY WITH PROPOFOL;  Surgeon: Jerene Bears, MD;  Location: [facility];  Service: Gastroenterology;  Laterality: N/A;  . ESOPHAGOGASTRODUODENOSCOPY (EGD) WITH PROPOFOL N/A 07/07/2016   Procedure: ESOPHAGOGASTRODUODENOSCOPY (EGD) WITH PROPOFOL;  Surgeon: Jerene Bears, MD;  Location: [facility];  Service: Gastroenterology;  Laterality: N/A;  . HYSTEROSCOPY WITH D & C N/A 01/16/2015   Procedure: DILATATION AND CURETTAGE /HYSTEROSCOPY;  Surgeon: Donnamae Jude, MD;  Location: [facility];  Service: Gynecology;  Laterality: N/A;  . HYSTEROSCOPY WITH D & C N/A 06/06/2019   Procedure: DILATATION AND CURETTAGE /HYSTEROSCOPY;  Surgeon: Homero Fellers, MD;  Location: [facility];  Service: Gynecology;  Laterality: N/A;  . JOINT REPLACEMENT    . TOTAL KNEE ARTHROPLASTY  08/2008   Left     Short Social History:  Social History   Tobacco Use  . Smoking status: Never Smoker  . Smokeless tobacco: Never Used  Substance Use Topics  . Alcohol use: No    Alcohol/week: 0.0 standard drinks    No Known Allergies  Current Outpatient Medications  Medication Sig Dispense Refill  . ALPRAZolam (XANAX) 0.5 MG tablet Take 1 tablet (0.5 mg total) by mouth at bedtime as needed for anxiety. 20 tablet 0  . buPROPion (WELLBUTRIN SR) 150 MG 12 hr tablet TAKE 1 TABLET BY MOUTH TWICE A DAY 180 tablet 1  . famotidine (PEPCID) 40 MG tablet Take 1 tablet (40 mg total) by mouth at bedtime. 90 tablet 1  . fluticasone (FLONASE) 50 MCG/ACT nasal spray Place 2 sprays into both nostrils daily. 16 g 6  . levothyroxine (SYNTHROID) 50 MCG tablet Take 1 tablet (50 mcg total) by mouth daily. **NEEDS OFFICE/VIRTUAL VISIT, NO FURTHER REFILLS 90 tablet 3  . Multiple Vitamin (MULTIVITAMIN WITH MINERALS) TABS tablet Take 1 tablet by mouth daily.    . pregabalin (LYRICA) 75 MG capsule TAKE 1 CAPSULE BY MOUTH TWICE A DAY 60 capsule 5  . sertraline (ZOLOFT) 100 MG tablet TAKE 1 TABLET BY MOUTH EVERY DAY 90 tablet 1  . clobetasol cream (TEMOVATE) [id number] % Apply 1 application topically  2 (two) times daily. X 6 wks, then drop to once daily x 6 wks, then 2x/wk (Patient taking differently: Apply 1 application topically 2 (two) times daily as needed (rash).) 30 g 5  . ibuprofen (ADVIL) 600 MG tablet Take 1 tablet (600 mg total) by mouth every 6 (six) hours as needed. (Patient taking differently: Take 600 mg by mouth every 6 (six) hours as needed for headache.) 60 tablet 3   No current facility-administered medications for this visit.    Review of Systems  Constitutional: Positive for fatigue. Negative for chills, fever and unexpected weight change.  HENT: Negative for trouble swallowing.  Eyes: Negative for loss of vision.  Respiratory: Negative for cough, shortness of breath and wheezing.  Cardiovascular: Negative  for chest pain, leg swelling, palpitations and syncope.  GI: Positive for diarrhea. Negative for abdominal pain, blood in stool, nausea and vomiting.  GU: Negative for difficulty urinating, dysuria, frequency and hematuria.  Musculoskeletal: Positive for joint pain and myalgias. Negative for back pain and leg pain.  Skin: Negative for rash.  Neurological: Negative for dizziness, headaches, light-headedness, numbness and seizures.  Psychiatric: Positive for depressed mood. Negative for behavioral problem, confusion and sleep disturbance.        Objective:  Objective   Vitals:   12/19/20 0957  BP: 124/74  Weight: 278 lb 12.8 oz (126.5 kg)  Height: 4\' 11"  (1.499 m)   Body mass index is 56.31 kg/m.  Physical Exam Vitals and nursing note reviewed.  Constitutional:      Appearance: She is well-developed and well-nourished.  HENT:     Head: Normocephalic and atraumatic.  Eyes:     Extraocular Movements: EOM normal.     Pupils: Pupils are equal, round, and reactive to light.  Cardiovascular:     Rate and Rhythm: Normal rate and regular rhythm.  Pulmonary:     Effort: Pulmonary effort is normal. No respiratory distress.  Genitourinary:    Comments: External: Normal appearing vulva. No lesions noted.  Speculum examination: Difficult and limited pelvic exam in the office. Will need follow up and biopsy in the OR.  Skin:    General: Skin is warm and dry.  Neurological:     Mental Status: She is alert and oriented to person, place, and time.  Psychiatric:        Mood and Affect: Mood and affect normal.        Behavior: Behavior normal.        Thought Content: Thought content normal.        Judgment: Judgment normal.     Assessment/Plan:     64 yo with postmenopausal bleeding. Bleeding bright red since Saturday.  Difficult and limited exam in the office. Would have to be an OR biopsy Follow up in 2 weeks for an [place]  More than 20 minutes were spent face to face with the  patient in the room, reviewing the medical record, labs and images, and coordinating care for the patient. The plan of management was discussed in detail and counseling was provided.      Adrian Prows MD [facility], [facility] 12/19/2020 10:33 AM

## 2021-01-03 ENCOUNTER — Ambulatory Visit: Payer: Medicare Other | Admitting: Obstetrics and Gynecology

## 2021-01-03 ENCOUNTER — Ambulatory Visit: Payer: Medicare Other

## 2021-01-07 ENCOUNTER — Telehealth: Payer: Self-pay

## 2021-01-07 DIAGNOSIS — K219 Gastro-esophageal reflux disease without esophagitis: Secondary | ICD-10-CM

## 2021-01-07 DIAGNOSIS — R0982 Postnasal drip: Secondary | ICD-10-CM

## 2021-01-07 MED ORDER — FLUTICASONE PROPIONATE 50 MCG/ACT NA SUSP
2.0000 | Freq: Every day | NASAL | 6 refills | Status: DC
Start: 2021-01-07 — End: 2023-03-30

## 2021-01-07 MED ORDER — FAMOTIDINE 40 MG PO TABS: 40.0000 mg | ORAL_TABLET | Freq: Every day | ORAL | 1 refills | Status: DC

## 2021-01-07 NOTE — Telephone Encounter (Signed)
Pharmacy requests refill on: Famotidine 40 mg   LAST REFILL: 07/05/2020 (Q-90, R-1) LAST OV: 12/17/2020 NEXT OV: Not Scheduled  PHARMACY: [facility] [place], [place]

## 2021-01-07 NOTE — Telephone Encounter (Signed)
Pharmacy requests refill on: Fluticasone 50 mcg/act  LAST REFILL: 07/05/2020 LAST OV: 12/17/2020 NEXT OV: Not Scheduled  PHARMACY: [facility] [place], [place]

## 2021-01-23 ENCOUNTER — Telehealth: Payer: Self-pay | Admitting: Family Medicine

## 2021-01-23 NOTE — Telephone Encounter (Signed)
Called the patient and she answered and then hung up after several calls. Unable to speak with the patient EM  Needs to schedule TOC Visit.

## 2021-01-27 ENCOUNTER — Ambulatory Visit: Payer: Medicare Other

## 2021-01-27 ENCOUNTER — Ambulatory Visit: Payer: Medicare Other | Admitting: Obstetrics and Gynecology

## 2021-01-31 DIAGNOSIS — R251 Tremor, unspecified: Secondary | ICD-10-CM | POA: Diagnosis not present

## 2021-01-31 DIAGNOSIS — R202 Paresthesia of skin: Secondary | ICD-10-CM | POA: Diagnosis not present

## 2021-01-31 DIAGNOSIS — R42 Dizziness and giddiness: Secondary | ICD-10-CM | POA: Diagnosis not present

## 2021-01-31 DIAGNOSIS — Z8659 Personal history of other mental and behavioral disorders: Secondary | ICD-10-CM | POA: Diagnosis not present

## 2021-01-31 DIAGNOSIS — M792 Neuralgia and neuritis, unspecified: Secondary | ICD-10-CM | POA: Diagnosis not present

## 2021-01-31 DIAGNOSIS — F3289 Other specified depressive episodes: Secondary | ICD-10-CM | POA: Diagnosis not present

## 2021-01-31 DIAGNOSIS — G4733 Obstructive sleep apnea (adult) (pediatric): Secondary | ICD-10-CM | POA: Diagnosis not present

## 2021-01-31 DIAGNOSIS — G245 Blepharospasm: Secondary | ICD-10-CM | POA: Diagnosis not present

## 2021-01-31 DIAGNOSIS — M797 Fibromyalgia: Secondary | ICD-10-CM | POA: Diagnosis not present

## 2021-01-31 DIAGNOSIS — R413 Other amnesia: Secondary | ICD-10-CM | POA: Diagnosis not present

## 2021-01-31 DIAGNOSIS — R200 Anesthesia of skin: Secondary | ICD-10-CM | POA: Diagnosis not present

## 2021-01-31 DIAGNOSIS — R700 Elevated erythrocyte sedimentation rate: Secondary | ICD-10-CM | POA: Diagnosis not present

## 2021-02-01 DIAGNOSIS — Z8659 Personal history of other mental and behavioral disorders: Secondary | ICD-10-CM | POA: Insufficient documentation

## 2021-02-01 DIAGNOSIS — R251 Tremor, unspecified: Secondary | ICD-10-CM | POA: Insufficient documentation

## 2021-02-12 ENCOUNTER — Encounter: Payer: Medicare Other | Admitting: Family Medicine

## 2021-02-12 DIAGNOSIS — Z0289 Encounter for other administrative examinations: Secondary | ICD-10-CM

## 2021-03-19 DIAGNOSIS — H5712 Ocular pain, left eye: Secondary | ICD-10-CM | POA: Diagnosis not present

## 2021-03-19 DIAGNOSIS — M792 Neuralgia and neuritis, unspecified: Secondary | ICD-10-CM | POA: Diagnosis not present

## 2021-03-19 DIAGNOSIS — R519 Headache, unspecified: Secondary | ICD-10-CM | POA: Diagnosis not present

## 2021-03-19 DIAGNOSIS — R251 Tremor, unspecified: Secondary | ICD-10-CM | POA: Diagnosis not present

## 2021-03-19 DIAGNOSIS — R200 Anesthesia of skin: Secondary | ICD-10-CM | POA: Diagnosis not present

## 2021-03-19 DIAGNOSIS — R202 Paresthesia of skin: Secondary | ICD-10-CM | POA: Diagnosis not present

## 2021-03-19 DIAGNOSIS — R42 Dizziness and giddiness: Secondary | ICD-10-CM | POA: Diagnosis not present

## 2021-03-19 DIAGNOSIS — R413 Other amnesia: Secondary | ICD-10-CM | POA: Diagnosis not present

## 2021-03-24 ENCOUNTER — Other Ambulatory Visit: Payer: Self-pay | Admitting: Physician Assistant

## 2021-03-24 DIAGNOSIS — R413 Other amnesia: Secondary | ICD-10-CM

## 2021-03-26 DIAGNOSIS — R519 Headache, unspecified: Secondary | ICD-10-CM | POA: Diagnosis not present

## 2021-03-26 DIAGNOSIS — H5712 Ocular pain, left eye: Secondary | ICD-10-CM | POA: Diagnosis not present

## 2021-04-01 ENCOUNTER — Other Ambulatory Visit: Payer: Self-pay

## 2021-04-01 ENCOUNTER — Encounter: Payer: Self-pay | Admitting: Internal Medicine

## 2021-04-01 ENCOUNTER — Ambulatory Visit (INDEPENDENT_AMBULATORY_CARE_PROVIDER_SITE_OTHER): Payer: Medicare Other | Admitting: Internal Medicine

## 2021-04-01 DIAGNOSIS — F339 Major depressive disorder, recurrent, unspecified: Secondary | ICD-10-CM | POA: Diagnosis not present

## 2021-04-01 DIAGNOSIS — F329 Major depressive disorder, single episode, unspecified: Secondary | ICD-10-CM | POA: Diagnosis not present

## 2021-04-01 NOTE — Progress Notes (Signed)
Subjective:    Patient ID: Joan Russo, female    DOB: 17-Feb-1956, 65 y.o.   MRN: [id number]  HPI  Patient presents to clinic today with complaint of crying spells.  She reports she noticed this after her neurologist adjusted her Sertraline dose because he is trying to figure out what is causing her tremors. She currently takes Sertraline, Wellbutrin and Xanax for mood disorder.  She is not currently seeing a therapist but has seen one in the past and did not feel like this was helpful at all.  She denies anxiety, SI/HI.  Review of Systems      Past Medical History:  Diagnosis Date  . Anxiety   . Anxiety and depression   . Depression   . Diverticulosis   . GERD (gastroesophageal reflux disease)   . Internal hemorrhoids   . OA (osteoarthritis)   . Obesity, Class III, BMI 40-49.9 (morbid obesity) ([place])   . Osteopenia   . Tubular adenoma   . Urinary incontinence   . Vitamin D deficiency     Current Outpatient Medications  Medication Sig Dispense Refill  . ALPRAZolam (XANAX) 0.5 MG tablet Take 1 tablet (0.5 mg total) by mouth at bedtime as needed for anxiety. 20 tablet 0  . buPROPion (WELLBUTRIN SR) 150 MG 12 hr tablet TAKE 1 TABLET BY MOUTH TWICE A DAY 180 tablet 1  . clobetasol cream (TEMOVATE) 0.35 % Apply 1 application topically 2 (two) times daily. X 6 wks, then drop to once daily x 6 wks, then 2x/wk (Patient taking differently: Apply 1 application topically 2 (two) times daily as needed (rash).) 30 g 5  . famotidine (PEPCID) 40 MG tablet Take 1 tablet (40 mg total) by mouth at bedtime. 90 tablet 1  . fluticasone (FLONASE) 50 MCG/ACT nasal spray Place 2 sprays into both nostrils daily. 16 g 6  . ibuprofen (ADVIL) 600 MG tablet Take 1 tablet (600 mg total) by mouth every 6 (six) hours as needed. (Patient taking differently: Take 600 mg by mouth every 6 (six) hours as needed for headache.) 60 tablet 3  . levothyroxine (SYNTHROID) 50 MCG tablet Take 1 tablet (50 mcg total)  by mouth daily. **NEEDS OFFICE/VIRTUAL VISIT, NO FURTHER REFILLS 90 tablet 3  . Multiple Vitamin (MULTIVITAMIN WITH MINERALS) TABS tablet Take 1 tablet by mouth daily.    . pregabalin (LYRICA) 75 MG capsule TAKE 1 CAPSULE BY MOUTH TWICE A DAY 60 capsule 5  . sertraline (ZOLOFT) 100 MG tablet TAKE 1 TABLET BY MOUTH EVERY DAY 90 tablet 1   No current facility-administered medications for this visit.    No Known Allergies  Family History  Problem Relation Age of Onset  . Cancer Mother 41       breast  . Breast cancer Mother 44  . Cancer Daughter 73       breast  . Breast cancer Daughter 64  . Cancer Father   . Colon cancer Neg Hx   . Esophageal cancer Neg Hx   . Rectal cancer Neg Hx   . Stomach cancer Neg Hx     Social History   Socioeconomic History  . Marital status: Widowed    Spouse name: Not on file  . Number of children: 4  . Years of education: Not on file  . Highest education level: Not on file  Occupational History  . Occupation: Armed forces technical officer  Tobacco Use  . Smoking status: Never Smoker  . Smokeless tobacco: Never Used  Vaping  Use  . Vaping Use: Never used  Substance and Sexual Activity  . Alcohol use: No    Alcohol/week: 0.0 standard drinks  . Drug use: No  . Sexual activity: Not Currently    Birth control/protection: Post-menopausal  Other Topics Concern  . Not on file  Social History Narrative  . Not on file   Social Determinants of Health   Financial Resource Strain: Low Risk   . Difficulty of Paying Living Expenses: Not hard at all  Food Insecurity: No Food Insecurity  . Worried About Charity fundraiser in the Last Year: Never true  . Ran Out of Food in the Last Year: Never true  Transportation Needs: No Transportation Needs  . Lack of Transportation (Medical): No  . Lack of Transportation (Non-Medical): No  Physical Activity: Inactive  . Days of Exercise per Week: 0 days  . Minutes of Exercise per Session: 0 min  Stress: No Stress  Concern Present  . Feeling of Stress : Not at all  Social Connections: Not on file  Intimate Partner Violence: Not At Risk  . Fear of Current or Ex-Partner: No  . Emotionally Abused: No  . Physically Abused: No  . Sexually Abused: No     Constitutional: Denies fever, malaise, fatigue, headache or abrupt weight changes.  Respiratory: Denies difficulty breathing, shortness of breath, cough or sputum production.   Cardiovascular: Denies chest pain, chest tightness, palpitations or swelling in the hands or feet.  Psych: Denies anxiety, SI/HI.  No other specific complaints in a complete review of systems (except as listed in HPI above).  Objective:   Physical Exam  BP 124/78   Pulse 75   Temp 98 F (36.7 C) (Temporal)   Wt 280 lb (127 kg)   SpO2 98%   BMI 56.55 kg/m   Wt Readings from Last 3 Encounters:  12/19/20 278 lb 12.8 oz (126.5 kg)  12/17/20 279 lb (126.6 kg)  11/26/20 282 lb (127.9 kg)    General: Appears her stated age, obese, in NAD. Cardiovascular: Normal rate. Pulmonary/Chest: Normal effort. Neurological: Alert and oriented.  Psychiatric: Mood and affect mildly flat. Behavior is normal. Judgment and thought content normal.    BMET    Component Value Date/Time   NA 142 12/17/2020 1257   K 4.0 12/17/2020 1257   CL 102 12/17/2020 1257   CO2 34 (H) 12/17/2020 1257   GLUCOSE 81 12/17/2020 1257   BUN 23 12/17/2020 1257   CREATININE 1.04 12/17/2020 1257   CALCIUM 9.7 12/17/2020 1257   GFRNONAA >60 03/27/2020 1113   GFRAA >60 03/27/2020 1113    Lipid Panel     Component Value Date/Time   CHOL 228 (H) 06/26/2020 0833   TRIG 95.0 06/26/2020 0833   HDL 70.10 06/26/2020 0833   CHOLHDL 3 06/26/2020 0833   VLDL 19.0 06/26/2020 0833   LDLCALC 139 (H) 06/26/2020 0833    CBC    Component Value Date/Time   WBC 11.8 (H) 12/17/2020 1257   RBC 5.01 12/17/2020 1257   HGB 14.8 12/17/2020 1257   HCT 44.6 12/17/2020 1257   PLT 295.0 12/17/2020 1257   MCV  89.1 12/17/2020 1257   MCH 29.9 03/27/2020 1113   MCHC 33.1 12/17/2020 1257   RDW 14.2 12/17/2020 1257   LYMPHSABS 3.4 02/21/2020 1001   MONOABS 0.4 02/21/2020 1001   EOSABS 0.2 02/21/2020 1001   BASOSABS 0.0 02/21/2020 1001    Hgb A1C Lab Results  Component Value Date   HGBA1C  5.8 12/17/2020            Assessment & Plan:    Webb Silversmith, NP This visit occurred during the SARS-CoV-2 public health emergency.  Safety protocols were in place, including screening questions prior to the visit, additional usage of staff PPE, and extensive cleaning of exam room while observing appropriate contact time as indicated for disinfecting solutions.

## 2021-04-03 ENCOUNTER — Other Ambulatory Visit: Payer: Self-pay

## 2021-04-03 ENCOUNTER — Encounter: Payer: Self-pay | Admitting: Internal Medicine

## 2021-04-03 ENCOUNTER — Ambulatory Visit
Admission: RE | Admit: 2021-04-03 | Discharge: 2021-04-03 | Disposition: A | Discharge status: Home / Self Care | Payer: Medicare Other | Source: Ambulatory Visit | Attending: Physician Assistant | Admitting: Physician Assistant

## 2021-04-03 DIAGNOSIS — R413 Other amnesia: Secondary | ICD-10-CM

## 2021-04-03 DIAGNOSIS — R251 Tremor, unspecified: Secondary | ICD-10-CM | POA: Diagnosis not present

## 2021-04-03 MED ORDER — GADOBUTROL 1 MMOL/ML IV SOLN
10.0000 mL | Freq: Once | INTRAVENOUS | Status: AC | PRN
  Administered 2021-04-03: 10 mL via INTRAVENOUS

## 2021-04-03 NOTE — Patient Instructions (Signed)
[email or web address]">  Depression Screening Depression screening is a tool that your health care provider can use to learn if you have symptoms of depression. Depression is a common condition with many symptoms that are also often found in other conditions. Depression is treatable, but it must first be diagnosed. You may not know that certain feelings, thoughts, and behaviors that you are having can be symptoms of depression. Taking a depression screening test can help you and your health care provider decide if you need more assessment, or if you should be referred to a mental health care provider. What are the screening tests?  You may have a physical exam to see if another condition is affecting your mental health. You may have a blood or urine sample taken during the physical exam.  You may be interviewed using a screening tool that was developed from research, such as one of these: ? Patient Health Questionnaire (PHQ). This is a set of either 2 or 9 questions. A health care provider who has been trained to score this screening test uses a guide to assess if your symptoms suggest that you may have depression. ? Hamilton Depression Rating Scale (HAM-D). This is a set of either 17 or 24 questions. You may be asked to take it again during or after your treatment, to see if your depression has gotten better. ? Beck Depression Inventory (BDI). This is a set of 21 multiple choice questions. Your health care provider scores your answers to assess:  Your level of depression, ranging from mild to severe.  Your response to treatment.  Your health care provider may talk with you about your daily activities, such as eating, sleeping, work, and recreation, and ask if you have had any changes in activity.  Your health care provider may ask you to see a mental health specialist, such as a psychiatrist or psychologist, for more  evaluation. Who should be screened for depression?  All adults, including adults with a family history of a mental health disorder.  Adolescents who are 89-77 years old.  People who are recovering from a myocardial infarction (MI).  Pregnant women, or women who have given birth.  People who have a long-term (chronic) illness.  Anyone who has been diagnosed with another type of a mental health disorder.  Anyone who has symptoms that could show depression.   What do my results mean? Your health care provider will review the results of your depression screening, physical exam, and lab tests. Positive screens suggest that you may have depression. Screening is the first step in getting the care that you may need. It is up to you to get your screening results. Ask your health care provider, or the department that is doing your screening tests, when your results will be ready. Talk with your health care provider about your results and diagnosis. A diagnosis of depression is made using the Diagnostic and Statistical Manual of Mental Disorders (DSM-V). This is a book that lists the number and type of symptoms that must be present for a health care provider to give a specific diagnosis.  Your health care provider may work with you to treat your symptoms of depression, or your health care provider may help you find a mental health provider who can assess, diagnose, and treat your depression. Get help right away if:  You have thoughts about hurting yourself or others. If you ever feel like you may hurt yourself or others, or have thoughts about taking your own life, get help  right away. You can go to your nearest emergency department or call:  Your local emergency services (911 in the [place]  A suicide crisis helpline, such as the [facility] at (779)432-9194. This is open 24 hours a day. Summary  Depression screening is the first step in getting the help that you may  need.  If your screening test shows symptoms of depression (is positive), your health care provider may ask you to see a mental health provider.  Anyone who is age 67 or older should be screened for depression. This information is not intended to replace advice given to you by your health care provider. Make sure you discuss any questions you have with your health care provider. Document Revised: 06/06/2020 Document Reviewed: 06/06/2020 Elsevier Patient Education  [employer].

## 2021-04-03 NOTE — Assessment & Plan Note (Signed)
Deteriorated Continue Sertraline at current dose Change Wellbutrin to 1 tab p.o. in a.m., 2 tabs p.o. in p.m. Continue Xanax as needed Support offered She declines referral for therapy at this time

## 2021-04-06 ENCOUNTER — Other Ambulatory Visit: Payer: Self-pay

## 2021-04-06 ENCOUNTER — Ambulatory Visit
Admission: EM | Admit: 2021-04-06 | Discharge: 2021-04-06 | Disposition: A | Discharge status: Home / Self Care | Payer: Medicare Other | Attending: Emergency Medicine | Admitting: Emergency Medicine

## 2021-04-06 DIAGNOSIS — R3989 Other symptoms and signs involving the genitourinary system: Secondary | ICD-10-CM

## 2021-04-06 LAB — POCT URINALYSIS DIP (MANUAL ENTRY)
Bilirubin, UA: NEGATIVE
Blood, UA: NEGATIVE
Glucose, UA: NEGATIVE mg/dL
Ketones, POC UA: NEGATIVE mg/dL
Leukocytes, UA: NEGATIVE
Nitrite, UA: NEGATIVE
Protein Ur, POC: NEGATIVE mg/dL
Spec Grav, UA: 1.025 (ref 1.010–1.025)
Urobilinogen, UA: 0.2 E.U./dL
pH, UA: 5 (ref 5.0–8.0)

## 2021-04-06 NOTE — Discharge Instructions (Signed)
Urine normal Please be sure to drink plenty of fluids Return if changing/worsening

## 2021-04-06 NOTE — ED Provider Notes (Signed)
[facility]    CSN: [id number] Arrival date & time: 04/06/21  1006      History   Chief Complaint Chief Complaint  Patient presents with  . Dysuria    HPI Joan Russo is a 65 y.o. female history of hypertension, hypothyroidism, osteopenia, presenting today for evaluation of possible UTI.  Reports over the past 2 days has noticed her urine has been darker than normal.  Denies any associated dysuria, urinary frequency urgency or incomplete voiding.  Denies any back pain flank pain or lower abdominal pain.  Reports some generalized sensations of fatigue, but denies any fevers nausea or vomiting.  Reports day prior had MRI of brain with contrast.  Denies history of UTIs.  HPI  Past Medical History:  Diagnosis Date  . Anxiety   . Anxiety and depression   . Depression   . Diverticulosis   . GERD (gastroesophageal reflux disease)   . Internal hemorrhoids   . OA (osteoarthritis)   . Obesity, Class III, BMI 40-49.9 (morbid obesity) ([place])   . Osteopenia   . Tubular adenoma   . Urinary incontinence   . Vitamin D deficiency     Patient Active Problem List   Diagnosis Date Noted  . Memory changes 12/17/2020  . Dizziness 12/17/2020  . Prediabetes 12/17/2020  . Insect bite (nonvenomous) of left shoulder, initial encounter 09/05/2020  . Degenerative joint disease of knee, right 03/30/2020  . History of COVID-19 03/12/2020  . Neuropathic pain 12/15/2016  . Hiatal hernia   . Esophageal web   . History of colonic polyps   . Fibromyalgia 12/06/2015  . OSA (obstructive sleep apnea) 02/19/2015  . Elevated C-reactive protein (CRP) 01/31/2015  . Elevated sed rate 01/31/2015  . HTN (hypertension) 01/31/2015  . Hypothyroidism 01/08/2015  . Osteopenia 12/24/2014  . Lichen sclerosus of female genitalia 12/24/2014  . Polyarthralgia 12/01/2011  . Morbid obesity with BMI of 60.0-69.9, adult ([place])   . Depression   . Vitamin D deficiency     Past Surgical History:   Procedure Laterality Date  . BREAST BIOPSY Right   . COLONOSCOPY WITH PROPOFOL N/A 07/07/2016   Procedure: COLONOSCOPY WITH PROPOFOL;  Surgeon: Jerene Bears, MD;  Location: [facility];  Service: Gastroenterology;  Laterality: N/A;  . ESOPHAGOGASTRODUODENOSCOPY (EGD) WITH PROPOFOL N/A 07/07/2016   Procedure: ESOPHAGOGASTRODUODENOSCOPY (EGD) WITH PROPOFOL;  Surgeon: Jerene Bears, MD;  Location: [facility];  Service: Gastroenterology;  Laterality: N/A;  . HYSTEROSCOPY WITH D & C N/A 01/16/2015   Procedure: DILATATION AND CURETTAGE /HYSTEROSCOPY;  Surgeon: Donnamae Jude, MD;  Location: [facility];  Service: Gynecology;  Laterality: N/A;  . HYSTEROSCOPY WITH D & C N/A 06/06/2019   Procedure: DILATATION AND CURETTAGE /HYSTEROSCOPY;  Surgeon: Homero Fellers, MD;  Location: [facility];  Service: Gynecology;  Laterality: N/A;  . JOINT REPLACEMENT    . TOTAL KNEE ARTHROPLASTY  08/2008   Left    OB History    Gravida  2   Para  2   Term  2   Preterm      AB      Living  2     SAB      IAB      Ectopic      Multiple      Live Births  2            Home Medications    Prior to Admission medications   Medication Sig Start Date End Date Taking? Authorizing  Provider  ALPRAZolam Duanne Moron) 0.5 MG tablet Take 1 tablet (0.5 mg total) by mouth at bedtime as needed for anxiety. 07/05/20   Elby Beck, FNP  buPROPion (WELLBUTRIN SR) 150 MG 12 hr tablet Take 1 tab PO in am, 2 tabs PO in pm 04/03/21   Jearld Fenton, NP  clobetasol cream (TEMOVATE) 6.22 % Apply 1 application topically 2 (two) times daily. X 6 wks, then drop to once daily x 6 wks, then 2x/wk Patient taking differently: Apply 1 application topically 2 (two) times daily as needed (rash). 04/21/19   Elby Beck, FNP  famotidine (PEPCID) 40 MG tablet Take 1 tablet (40 mg total) by mouth at bedtime. 01/07/21   Tonia Ghent, MD  fluticasone Asencion Islam) 50 MCG/ACT nasal spray Place 2 sprays into both nostrils daily.  01/07/21   Tonia Ghent, MD  ibuprofen (ADVIL) 600 MG tablet Take 1 tablet (600 mg total) by mouth every 6 (six) hours as needed. Patient taking differently: Take 600 mg by mouth every 6 (six) hours as needed for headache. 06/06/19   Schuman, Stefanie Libel, MD  levothyroxine (SYNTHROID) 50 MCG tablet Take 1 tablet (50 mcg total) by mouth daily. **NEEDS OFFICE/VIRTUAL VISIT, NO FURTHER REFILLS 07/05/20   Elby Beck, FNP  Multiple Vitamin (MULTIVITAMIN WITH MINERALS) TABS tablet Take 1 tablet by mouth daily.    [provider]  pregabalin (LYRICA) 75 MG capsule TAKE 1 CAPSULE BY MOUTH TWICE A DAY 10/28/20   Elby Beck, FNP  sertraline (ZOLOFT) 100 MG tablet TAKE 1 TABLET BY MOUTH EVERY DAY Patient taking differently: Take 25 mg by mouth daily. 12/04/20   Elby Beck, FNP    Family History Family History  Problem Relation Age of Onset  . Cancer Mother 38       breast  . Breast cancer Mother 51  . Cancer Daughter 94       breast  . Breast cancer Daughter 74  . Cancer Father   . Colon cancer Neg Hx   . Esophageal cancer Neg Hx   . Rectal cancer Neg Hx   . Stomach cancer Neg Hx     Social History Social History   Tobacco Use  . Smoking status: Never Smoker  . Smokeless tobacco: Never Used  Vaping Use  . Vaping Use: Never used  Substance Use Topics  . Alcohol use: No    Alcohol/week: 0.0 standard drinks  . Drug use: No     Allergies   Patient has no known allergies.   Review of Systems Review of Systems  Constitutional: Negative for fever.  Respiratory: Negative for shortness of breath.   Cardiovascular: Negative for chest pain.  Gastrointestinal: Negative for abdominal pain, diarrhea, nausea and vomiting.  Genitourinary: Negative for dysuria, flank pain, genital sores, hematuria, menstrual problem, vaginal bleeding, vaginal discharge and vaginal pain.  Musculoskeletal: Negative for back pain.  Skin: Negative for rash.  Neurological: Negative  for dizziness, light-headedness and headaches.     Physical Exam Triage Vital Signs ED Triage Vitals [04/06/21 1035]  Enc Vitals Group     BP (!) 158/93     Pulse Rate 77     Resp 20     Temp 98.1 F (36.7 C)     Temp Source Oral     SpO2 96 %     Weight      Height      Head Circumference      Peak Flow  Pain Score      Pain Loc      Pain Edu?      Excl. in [place]?    No data found.  Updated Vital Signs BP (!) 158/93 (BP Location: Left Arm)   Pulse 77   Temp 98.1 F (36.7 C) (Oral)   Resp 20   SpO2 96%   Visual Acuity Right Eye Distance:   Left Eye Distance:   Bilateral Distance:    Right Eye Near:   Left Eye Near:    Bilateral Near:     Physical Exam Vitals and nursing note reviewed.  Constitutional:      Appearance: She is well-developed.     Comments: No acute distress  HENT:     Head: Normocephalic and atraumatic.     Nose: Nose normal.  Eyes:     Conjunctiva/sclera: Conjunctivae normal.  Cardiovascular:     Rate and Rhythm: Normal rate.  Pulmonary:     Effort: Pulmonary effort is normal. No respiratory distress.  Abdominal:     General: There is no distension.  Musculoskeletal:        General: Normal range of motion.     Cervical back: Neck supple.  Skin:    General: Skin is warm and dry.  Neurological:     Mental Status: She is alert and oriented to person, place, and time.      UC Treatments / Results  Labs (all labs ordered are listed, but only abnormal results are displayed) Labs Reviewed  POCT URINALYSIS DIP (MANUAL ENTRY) - Abnormal; Notable for the following components:      Result Value   Clarity, UA hazy (*)    All other components within normal limits    EKG   Radiology No results found.  Procedures Procedures (including critical care time)  Medications Ordered in UC Medications - No data to display  Initial Impression / Assessment and Plan / UC Course  I have reviewed the triage vital signs and the nursing  notes.  Pertinent labs & imaging results that were available during my care of the patient were reviewed by me and considered in my medical decision making (see chart for details).     UA unremarkable, negative leuks nitrites and hemoglobin, without any pain, encouraging to drink plenty of fluids and close monitoring.  Discussed strict return precautions. Patient verbalized understanding and is agreeable with plan.  Final Clinical Impressions(s) / UC Diagnoses   Final diagnoses:  Abnormal urine color     Discharge Instructions     Urine normal Please be sure to drink plenty of fluids Return if changing/worsening    ED Prescriptions    None     PDMP not reviewed this encounter.   Janith Lima, PA-C 04/06/21 1106

## 2021-04-06 NOTE — ED Triage Notes (Signed)
Pt presents with dysuria for past few days , had MRI with contrast on Thursday, is concerned it is related

## 2021-04-10 ENCOUNTER — Other Ambulatory Visit: Payer: Self-pay

## 2021-04-10 DIAGNOSIS — F32A Depression, unspecified: Secondary | ICD-10-CM

## 2021-04-10 DIAGNOSIS — F419 Anxiety disorder, unspecified: Secondary | ICD-10-CM

## 2021-04-10 MED ORDER — ALPRAZOLAM 0.5 MG PO TABS
0.5000 mg | ORAL_TABLET | Freq: Every evening | ORAL | 0 refills | Status: DC | PRN
Start: 2021-04-10 — End: 2021-09-17

## 2021-04-10 NOTE — Telephone Encounter (Signed)
ERx Previously saw Tor Netters. New PCP listed in her chart - will need to confirm if she's staying with our clinic or going to new PCP.

## 2021-04-10 NOTE — Telephone Encounter (Signed)
Pharmacy requests refill on: Alprazolam 0.5 mg   LAST REFILL: 07/05/2020 (Q-20, R-0) LAST OV: 04/01/2021 NEXT OV: Not Scheduled  PHARMACY: [facility] [place], [place]

## 2021-04-22 ENCOUNTER — Telehealth: Payer: Self-pay | Admitting: Pulmonary Disease

## 2021-04-22 DIAGNOSIS — Z9989 Dependence on other enabling machines and devices: Secondary | ICD-10-CM

## 2021-04-22 DIAGNOSIS — G4733 Obstructive sleep apnea (adult) (pediatric): Secondary | ICD-10-CM

## 2021-04-22 NOTE — Telephone Encounter (Signed)
Called and spoke with pt and she stated that [facility] told her that the rx for the cpap is too old and that they will need a new rx for the cpap and supplies sent in to them.  VS please advise if this is ok to send in.  Thanks  Last ov--09/25/20 Next ov--no pending appts

## 2021-04-23 NOTE — Telephone Encounter (Signed)
Please send script for auto CPAP 8 to 15 cm H2O with heated humidity.

## 2021-04-23 NOTE — Telephone Encounter (Signed)
Called and spoke to pt. New order placed for replacement CPAP. Pt verbalized understanding and denied any further questions or concerns at this time.

## 2021-04-30 DIAGNOSIS — R413 Other amnesia: Secondary | ICD-10-CM | POA: Diagnosis not present

## 2021-05-14 ENCOUNTER — Other Ambulatory Visit: Payer: Self-pay

## 2021-05-14 DIAGNOSIS — M797 Fibromyalgia: Secondary | ICD-10-CM

## 2021-05-14 MED ORDER — PREGABALIN 75 MG PO CAPS: 75.0000 mg | ORAL_CAPSULE | Freq: Two times a day (BID) | ORAL | 5 refills | Status: DC

## 2021-05-14 NOTE — Telephone Encounter (Signed)
Pharmacy requests refill on: Pregabalin 75 mg   LAST REFILL: 10/28/2020 (Q-60, R-5) LAST OV: 04/01/2021 NEXT OV: Not Scheduled  PHARMACY: [facility] [place], [place]

## 2021-05-14 NOTE — Telephone Encounter (Signed)
She is out of the medication and she is going out of town

## 2021-05-15 ENCOUNTER — Ambulatory Visit: Payer: Medicare Other

## 2021-05-15 ENCOUNTER — Other Ambulatory Visit: Payer: Self-pay

## 2021-05-15 DIAGNOSIS — N904 Leukoplakia of vulva: Secondary | ICD-10-CM

## 2021-05-15 MED ORDER — CLOBETASOL PROPIONATE 0.05 % EX CREA: 1.0000 "application " | TOPICAL_CREAM | Freq: Two times a day (BID) | CUTANEOUS | 5 refills | Status: DC

## 2021-05-15 NOTE — Telephone Encounter (Signed)
Pharmacy requests refill on: Clobetasol 0.05% Cream   LAST REFILL: 04/21/2019 (Q-30 g, R-5) LAST OV: 04/01/2021 NEXT OV: Not Scheduled  PHARMACY: [facility] [place], [place]

## 2021-06-02 ENCOUNTER — Other Ambulatory Visit: Payer: Self-pay

## 2021-06-02 DIAGNOSIS — F339 Major depressive disorder, recurrent, unspecified: Secondary | ICD-10-CM

## 2021-06-02 NOTE — Telephone Encounter (Signed)
Last refill: 04/03/21 #270 with 0 refills  Last OV: 03/29/21  No TOC scheduled

## 2021-06-04 ENCOUNTER — Ambulatory Visit (INDEPENDENT_AMBULATORY_CARE_PROVIDER_SITE_OTHER): Payer: Medicare Other

## 2021-06-04 ENCOUNTER — Other Ambulatory Visit: Payer: Self-pay

## 2021-06-04 DIAGNOSIS — Z0000 Encounter for general adult medical examination without abnormal findings: Secondary | ICD-10-CM | POA: Diagnosis not present

## 2021-06-04 NOTE — Patient Instructions (Signed)
Joan Russo , Thank you for taking time to come for your Medicare Wellness Visit. I appreciate your ongoing commitment to your health goals. Please review the following plan we discussed and let me know if I can assist you in the future.   Screening recommendations/referrals: Colonoscopy: Up to date, completed 07/07/2016, due 06/2026 Mammogram: due, will discuss with provider at physical Bone Density: due, will discuss with provider at physical Recommended yearly ophthalmology/optometry visit for glaucoma screening and checkup Recommended yearly dental visit for hygiene and checkup  Vaccinations: Influenza vaccine: Up to date, completed 09/05/2020, due 07/2021 Pneumococcal vaccine: due, will discuss with provider at physical Tdap vaccine: decline-insurance  Shingles vaccine: due, check with your insurance regarding coverage if interested    Covid-19:completed 2 doses, please bring card so we can document dates in your chart  Advanced directives: Please bring a copy of your POA (Power of [place]) and/or Living Will to your next appointment.   Conditions/risks identified: hypertension  Next appointment: Follow up in one year for your annual wellness visit    Preventive Care 21 Years and Older, Female Preventive care refers to lifestyle choices and visits with your health care provider that can promote health and wellness. What does preventive care include?  A yearly physical exam. This is also called an annual well check.  Dental exams once or twice a year.  Routine eye exams. Ask your health care provider how often you should have your eyes checked.  Personal lifestyle choices, including:  Daily care of your teeth and gums.  Regular physical activity.  Eating a healthy diet.  Avoiding tobacco and drug use.  Limiting alcohol use.  Practicing safe sex.  Taking low-dose aspirin every day.  Taking vitamin and mineral supplements as recommended by your health care  provider. What happens during an annual well check? The services and screenings done by your health care provider during your annual well check will depend on your age, overall health, lifestyle risk factors, and family history of disease. Counseling  Your health care provider may ask you questions about your:  Alcohol use.  Tobacco use.  Drug use.  Emotional well-being.  Home and relationship well-being.  Sexual activity.  Eating habits.  History of falls.  Memory and ability to understand (cognition).  Work and work Statistician.  Reproductive health. Screening  You may have the following tests or measurements:  Height, weight, and BMI.  Blood pressure.  Lipid and cholesterol levels. These may be checked every 5 years, or more frequently if you are over 48 years old.  Skin check.  Lung cancer screening. You may have this screening every year starting at age 75 if you have a 30-pack-year history of smoking and currently smoke or have quit within the past 15 years.  Fecal occult blood test (FOBT) of the stool. You may have this test every year starting at age 24.  Flexible sigmoidoscopy or colonoscopy. You may have a sigmoidoscopy every 5 years or a colonoscopy every 10 years starting at age 49.  Hepatitis C blood test.  Hepatitis B blood test.  Sexually transmitted disease (STD) testing.  Diabetes screening. This is done by checking your blood sugar (glucose) after you have not eaten for a while (fasting). You may have this done every 1-3 years.  Bone density scan. This is done to screen for osteoporosis. You may have this done starting at age 8.  Mammogram. This may be done every 1-2 years. Talk to your health care provider about how  often you should have regular mammograms. Talk with your health care provider about your test results, treatment options, and if necessary, the need for more tests. Vaccines  Your health care provider may recommend certain  vaccines, such as:  Influenza vaccine. This is recommended every year.  Tetanus, diphtheria, and acellular pertussis (Tdap, Td) vaccine. You may need a Td booster every 10 years.  Zoster vaccine. You may need this after age 15.  Pneumococcal 13-valent conjugate (PCV13) vaccine. One dose is recommended after age 78.  Pneumococcal polysaccharide (PPSV23) vaccine. One dose is recommended after age 56. Talk to your health care provider about which screenings and vaccines you need and how often you need them. This information is not intended to replace advice given to you by your health care provider. Make sure you discuss any questions you have with your health care provider. Document Released: 01/10/2016 Document Revised: 09/02/2016 Document Reviewed: 10/15/2015 Elsevier Interactive Patient Education  2017 [place] Prevention in the Home Falls can cause injuries. They can happen to people of all ages. There are many things you can do to make your home safe and to help prevent falls. What can I do on the outside of my home?  Regularly fix the edges of walkways and driveways and fix any cracks.  Remove anything that might make you trip as you walk through a door, such as a raised step or threshold.  Trim any bushes or trees on the path to your home.  Use bright outdoor lighting.  Clear any walking paths of anything that might make someone trip, such as rocks or tools.  Regularly check to see if handrails are loose or broken. Make sure that both sides of any steps have handrails.  Any raised decks and porches should have guardrails on the edges.  Have any leaves, snow, or ice cleared regularly.  Use sand or salt on walking paths during winter.  Clean up any spills in your garage right away. This includes oil or grease spills. What can I do in the bathroom?  Use night lights.  Install grab bars by the toilet and in the tub and shower. Do not use towel bars as grab  bars.  Use non-skid mats or decals in the tub or shower.  If you need to sit down in the shower, use a plastic, non-slip stool.  Keep the floor dry. Clean up any water that spills on the floor as soon as it happens.  Remove soap buildup in the tub or shower regularly.  Attach bath mats securely with double-sided non-slip rug tape.  Do not have throw rugs and other things on the floor that can make you trip. What can I do in the bedroom?  Use night lights.  Make sure that you have a light by your bed that is easy to reach.  Do not use any sheets or blankets that are too big for your bed. They should not hang down onto the floor.  Have a firm chair that has side arms. You can use this for support while you get dressed.  Do not have throw rugs and other things on the floor that can make you trip. What can I do in the kitchen?  Clean up any spills right away.  Avoid walking on wet floors.  Keep items that you use a lot in easy-to-reach places.  If you need to reach something above you, use a strong step stool that has a grab bar.  Keep electrical  cords out of the way.  Do not use floor polish or wax that makes floors slippery. If you must use wax, use non-skid floor wax.  Do not have throw rugs and other things on the floor that can make you trip. What can I do with my stairs?  Do not leave any items on the stairs.  Make sure that there are handrails on both sides of the stairs and use them. Fix handrails that are broken or loose. Make sure that handrails are as long as the stairways.  Check any carpeting to make sure that it is firmly attached to the stairs. Fix any carpet that is loose or worn.  Avoid having throw rugs at the top or bottom of the stairs. If you do have throw rugs, attach them to the floor with carpet tape.  Make sure that you have a light switch at the top of the stairs and the bottom of the stairs. If you do not have them, ask someone to add them for  you. What else can I do to help prevent falls?  Wear shoes that:  Do not have high heels.  Have rubber bottoms.  Are comfortable and fit you well.  Are closed at the toe. Do not wear sandals.  If you use a stepladder:  Make sure that it is fully opened. Do not climb a closed stepladder.  Make sure that both sides of the stepladder are locked into place.  Ask someone to hold it for you, if possible.  Clearly mark and make sure that you can see:  Any grab bars or handrails.  First and last steps.  Where the edge of each step is.  Use tools that help you move around (mobility aids) if they are needed. These include:  Canes.  Walkers.  Scooters.  Crutches.  Turn on the lights when you go into a dark area. Replace any light bulbs as soon as they burn out.  Set up your furniture so you have a clear path. Avoid moving your furniture around.  If any of your floors are uneven, fix them.  If there are any pets around you, be aware of where they are.  Review your medicines with your doctor. Some medicines can make you feel dizzy. This can increase your chance of falling. Ask your doctor what other things that you can do to help prevent falls. This information is not intended to replace advice given to you by your health care provider. Make sure you discuss any questions you have with your health care provider. Document Released: 10/10/2009 Document Revised: 05/21/2016 Document Reviewed: 01/18/2015 Elsevier Interactive Patient Education  2017 [employer].

## 2021-06-04 NOTE — Progress Notes (Signed)
PCP notes:  Health Maintenance: Prevnar 13- due shingrix- due covid booster- due Mammogram- due dexa- due    Abnormal Screenings: none   Patient concerns: Dizziness, anxiety, drowsiness, tremors and confusion    Nurse concerns: none   Next PCP appt.: none

## 2021-06-04 NOTE — Progress Notes (Signed)
Subjective:   DEBORAH LAZCANO is a 65 y.o. female who presents for Medicare Annual (Subsequent) preventive examination.  Review of Systems: N/A     I connected with the patient today by telephone and verified that I am speaking with the correct person using two identifiers. Location patient: home Location nurse: work Persons participating in the telephone visit: patient, nurse.   I discussed the limitations, risks, security and privacy concerns of performing an evaluation and management service by telephone and the availability of in person appointments. I also discussed with the patient that there may be a patient responsible charge related to this service. The patient expressed understanding and verbally consented to this telephonic visit.        Cardiac Risk Factors include: advanced age (>5men, >52 women);hypertension     Objective:    Today's Vitals   06/04/21 0755  PainSc: 10-Worst pain ever   There is no height or weight on file to calculate BMI.  Advanced Directives 06/04/2021 05/14/2020 03/27/2020 11/29/2019 06/06/2019 06/01/2019 07/07/2016  Does Patient Have a Medical Advance Directive? Yes Yes No Yes No Yes Yes  Type of Paramedic of [place];Living will [place];Living will - [place]  Does patient want to make changes to medical advance directive? - - - No - Patient declined No - Patient declined No - Patient declined -  Copy of [place] [employer] in Chart? No - copy requested No - copy requested - - - - No - copy requested  Would patient like information on creating a medical advance directive? - - No - Patient declined - No - Patient declined - -    Current Medications (verified) Outpatient Encounter Medications as of 06/04/2021  Medication Sig  . ALPRAZolam (XANAX) 0.5 MG tablet Take 1 tablet (0.5 mg total) by mouth at bedtime as needed for anxiety.  Marland Kitchen buPROPion  (WELLBUTRIN SR) 150 MG 12 hr tablet Take 1 tab PO in am, 2 tabs PO in pm  . clobetasol cream (TEMOVATE) 5.62 % Apply 1 application topically 2 (two) times daily. X 6 wks, then drop to once daily x 6 wks, then 2x/wk  . famotidine (PEPCID) 40 MG tablet Take 1 tablet (40 mg total) by mouth at bedtime.  . fluticasone (FLONASE) 50 MCG/ACT nasal spray Place 2 sprays into both nostrils daily.  Marland Kitchen gabapentin (NEURONTIN) 100 MG capsule Take by mouth.  Marland Kitchen ibuprofen (ADVIL) 600 MG tablet Take 1 tablet (600 mg total) by mouth every 6 (six) hours as needed. (Patient taking differently: Take 600 mg by mouth every 6 (six) hours as needed for headache.)  . levothyroxine (SYNTHROID) 50 MCG tablet Take 1 tablet (50 mcg total) by mouth daily. **NEEDS OFFICE/VIRTUAL VISIT, NO FURTHER REFILLS  . Multiple Vitamin (MULTIVITAMIN WITH MINERALS) TABS tablet Take 1 tablet by mouth daily.  . pregabalin (LYRICA) 75 MG capsule Take 1 capsule (75 mg total) by mouth 2 (two) times daily.  . sertraline (ZOLOFT) 100 MG tablet TAKE 1 TABLET BY MOUTH EVERY DAY (Patient taking differently: Take 25 mg by mouth daily.)  . venlafaxine (EFFEXOR) 37.5 MG tablet Start taking Effexor 37.5 mg daily for one week, then increase to 37.5 mg twice per day for mood and headaches.   No facility-administered encounter medications on file as of 06/04/2021.    Allergies (verified) Patient has no known allergies.   History: Past Medical History:  Diagnosis Date  . Anxiety   .  Anxiety and depression   . Depression   . Diverticulosis   . GERD (gastroesophageal reflux disease)   . Internal hemorrhoids   . OA (osteoarthritis)   . Obesity, Class III, BMI 40-49.9 (morbid obesity) ([place])   . Osteopenia   . Tubular adenoma   . Urinary incontinence   . Vitamin D deficiency    Past Surgical History:  Procedure Laterality Date  . BREAST BIOPSY Right   . COLONOSCOPY WITH PROPOFOL N/A 07/07/2016   Procedure: COLONOSCOPY WITH PROPOFOL;  Surgeon: Jerene Bears, MD;  Location: [facility];  Service: Gastroenterology;  Laterality: N/A;  . ESOPHAGOGASTRODUODENOSCOPY (EGD) WITH PROPOFOL N/A 07/07/2016   Procedure: ESOPHAGOGASTRODUODENOSCOPY (EGD) WITH PROPOFOL;  Surgeon: Jerene Bears, MD;  Location: [facility];  Service: Gastroenterology;  Laterality: N/A;  . HYSTEROSCOPY WITH D & C N/A 01/16/2015   Procedure: DILATATION AND CURETTAGE /HYSTEROSCOPY;  Surgeon: Donnamae Jude, MD;  Location: [facility];  Service: Gynecology;  Laterality: N/A;  . HYSTEROSCOPY WITH D & C N/A 06/06/2019   Procedure: DILATATION AND CURETTAGE /HYSTEROSCOPY;  Surgeon: Homero Fellers, MD;  Location: [facility];  Service: Gynecology;  Laterality: N/A;  . JOINT REPLACEMENT    . TOTAL KNEE ARTHROPLASTY  08/2008   Left   Family History  Problem Relation Age of Onset  . Cancer Mother 12       breast  . Breast cancer Mother 75  . Cancer Daughter 76       breast  . Breast cancer Daughter 36  . Cancer Father   . Colon cancer Neg Hx   . Esophageal cancer Neg Hx   . Rectal cancer Neg Hx   . Stomach cancer Neg Hx    Social History   Socioeconomic History  . Marital status: Widowed    Spouse name: Not on file  . Number of children: 4  . Years of education: Not on file  . Highest education level: Not on file  Occupational History  . Occupation: Armed forces technical officer  Tobacco Use  . Smoking status: Never Smoker  . Smokeless tobacco: Never Used  Vaping Use  . Vaping Use: Never used  Substance and Sexual Activity  . Alcohol use: No    Alcohol/week: 0.0 standard drinks  . Drug use: No  . Sexual activity: Not Currently    Birth control/protection: Post-menopausal  Other Topics Concern  . Not on file  Social History Narrative  . Not on file   Social Determinants of Health   Financial Resource Strain: Low Risk   . Difficulty of Paying Living Expenses: Not hard at all  Food Insecurity: No Food Insecurity  . Worried About Charity fundraiser in the Last Year:  Never true  . Ran Out of Food in the Last Year: Never true  Transportation Needs: No Transportation Needs  . Lack of Transportation (Medical): No  . Lack of Transportation (Non-Medical): No  Physical Activity: Inactive  . Days of Exercise per Week: 0 days  . Minutes of Exercise per Session: 0 min  Stress: Stress Concern Present  . Feeling of Stress : To some extent  Social Connections: Not on file    Tobacco Counseling Counseling given: Not Answered   Clinical Intake:  Pre-visit preparation completed: Yes  Pain : 0-10 Pain Score: 10-Worst pain ever Pain Type: Chronic pain Pain Location: Knee Pain Orientation: Right Pain Descriptors / Indicators: Aching Pain Onset: More than a month ago Pain Frequency: Intermittent     Nutritional Risks:  None Diabetes: No  How often do you need to have someone help you when you read instructions, pamphlets, or other written materials from your doctor or pharmacy?: 1 - Never What is the last grade level you completed in school?: some college  Diabetic: No Nutrition Risk Assessment:  Has the patient had any N/V/D within the last 2 months?  No  Does the patient have any non-healing wounds?  No  Has the patient had any unintentional weight loss or weight gain?  No   Diabetes:  Is the patient diabetic?  No  If diabetic, was a CBG obtained today?  N/A Did the patient bring in their glucometer from home?  N/A How often do you monitor your CBG's? N/A.   Financial Strains and Diabetes Management:  Are you having any financial strains with the device, your supplies or your medication? N/A.  Does the patient want to be seen by Chronic Care Management for management of their diabetes?  N/A Would the patient like to be referred to a Nutritionist or for Diabetic Management?  N/A  Interpreter Needed?: No  Information entered by :: CJohnson, LPN   Activities of Daily Living In your present state of health, do you have any difficulty  performing the following activities: 06/04/2021  Hearing? N  Vision? N  Difficulty concentrating or making decisions? N  Walking or climbing stairs? N  Dressing or bathing? N  Doing errands, shopping? N  Preparing Food and eating ? N  Using the Toilet? N  In the past six months, have you accidently leaked urine? Y  Comment wears pads  Do you have problems with loss of bowel control? N  Managing your Medications? N  Managing your Finances? N  Housekeeping or managing your Housekeeping? N  Some recent data might be hidden    Patient Care Team: Erven Colla as PCP - General (Physician Assistant)  Indicate any recent Medical Services you may have received from other than [facility] providers in the past year (date may be approximate).     Assessment:   This is a routine wellness examination for [place].  Hearing/Vision screen  Hearing Screening   125Hz  250Hz  500Hz  1000Hz  2000Hz  3000Hz  4000Hz  6000Hz  8000Hz   Right ear:           Left ear:           Vision Screening Comments: Patient gets annual eye exams   Dietary issues and exercise activities discussed: Current Exercise Habits: The patient does not participate in regular exercise at present, Exercise limited by: orthopedic condition(s) (knee pain)  Goals Addressed            This Visit's Progress   . Patient Stated       06/04/2021, I will maintain and continue medications as prescribed.       Depression Screen PHQ 2/9 Scores 06/04/2021 05/14/2020 03/12/2020 11/21/2018 10/17/2018 08/26/2018 06/08/2016  PHQ - 2 Score 0 0 5 2 6  0 3  PHQ- 9 Score 0 0 12 7 18  - 11    Fall Risk Fall Risk  06/04/2021 05/14/2020  Falls in the past year? 1 1  Comment - dizzy spells  Number falls in past yr: 1 1  Injury with Fall? 0 0  Risk for fall due to : Medication side effect;Impaired balance/gait Impaired balance/gait;Medication side effect  Follow up Falls evaluation completed;Falls prevention discussed Falls evaluation completed;Falls  prevention discussed    FALL RISK PREVENTION PERTAINING TO THE HOME:  Any stairs in  or around the home? Yes  If so, are there any without handrails? No  Home free of loose throw rugs in walkways, pet beds, electrical cords, etc? Yes  Adequate lighting in your home to reduce risk of falls? Yes   ASSISTIVE DEVICES UTILIZED TO PREVENT FALLS:  Life alert? No  Use of a cane, walker or w/c? No  Grab bars in the bathroom? No  Shower chair or bench in shower? No  Elevated toilet seat or a handicapped toilet? No   TIMED UP AND GO:  Was the test performed? N/A telephone visit .  Cognitive Function: MMSE - Mini Mental State Exam 06/04/2021 05/14/2020  Orientation to time 5 5  Orientation to Place 5 5  Registration 3 3  Attention/ Calculation 5 5  Recall 3 3  Language- repeat 1 1  Mini Cog  Mini-Cog screen was completed. Maximum score is 22. A value of 0 denotes this part of the MMSE was not completed or the patient failed this part of the Mini-Cog screening.       Immunizations Immunization History  Administered Date(s) Administered  . Fluad Quad(high Dose 65+) 08/29/2019, 09/05/2020  . Influenza Split 10/13/2011  . Influenza,inj,Quad PF,6+ Mos 09/19/2014, 10/14/2015, 08/28/2017, 10/17/2018  . PFIZER(Purple Top)SARS-COV-2 Vaccination 12/09/2020    TDAP status: Due, Education has been provided regarding the importance of this vaccine. Advised may receive this vaccine at local pharmacy or Health Dept. Aware to provide a copy of the vaccination record if obtained from local pharmacy or Health Dept. Verbalized acceptance and understanding.  Flu Vaccine status: Up to date  Pneumococcal vaccine status: Due, Education has been provided regarding the importance of this vaccine. Advised may receive this vaccine at local pharmacy or Health Dept. Aware to provide a copy of the vaccination record if obtained from local pharmacy or Health Dept. Verbalized acceptance and  understanding.  Covid-19 vaccine status: completed 2 doses per patient   Qualifies for Shingles Vaccine? Yes   Zostavax completed No   Shingrix Completed?: No.    Education has been provided regarding the importance of this vaccine. Patient has been advised to call insurance company to determine out of pocket expense if they have not yet received this vaccine. Advised may also receive vaccine at local pharmacy or Health Dept. Verbalized acceptance and understanding.  Screening Tests Health Maintenance  Topic Date Due  . Hepatitis C Screening  Never done  . Zoster Vaccines- Shingrix (1 of 2) Never done  . DEXA SCAN  04/08/2019  . PNA vac Low Risk Adult (1 of 2 - PCV13) Never done  . COVID-19 Vaccine (2 - Pfizer 3-dose series) 12/30/2020  . TETANUS/TDAP  05/14/2024 (Originally 10/13/2019)  . INFLUENZA VACCINE  07/28/2021  . MAMMOGRAM  05/10/2022  . COLONOSCOPY (Pts 45-62yrs Insurance coverage will need to be confirmed)  07/07/2026  . Pneumococcal Vaccine 31-62 Years old  Aged Out  . HPV VACCINES  Aged Out    Health Maintenance  Health Maintenance Due  Topic Date Due  . Hepatitis C Screening  Never done  . Zoster Vaccines- Shingrix (1 of 2) Never done  . DEXA SCAN  04/08/2019  . PNA vac Low Risk Adult (1 of 2 - PCV13) Never done  . COVID-19 Vaccine (2 - Pfizer 3-dose series) 12/30/2020    Colorectal cancer screening: Type of screening: Colonoscopy. Completed 07/07/2016. Repeat every 10 years  Mammogram status: due, will discuss with provider at physical   Bone Density status: due, will discuss with provider  at physical  Lung Cancer Screening: (Low Dose CT Chest recommended if Age 66-80 years, 30 pack-year currently smoking OR have quit w/in 15years.) does not qualify.   Additional Screening:  Hepatitis C Screening: does qualify; Completed due  Vision Screening: Recommended annual ophthalmology exams for early detection of glaucoma and other disorders of the eye. Is the  patient up to date with their annual eye exam?  Yes  Who is the provider or what is the name of the office in which the patient attends annual eye exams? [facility]  If pt is not established with a provider, would they like to be referred to a provider to establish care? No .   Dental Screening: Recommended annual dental exams for proper oral hygiene  Community Resource Referral / Chronic Care Management: CRR required this visit?  No   CCM required this visit?  No      Plan:     I have personally reviewed and noted the following in the patient's chart:   . Medical and social history . Use of alcohol, tobacco or illicit drugs  . Current medications and supplements including opioid prescriptions.  . Functional ability and status . Nutritional status . Physical activity . Advanced directives . List of other physicians . Hospitalizations, surgeries, and ER visits in previous 12 months . Vitals . Screenings to include cognitive, depression, and falls . Referrals and appointments  In addition, I have reviewed and discussed with patient certain preventive protocols, quality metrics, and best practice recommendations. A written personalized care plan for preventive services as well as general preventive health recommendations were provided to patient.   Due to this being a telephonic visit, the after visit summary with patients personalized plan was offered to patient via office or my-chart. Patient preferred to pick up at office at next visit or via mychart.   Andrez Grime, LPN   03/04/3427

## 2021-06-12 MED ORDER — BUPROPION HCL ER (SR) 150 MG PO TB12: ORAL_TABLET | ORAL | 0 refills | Status: DC

## 2021-06-12 NOTE — Telephone Encounter (Signed)
Changed to a 30 day supply.  Please sign refill request.

## 2021-06-12 NOTE — Telephone Encounter (Signed)
Last refill I see in chart in 04/03/21 for #[phone] with no refills.

## 2021-07-11 ENCOUNTER — Other Ambulatory Visit: Payer: Self-pay | Admitting: Family

## 2021-07-11 DIAGNOSIS — F339 Major depressive disorder, recurrent, unspecified: Secondary | ICD-10-CM

## 2021-07-16 ENCOUNTER — Encounter: Payer: Self-pay | Admitting: Family Medicine

## 2021-07-16 ENCOUNTER — Ambulatory Visit: Payer: Self-pay

## 2021-07-16 ENCOUNTER — Ambulatory Visit (INDEPENDENT_AMBULATORY_CARE_PROVIDER_SITE_OTHER): Payer: Medicare Other | Admitting: Family Medicine

## 2021-07-16 ENCOUNTER — Other Ambulatory Visit: Payer: Self-pay

## 2021-07-16 VITALS — BP 120/76 | Ht 59.0 in | Wt 278.0 lb

## 2021-07-16 DIAGNOSIS — M25561 Pain in right knee: Secondary | ICD-10-CM | POA: Diagnosis not present

## 2021-07-16 DIAGNOSIS — M1711 Unilateral primary osteoarthritis, right knee: Secondary | ICD-10-CM | POA: Diagnosis not present

## 2021-07-16 DIAGNOSIS — G8929 Other chronic pain: Secondary | ICD-10-CM

## 2021-07-16 DIAGNOSIS — Z6841 Body Mass Index (BMI) 40.0 and over, adult: Secondary | ICD-10-CM

## 2021-07-16 NOTE — Patient Instructions (Addendum)
Good to see you today.   You had a R knee injection.  Call or go to the ER if you develop a large red swollen joint with extreme pain or oozing puss.   I have referred you to bariatric surgery.  You should hear soon.  Let me know if you have a problem.   I will let your primacy care provider know.   I can do this shot every 3 months.

## 2021-07-16 NOTE — Progress Notes (Signed)
I, Wendy Poet, LAT, ATC, am serving as scribe for Dr. [name].  Joan Russo is a 65 y.o. female who presents to [place] [facility] today for continued chronic R knee pain. Pt was last seen by Dr. Georgina Snell on 11/26/20 and was given a R knee and L GT steroid injections. To treat R knee pain, pt was advised to use Voltaren gel, work on quad strengthening, weight loss, and was referred to [place] [facility]. Today, pt reports that her R knee pain has been flared up for about one month w/ no new MOI.  She reports R knee swelling.  Walking is her main aggravating activity and she is walking w/ a standard cane.  Dx imaging: 07/30/20 R knee XR  Pertinent review of systems: No fever or chills  Relevant historical information: Hypertension.  Morbid obesity   Exam:  BP 120/76 (BP Location: Left Arm, Patient Position: Sitting, Cuff Size: Large)   Ht 4\' 11"  (1.499 m)   Wt 278 lb (126.1 kg)   SpO2 95%   BMI 56.15 kg/m  General: Well Developed, obese and in no acute distress.   MSK: Right knee moderate effusion.  Normal motion.  Crepitation with range of motion.  Tender palpation medial joint line.    Lab and Radiology Results  Procedure: Real-time Ultrasound Guided Injection of right knee superior lateral patellar space Device: Philips Affiniti 50G Images permanently stored and available for review in PACS Verbal informed consent obtained.  Discussed risks and benefits of procedure. Warned about infection bleeding damage to structures skin hypopigmentation and fat atrophy among others. Patient expresses understanding and agreement Time-out conducted.   Noted no overlying erythema, induration, or other signs of local infection.   Skin prepped in a sterile fashion.   Local anesthesia: Topical Ethyl chloride.   With sterile technique and under real time ultrasound guidance: 40 mg of Kenalog and 2 mL of Marcaine injected into the joint. Fluid seen entering the joint  capsule.   Completed without difficulty   Pain immediately resolved suggesting accurate placement of the medication.   Advised to call if fevers/chills, erythema, induration, drainage, or persistent bleeding.   Images permanently stored and available for review in the ultrasound unit.  Impression: Technically successful ultrasound guided injection.     EXAM: RIGHT KNEE - 3 VIEW   COMPARISON:  None.   FINDINGS: Tricompartmental degenerative changes are noted worst in the medial joint space with bone-on-bone contact. No joint effusion is seen. No acute fracture is noted.   IMPRESSION: Degenerative change without acute abnormality.     Electronically Signed   By: Inez Catalina M.D.   On: 07/30/2020 16:46 I, [name], personally (independently) visualized and performed the interpretation of the images attached in this note.     Assessment and Plan: 65 y.o. female with right knee pain due to exacerbation of DJD.  Plan for steroid injection.  Last injection was about 8 months ago.  Certainly if the shots or not lasting very long could proceed to hyaluronic acid injections as well.  However these were all short to medium term solutions to her knee pain.  Ultimately it is very likely that she will require knee replacement however her BMI is currently 56 and she is not eligible for knee replacement.  She will need to lose a considerable amount of weight to get her BMI under 40 to have a knee replacement.  We spent some time talking about weight loss strategies.  After discussion we both think that probably her best option is consultation with bariatric surgery to discuss possibility of surgical options including gastric sleeve.  Additionally discussed medical management strategies and she certainly could proceed that as well.  Would recommend [place] healthy weight and wellness.   PDMP not reviewed this encounter. Orders Placed This Encounter  Procedures   [place] LIMITED JOINT SPACE  STRUCTURES LOW RIGHT(NO LINKED CHARGES)    Order Specific Question:   Reason for Exam (SYMPTOM  OR DIAGNOSIS REQUIRED)    Answer:   R knee pain    Order Specific Question:   Preferred imaging location?    Answer:   [place]   Ambulatory referral to General Surgery    Referral Priority:   Routine    Referral Type:   Surgical    Referral Reason:   Specialty Services Required    Requested Specialty:   General Surgery    Number of Visits Requested:   1   No orders of the defined types were placed in this encounter.    Discussed warning signs or symptoms. Please see discharge instructions. Patient expresses understanding.   The above documentation has been reviewed and is accurate and complete [name], M.D.

## 2021-07-23 DIAGNOSIS — R2689 Other abnormalities of gait and mobility: Secondary | ICD-10-CM | POA: Diagnosis not present

## 2021-07-23 DIAGNOSIS — M4802 Spinal stenosis, cervical region: Secondary | ICD-10-CM | POA: Diagnosis not present

## 2021-08-19 ENCOUNTER — Encounter: Payer: Self-pay | Admitting: Internal Medicine

## 2021-09-03 DIAGNOSIS — H18592 Other hereditary corneal dystrophies, left eye: Secondary | ICD-10-CM | POA: Diagnosis not present

## 2021-09-03 DIAGNOSIS — H524 Presbyopia: Secondary | ICD-10-CM | POA: Diagnosis not present

## 2021-09-03 DIAGNOSIS — H18602 Keratoconus, unspecified, left eye: Secondary | ICD-10-CM | POA: Diagnosis not present

## 2021-09-03 DIAGNOSIS — H26492 Other secondary cataract, left eye: Secondary | ICD-10-CM | POA: Diagnosis not present

## 2021-09-11 DIAGNOSIS — R413 Other amnesia: Secondary | ICD-10-CM | POA: Diagnosis not present

## 2021-09-17 ENCOUNTER — Other Ambulatory Visit: Payer: Self-pay

## 2021-09-17 ENCOUNTER — Ambulatory Visit (INDEPENDENT_AMBULATORY_CARE_PROVIDER_SITE_OTHER): Payer: Medicare Other | Admitting: Nurse Practitioner

## 2021-09-17 VITALS — BP 142/74 | HR 73 | Temp 98.0°F | Resp 16 | Ht <= 58 in | Wt 277.5 lb

## 2021-09-17 DIAGNOSIS — F419 Anxiety disorder, unspecified: Secondary | ICD-10-CM | POA: Diagnosis not present

## 2021-09-17 DIAGNOSIS — Z6841 Body Mass Index (BMI) 40.0 and over, adult: Secondary | ICD-10-CM | POA: Diagnosis not present

## 2021-09-17 DIAGNOSIS — G4733 Obstructive sleep apnea (adult) (pediatric): Secondary | ICD-10-CM

## 2021-09-17 DIAGNOSIS — R413 Other amnesia: Secondary | ICD-10-CM

## 2021-09-17 DIAGNOSIS — F322 Major depressive disorder, single episode, severe without psychotic features: Secondary | ICD-10-CM | POA: Diagnosis not present

## 2021-09-17 DIAGNOSIS — M1711 Unilateral primary osteoarthritis, right knee: Secondary | ICD-10-CM

## 2021-09-17 DIAGNOSIS — Z23 Encounter for immunization: Secondary | ICD-10-CM

## 2021-09-17 DIAGNOSIS — E039 Hypothyroidism, unspecified: Secondary | ICD-10-CM

## 2021-09-17 DIAGNOSIS — I10 Essential (primary) hypertension: Secondary | ICD-10-CM | POA: Diagnosis not present

## 2021-09-17 DIAGNOSIS — F32A Depression, unspecified: Secondary | ICD-10-CM | POA: Diagnosis not present

## 2021-09-17 LAB — COMPREHENSIVE METABOLIC PANEL
ALT: 19 U/L (ref 0–35)
AST: 21 U/L (ref 0–37)
Albumin: 4.1 g/dL (ref 3.5–5.2)
Alkaline Phosphatase: 75 U/L (ref 39–117)
BUN: 14 mg/dL (ref 6–23)
CO2: 30 mEq/L (ref 19–32)
Calcium: 9.6 mg/dL (ref 8.4–10.5)
Chloride: 106 mEq/L (ref 96–112)
Creatinine, Ser: 0.92 mg/dL (ref 0.40–1.20)
GFR: 64.46 mL/min (ref >60.00)
Glucose, Bld: 89 mg/dL (ref 70–99)
Potassium: 4.3 mEq/L (ref 3.5–5.1)
Sodium: 144 mEq/L (ref 135–145)
Total Bilirubin: 0.5 mg/dL (ref 0.2–1.2)
Total Protein: 6.8 g/dL (ref 6.0–8.3)

## 2021-09-17 LAB — TSH: TSH: 4.05 u[IU]/mL (ref 0.35–5.50)

## 2021-09-17 LAB — CBC
HCT: 43.6 % (ref 36.0–46.0)
Hemoglobin: 14.2 g/dL (ref 12.0–15.0)
MCHC: 32.5 g/dL (ref 30.0–36.0)
MCV: 90.6 fl (ref 78.0–100.0)
Platelets: 265 10*3/uL (ref 150.0–400.0)
RBC: 4.81 Mil/uL (ref 3.87–5.11)
RDW: 14.8 % (ref 11.5–15.5)
WBC: 9.6 10*3/uL (ref 4.0–10.5)

## 2021-09-17 LAB — VITAMIN B12: Vitamin B-12: 472 pg/mL (ref 211–911)

## 2021-09-17 MED ORDER — BUPROPION HCL ER (SR) 150 MG PO TB12: ORAL_TABLET | ORAL | 1 refills | Status: DC

## 2021-09-17 MED ORDER — ALPRAZOLAM 0.5 MG PO TABS: 0.5000 mg | ORAL_TABLET | Freq: Every evening | ORAL | 0 refills | Status: DC | PRN

## 2021-09-17 MED ORDER — LEVOTHYROXINE SODIUM 50 MCG PO TABS: 50.0000 ug | ORAL_TABLET | Freq: Every day | ORAL | 3 refills | Status: DC

## 2021-09-17 NOTE — Assessment & Plan Note (Signed)
Blood pressures in office in past few visits within normal limits.  Patient not on any antihypertensive medications currently.  Continue current regimen.

## 2021-09-17 NOTE — Assessment & Plan Note (Signed)
Complaint of patient's.  Did see neurology note they are managing it.  Will refer to neurology has been approximate 9 months since B12 check, looking at last 3 numbers it is trending downward we will recheck B12 today.  Pending results

## 2021-09-17 NOTE — Assessment & Plan Note (Signed)
Patient currently morbidly obese.  We did discuss briefly her eating habits.  States she does not eat much today but does like sweet cakes and snacks.  We did discuss that she does need to eat 2-3 meals daily with no snacking.  Limiting her sugar intake inclusive of cakes, candy, sodas, juice.  Patient acknowledged given patient's habitus and orthopedic impairments unable to exercise traditionally.  Did review using computer resources such as YouTube or to do seated exercises with without resistance bands or weights.  She also discussed going to the [facility].  Told her if her [employer] had a pool aqua therapy would be of great benefit as it helps decompress some of the joints.  Patient will take the signs in the consideration.

## 2021-09-17 NOTE — Patient Instructions (Signed)
Nice to see you Will follow up in a few months to check on mood

## 2021-09-17 NOTE — Assessment & Plan Note (Signed)
Patient has history of left knee replacement and dealing with right knee pain currently.  Is followed by orthopedics.  Per patient report she could benefit from a joint replacement but due to patient's BMI and comorbidities currently not able to have procedure.  Continue following with orthopedist and working on weight loss.

## 2021-09-17 NOTE — Progress Notes (Signed)
Established Patient Office Visit  Subjective:  Patient ID: Joan Russo, female    DOB: [birth year]  Age: 65 y.o. MRN: [id number]  CC:  Chief Complaint  Patient presents with   Transfer of Care   Medication Refill   handicap form    Due to left knee pain, SOB, has fibromyalgia   Memory Loss    Has issues with short and long term memory. Would like to discuss management for this-she had to quit her job due to this.    HPI Joan VERBEKE presents for [facility]  Anxiety and depression: have been under care of psych and therapy many years. Not opposed to trying therapy.  HTN: Not sure if she truly has dx. Cannot remember being on blood pressure medications. BP has been well maintained without pharmacological intervention  OSA: CPAP that she wears. Pulmonolgy set that up for her. Sees them 6 months to a year.  DJD: Chronic Right knee pain that is being managed by Ortho. States she needs a replacement but will not do it because of her BMI currently.Marland Kitchen Hx of left knee replacement. Does walk with a cane everywhere.   Hypothyroidism: States that she tolerates medication well. States she has been out of her medicaiton for approx 3 months. No symptoms to complain of currently  Obesity: no particular diet. No exercises. Looking at the [facility].   Past Medical History:  Diagnosis Date   Anxiety    Anxiety and depression    Depression    Diverticulosis    GERD (gastroesophageal reflux disease)    Internal hemorrhoids    OA (osteoarthritis)    Obesity, Class III, BMI 40-49.9 (morbid obesity) ([place])    Osteopenia    Tubular adenoma    Urinary incontinence    Vitamin D deficiency     Past Surgical History:  Procedure Laterality Date   BREAST BIOPSY Right    COLONOSCOPY WITH PROPOFOL N/A 07/07/2016   Procedure: COLONOSCOPY WITH PROPOFOL;  Surgeon: Jerene Bears, MD;  Location: [facility];  Service: Gastroenterology;  Laterality: N/A;   ESOPHAGOGASTRODUODENOSCOPY (EGD) WITH PROPOFOL  N/A 07/07/2016   Procedure: ESOPHAGOGASTRODUODENOSCOPY (EGD) WITH PROPOFOL;  Surgeon: Jerene Bears, MD;  [facility]: [facility];  Service: Gastroenterology;  Laterality: N/A;   HYSTEROSCOPY WITH D & C N/A 01/16/2015   Procedure: DILATATION AND CURETTAGE /HYSTEROSCOPY;  Surgeon: Donnamae Jude, MD;  Location: [facility];  Service: Gynecology;  Laterality: N/A;   HYSTEROSCOPY WITH D & C N/A 06/06/2019   Procedure: DILATATION AND CURETTAGE /HYSTEROSCOPY;  Surgeon: Homero Fellers, MD;  Location: [facility];  Service: Gynecology;  Laterality: N/A;   JOINT REPLACEMENT     TOTAL KNEE ARTHROPLASTY  08/2008   Left    Family History  Problem Relation Age of Onset   Cancer Mother 53       breast   Breast cancer Mother 25   Cancer Daughter 65       breast   Breast cancer Daughter 15   Cancer Father    Colon cancer Neg Hx    Esophageal cancer Neg Hx    Rectal cancer Neg Hx    Stomach cancer Neg Hx     Social History   Socioeconomic History   Marital status: Widowed    Spouse name: Not on file   Number of children: 4   Years of education: Not on file   Highest education level: Not on file  Occupational History   Occupation: Armed forces technical officer  Tobacco Use   Smoking status: Never   Smokeless tobacco: Never  Vaping Use   Vaping Use: Never used  Substance and Sexual Activity   Alcohol use: No    Alcohol/week: 0.0 standard drinks   Drug use: No   Sexual activity: Not Currently    Birth control/protection: Post-menopausal  Other Topics Concern   Not on file  Social History Narrative   Not on file   Social Determinants of Health   Financial Resource Strain: Low Risk    Difficulty of Paying Living Expenses: Not hard at all  Food Insecurity: No Food Insecurity   Worried About Charity fundraiser in the Last Year: Never true   [place] in the Last Year: Never true  Transportation Needs: No Transportation Needs   Lack of Transportation (Medical): No   Lack of Transportation  (Non-Medical): No  Physical Activity: Inactive   Days of Exercise per Week: 0 days   Minutes of Exercise per Session: 0 min  Stress: Stress Concern Present   Feeling of Stress : To some extent  Social Connections: Not on file  Intimate Partner Violence: Not At Risk   Fear of Current or Ex-Partner: No   Emotionally Abused: No   Physically Abused: No   Sexually Abused: No    Outpatient Medications Prior to Visit  Medication Sig Dispense Refill   ALPRAZolam (XANAX) 0.5 MG tablet Take 1 tablet (0.5 mg total) by mouth at bedtime as needed for anxiety. 20 tablet 0   buPROPion (WELLBUTRIN SR) 150 MG 12 hr tablet Take 1 tab PO in am, 2 tabs PO in pm 90 tablet 0   clobetasol cream (TEMOVATE) 5.09 % Apply 1 application topically 2 (two) times daily. X 6 wks, then drop to once daily x 6 wks, then 2x/wk 30 g 5   famotidine (PEPCID) 40 MG tablet Take 1 tablet (40 mg total) by mouth at bedtime. 90 tablet 1   fluticasone (FLONASE) 50 MCG/ACT nasal spray Place 2 sprays into both nostrils daily. 16 g 6   gabapentin (NEURONTIN) 300 MG capsule Take 1 capsule by mouth 2 (two) times daily.     ibuprofen (ADVIL) 600 MG tablet Take 1 tablet (600 mg total) by mouth every 6 (six) hours as needed. (Patient taking differently: Take 600 mg by mouth every 6 (six) hours as needed for headache.) 60 tablet 3   levothyroxine (SYNTHROID) 50 MCG tablet Take 1 tablet (50 mcg total) by mouth daily. **NEEDS OFFICE/VIRTUAL VISIT, NO FURTHER REFILLS 90 tablet 3   Multiple Vitamin (MULTIVITAMIN WITH MINERALS) TABS tablet Take 1 tablet by mouth daily.     pregabalin (LYRICA) 75 MG capsule Take 1 capsule (75 mg total) by mouth 2 (two) times daily. 60 capsule 5   venlafaxine (EFFEXOR) 37.5 MG tablet Take 37.5 mg by mouth 2 (two) times daily.     venlafaxine (EFFEXOR) 37.5 MG tablet Start taking Effexor 37.5 mg daily for one week, then increase to 37.5 mg twice per day for mood and headaches.     gabapentin (NEURONTIN) 100 MG  capsule Take by mouth.     sertraline (ZOLOFT) 100 MG tablet TAKE 1 TABLET BY MOUTH EVERY DAY (Patient taking differently: Take 25 mg by mouth daily.) 90 tablet 1   No facility-administered medications prior to visit.    No Known Allergies  ROS Review of Systems  Constitutional:  Negative for chills, fatigue and fever.  Respiratory:  Negative for shortness of breath.  Cardiovascular:  Negative for chest pain.  Gastrointestinal:  Negative for nausea and vomiting.  Musculoskeletal:  Positive for arthralgias and gait problem.  Skin:  Negative for color change.  Psychiatric/Behavioral:  Negative for hallucinations and suicidal ideas.      Objective:    Physical Exam Vitals and nursing note reviewed.  Constitutional:      Appearance: She is obese.  HENT:     Right Ear: Tympanic membrane, ear canal and external ear normal. There is no impacted cerumen.     Left Ear: Tympanic membrane, ear canal and external ear normal. There is no impacted cerumen.     Mouth/Throat:     Mouth: Mucous membranes are moist.     Pharynx: Oropharynx is clear.  Eyes:     Extraocular Movements: Extraocular movements intact.     Pupils: Pupils are equal, round, and reactive to light.  Neck:     Thyroid: No thyroid mass, thyromegaly or thyroid tenderness.  Cardiovascular:     Rate and Rhythm: Normal rate and regular rhythm.  Pulmonary:     Effort: Pulmonary effort is normal.     Breath sounds: Normal breath sounds.  Abdominal:     General: Bowel sounds are normal.  Lymphadenopathy:     Cervical: No cervical adenopathy.  Neurological:     Mental Status: She is alert.  Psychiatric:        Mood and Affect: Mood normal.        Behavior: Behavior normal.        Thought Content: Thought content normal.        Judgment: Judgment normal.    BP (!) 142/74   Pulse 73   Temp 98 F (36.7 C)   Resp 16   Ht 4\' 10"  (1.473 m)   Wt 277 lb 8 oz (125.9 kg)   SpO2 96%   BMI 58.00 kg/m  Wt Readings from  Last 3 Encounters:  09/17/21 277 lb 8 oz (125.9 kg)  07/16/21 278 lb (126.1 kg)  04/01/21 280 lb (127 kg)   PHQ9 SCORE ONLY 06/04/2021 05/14/2020 03/12/2020  PHQ-9 Total Score 0 0 12     Health Maintenance Due  Topic Date Due   Hepatitis C Screening  Never done   Zoster Vaccines- Shingrix (1 of 2) Never done   DEXA SCAN  04/08/2019   COVID-19 Vaccine (2 - Pfizer series) 12/30/2020   INFLUENZA VACCINE  07/28/2021    There are no preventive care reminders to display for this patient.  Lab Results  Component Value Date   TSH 3.71 12/17/2020   Lab Results  Component Value Date   WBC 11.8 (H) 12/17/2020   HGB 14.8 12/17/2020   HCT 44.6 12/17/2020   MCV 89.1 12/17/2020   PLT 295.0 12/17/2020   Lab Results  Component Value Date   NA 142 12/17/2020   K 4.0 12/17/2020   CO2 34 (H) 12/17/2020   GLUCOSE 81 12/17/2020   BUN 23 12/17/2020   CREATININE 1.04 12/17/2020   BILITOT 0.8 02/21/2020   ALKPHOS 76 02/21/2020   AST 30 02/21/2020   ALT 24 02/21/2020   PROT 7.0 02/21/2020   ALBUMIN 3.5 02/21/2020   CALCIUM 9.7 12/17/2020   ANIONGAP 10 03/27/2020   GFR 55.93 (L) 12/17/2020   Lab Results  Component Value Date   CHOL 228 (H) 06/26/2020   Lab Results  Component Value Date   HDL 70.10 06/26/2020   Lab Results  Component Value Date   LDLCALC  139 (H) 06/26/2020   Lab Results  Component Value Date   TRIG 95.0 06/26/2020   Lab Results  Component Value Date   CHOLHDL 3 06/26/2020   Lab Results  Component Value Date   HGBA1C 5.8 12/17/2020      Assessment & Plan:   Problem List Items Addressed This Visit       Cardiovascular and Mediastinum   HTN (hypertension) - Primary    Blood pressures in office in past few visits within normal limits.  Patient not on any antihypertensive medications currently.  Continue current regimen.      Relevant Orders   CBC   Comprehensive metabolic panel     Respiratory   OSA (obstructive sleep apnea)    History of sleep  apnea.  Does see pulmonology.  States she does have a CPAP and wears it nightly.  Continue following up at pulmonology's recommendation.  Continue CPAP as prescribed        Endocrine   Hypothyroidism    History of hypothyroidism.  Last TSH within normal limits.  Patient states she is been on medication approximately 3 months.  We will recheck TSH level in office today presuming it will be abnormal.  Continue medication levothyroxine 50 mcg every morning.      Relevant Medications   levothyroxine (SYNTHROID) 50 MCG tablet   Other Relevant Orders   TSH     Musculoskeletal and Integument   Degenerative joint disease of knee, right    Patient has history of left knee replacement and dealing with right knee pain currently.  Is followed by orthopedics.  Per patient report she could benefit from a joint replacement but due to patient's BMI and comorbidities currently not able to have procedure.  Continue following with orthopedist and working on weight loss.        Other   BMI 50.0-59.9, adult [facility])    Patient currently morbidly obese.  We did discuss briefly her eating habits.  States she does not eat much today but does like sweet cakes and snacks.  We did discuss that she does need to eat 2-3 meals daily with no snacking.  Limiting her sugar intake inclusive of cakes, candy, sodas, juice.  Patient acknowledged given patient's habitus and orthopedic impairments unable to exercise traditionally.  Did review using computer resources such as YouTube or to do seated exercises with without resistance bands or weights.  She also discussed going to the [facility].  Told her if her [employer] had a pool aqua therapy would be of great benefit as it helps decompress some of the joints.  Patient will take the signs in the consideration.      Depression    Patient struggles with depression.  Did do PHQ-9 in office with score of 20+.  Patient on venlafaxine 37.5 mg twice daily along with Wellbutrin 150 mg 3 times daily.   Continue those medications.  Per patient venlafaxine has been started approximately 2 months ago.  She seems good spirits and offers no HI SI or AVH.  Continue medications we will refer her to therapy as an additional resource.  If no improvement over the next 3 months we will consider referring her to psychiatry for more thorough exam and pointed treatment approach. Continue taking venlafaxine 37.5 mg twice daily along with Wellbutrin 150 mg 1 cap A.m. and 2 caps p.m.      Relevant Medications   venlafaxine (EFFEXOR) 37.5 MG tablet   ALPRAZolam (XANAX) 0.5 MG tablet  buPROPion (WELLBUTRIN SR) 150 MG 12 hr tablet   Other Relevant Orders   Ambulatory referral to Psychology   Memory changes    Complaint of patient's.  Did see neurology note they are managing it.  Will refer to neurology has been approximate 9 months since B12 check, looking at last 3 numbers it is trending downward we will recheck B12 today.  Pending results      Relevant Orders   Vitamin B12   Other Visit Diagnoses     Anxiety and depression       Relevant Medications   venlafaxine (EFFEXOR) 37.5 MG tablet   ALPRAZolam (XANAX) 0.5 MG tablet   buPROPion (WELLBUTRIN SR) 150 MG 12 hr tablet   Other Relevant Orders   Ambulatory referral to Psychology   Need for influenza vaccination       Relevant Orders   Flu Vaccine QUAD High Dose(Fluad) (Completed)       No orders of the defined types were placed in this encounter.   Follow-up: No follow-ups on file.   This visit occurred during the SARS-CoV-2 public health emergency.  Safety protocols were in place, including screening questions prior to the visit, additional usage of staff PPE, and extensive cleaning of exam room while observing appropriate contact time as indicated for disinfecting solutions.   Romilda Garret, NP

## 2021-09-17 NOTE — Assessment & Plan Note (Signed)
History of hypothyroidism.  Last TSH within normal limits.  Patient states she is been on medication approximately 3 months.  We will recheck TSH level in office today presuming it will be abnormal.  Continue medication levothyroxine 50 mcg every morning.

## 2021-09-17 NOTE — Assessment & Plan Note (Signed)
History of sleep apnea.  Does see pulmonology.  States she does have a CPAP and wears it nightly.  Continue following up at pulmonology's recommendation.  Continue CPAP as prescribed

## 2021-09-17 NOTE — Assessment & Plan Note (Signed)
Patient struggles with depression.  Did do PHQ-9 in office with score of 20+.  Patient on venlafaxine 37.5 mg twice daily along with Wellbutrin 150 mg 3 times daily.  Continue those medications.  Per patient venlafaxine has been started approximately 2 months ago.  She seems good spirits and offers no HI SI or AVH.  Continue medications we will refer her to therapy as an additional resource.  If no improvement over the next 3 months we will consider referring her to psychiatry for more thorough exam and pointed treatment approach. Continue taking venlafaxine 37.5 mg twice daily along with Wellbutrin 150 mg 1 cap A.m. and 2 caps p.m.

## 2021-10-28 ENCOUNTER — Encounter: Payer: Self-pay | Admitting: Obstetrics and Gynecology

## 2021-10-28 ENCOUNTER — Ambulatory Visit (INDEPENDENT_AMBULATORY_CARE_PROVIDER_SITE_OTHER): Payer: Medicare Other | Admitting: Obstetrics and Gynecology

## 2021-10-28 ENCOUNTER — Other Ambulatory Visit: Payer: Self-pay

## 2021-10-28 VITALS — BP 132/80 | Ht <= 58 in | Wt 270.6 lb

## 2021-10-28 DIAGNOSIS — Z1231 Encounter for screening mammogram for malignant neoplasm of breast: Secondary | ICD-10-CM | POA: Diagnosis not present

## 2021-10-28 DIAGNOSIS — N3001 Acute cystitis with hematuria: Secondary | ICD-10-CM

## 2021-10-28 DIAGNOSIS — R300 Dysuria: Secondary | ICD-10-CM | POA: Diagnosis not present

## 2021-10-28 LAB — POCT URINALYSIS DIPSTICK
Bilirubin, UA: NEGATIVE
Blood, UA: POSITIVE
Glucose, UA: NEGATIVE
Ketones, UA: NEGATIVE
Nitrite, UA: POSITIVE
Protein, UA: POSITIVE — AB
Spec Grav, UA: 1.02 (ref 1.010–1.025)
Urobilinogen, UA: 1 E.U./dL
pH, UA: 5 (ref 5.0–8.0)

## 2021-10-28 MED ORDER — NITROFURANTOIN MONOHYD MACRO 100 MG PO CAPS: 100.0000 mg | ORAL_CAPSULE | Freq: Two times a day (BID) | ORAL | 0 refills | Status: AC

## 2021-10-28 NOTE — Progress Notes (Signed)
Patient ID: Joan Russo, female   DOB: 08-Oct-1956, 65 y.o.   MRN: [id number]  Reason for Consult: Gynecologic Exam   Referred by Michela Pitcher, NP  Subjective:     HPI:  Joan Russo is a 65 y.o. female she is here today with new onset urinary complaints.  She reports that for the last week she has been having a slight discomfort with urination.  She also is having increased urinary incontinence where if she stands she may urinate.  She reports that this is a new acute onset symptoms.  Additionally she would like me to take a look at her bottom.  She has a history of lichen sclerosis which she has been applying clobetasol ointment twice a day to for maintenance.  She reports that she is experiencing decreased itching.  Gynecological History  No LMP recorded. Patient is postmenopausal.  Past Medical History:  Diagnosis Date   Anxiety    Anxiety and depression    Depression    Diverticulosis    GERD (gastroesophageal reflux disease)    Internal hemorrhoids    OA (osteoarthritis)    Obesity, Class III, BMI 40-49.9 (morbid obesity) ([facility])    Osteopenia    Tubular adenoma    Urinary incontinence    Vitamin D deficiency    Family History  Problem Relation Age of Onset   Cancer Mother 94       breast   Breast cancer Mother 33   Cancer Daughter 23       breast   Breast cancer Daughter 59   Cancer Father    Colon cancer Neg Hx    Esophageal cancer Neg Hx    Rectal cancer Neg Hx    Stomach cancer Neg Hx    Past Surgical History:  Procedure Laterality Date   BREAST BIOPSY Right    COLONOSCOPY WITH PROPOFOL N/A 07/07/2016   Procedure: COLONOSCOPY WITH PROPOFOL;  Surgeon: Jerene Bears, MD;  [facility];  Service: Gastroenterology;  Laterality: N/A;   ESOPHAGOGASTRODUODENOSCOPY (EGD) WITH PROPOFOL N/A 07/07/2016   Procedure: ESOPHAGOGASTRODUODENOSCOPY (EGD) WITH PROPOFOL;  Surgeon: Jerene Bears, MD;  [facility];  Service: Gastroenterology;   Laterality: N/A;   HYSTEROSCOPY WITH D & C N/A 01/16/2015   Procedure: DILATATION AND CURETTAGE /HYSTEROSCOPY;  Surgeon: Donnamae Jude, MD;  Location: [facility];  Service: Gynecology;  Laterality: N/A;   HYSTEROSCOPY WITH D & C N/A 06/06/2019   Procedure: DILATATION AND CURETTAGE /HYSTEROSCOPY;  Surgeon: Homero Fellers, MD;  Location: [facility];  Service: Gynecology;  Laterality: N/A;   JOINT REPLACEMENT     TOTAL KNEE ARTHROPLASTY  08/2008   Left    Short Social History:  Social History   Tobacco Use   Smoking status: Never   Smokeless tobacco: Never  Substance Use Topics   Alcohol use: No    Alcohol/week: 0.0 standard drinks    No Known Allergies  Current Outpatient Medications  Medication Sig Dispense Refill   ALPRAZolam (XANAX) 0.5 MG tablet Take 1 tablet (0.5 mg total) by mouth at bedtime as needed for anxiety. 20 tablet 0   buPROPion (WELLBUTRIN SR) 150 MG 12 hr tablet Take 1 tab PO in am, 2 tabs PO in pm 270 tablet 1   clobetasol cream (TEMOVATE) 6.73 % Apply 1 application topically 2 (two) times daily. X 6 wks, then drop to once daily x 6 wks, then 2x/wk 30 g 5   famotidine (PEPCID) 40 MG tablet  Take 1 tablet (40 mg total) by mouth at bedtime. 90 tablet 1   fluticasone (FLONASE) 50 MCG/ACT nasal spray Place 2 sprays into both nostrils daily. 16 g 6   gabapentin (NEURONTIN) 300 MG capsule Take 1 capsule by mouth 2 (two) times daily.     ibuprofen (ADVIL) 600 MG tablet Take 1 tablet (600 mg total) by mouth every 6 (six) hours as needed. (Patient taking differently: Take 600 mg by mouth every 6 (six) hours as needed for headache.) 60 tablet 3   levothyroxine (SYNTHROID) 50 MCG tablet Take 1 tablet (50 mcg total) by mouth daily. 90 tablet 3   Multiple Vitamin (MULTIVITAMIN WITH MINERALS) TABS tablet Take 1 tablet by mouth daily.     pregabalin (LYRICA) 75 MG capsule Take 1 capsule (75 mg total) by mouth 2 (two) times daily. 60 capsule 5   venlafaxine (EFFEXOR) 37.5 MG tablet  Take 37.5 mg by mouth 2 (two) times daily.     No current facility-administered medications for this visit.    Review of Systems  Constitutional: Negative for chills, fatigue, fever and unexpected weight change.  HENT: Negative for trouble swallowing.  Eyes: Negative for loss of vision.  Respiratory: Negative for cough, shortness of breath and wheezing.  Cardiovascular: Negative for chest pain, leg swelling, palpitations and syncope.  GI: Negative for abdominal pain, blood in stool, diarrhea, nausea and vomiting.  GU: Negative for difficulty urinating, dysuria, frequency and hematuria.  Musculoskeletal: Negative for back pain, leg pain and joint pain.  Skin: Negative for rash.  Neurological: Negative for dizziness, headaches, light-headedness, numbness and seizures.  Psychiatric: Negative for behavioral problem, confusion, depressed mood and sleep disturbance.       Objective:  Objective   Vitals:   10/28/21 1110  BP: 132/80  Weight: 270 lb 9.6 oz (122.7 kg)  Height: 4\' 10"  (1.473 m)   Body mass index is 56.56 kg/m.  Physical Exam Vitals and nursing note reviewed. Exam conducted with a chaperone present.  Constitutional:      Appearance: Normal appearance. She is well-developed.  HENT:     Head: Normocephalic and atraumatic.  Eyes:     Extraocular Movements: Extraocular movements intact.     Pupils: Pupils are equal, round, and reactive to light.  Cardiovascular:     Rate and Rhythm: Normal rate and regular rhythm.  Pulmonary:     Effort: Pulmonary effort is normal. No respiratory distress.     Breath sounds: Normal breath sounds.  Abdominal:     General: Abdomen is flat.     Palpations: Abdomen is soft.  Genitourinary:   Musculoskeletal:        General: No signs of injury.  Skin:    General: Skin is warm and dry.  Neurological:     Mental Status: She is alert and oriented to person, place, and time.  Psychiatric:        Behavior: Behavior normal.         Thought Content: Thought content normal.        Judgment: Judgment normal.    Assessment/Plan:     65 year old with dysuria.  Urinalysis today suggestive of urinary tract infection.  Will treat with Macrobid.  Urine culture sent.  Discussed that urinary tract infections can cause urinary incontinence.  Given that her incontinence is acute onset then would expect improvement.  However if her incontinence does not improve we can evaluate her for urinary incontinence discussed management options.  Lichen sclerosus-continue with twice weekly application  of clobetasol.  Discussed attention to rectal area.  Continue monitoring with pelvic exams every 6 months.  Mammogram ordered today  More than 20 minutes were spent face to face with the patient in the room, reviewing the medical record, labs and images, and coordinating care for the patient. The plan of management was discussed in detail and counseling was provided.     Adrian Prows MD [facility] 10/28/2021 11:56 AM

## 2021-10-28 NOTE — Patient Instructions (Signed)
Urinary Tract Infection, Adult A urinary tract infection (UTI) is an infection of any part of the urinary tract. The urinary tract includes the kidneys, ureters, bladder, and urethra. These organs make, store, and get rid of urine in the body. An upper UTI affects the ureters and kidneys. A lower UTI affects the bladder and urethra. What are the causes? Most urinary tract infections are caused by bacteria in your genital area around your urethra, where urine leaves your body. These bacteria grow and cause inflammation of your urinary tract. What increases the risk? You are more likely to develop this condition if: You have a urinary catheter that stays in place. You are not able to control when you urinate or have a bowel movement (incontinence). You are female and you: Use a spermicide or diaphragm for birth control. Have low estrogen levels. Are pregnant. You have certain genes that increase your risk. You are sexually active. You take antibiotic medicines. You have a condition that causes your flow of urine to slow down, such as: An enlarged prostate, if you are female. Blockage in your urethra. A kidney stone. A nerve condition that affects your bladder control (neurogenic bladder). Not getting enough to drink, or not urinating often. You have certain medical conditions, such as: Diabetes. A weak disease-fighting system (immunesystem). Sickle cell disease. Gout. Spinal cord injury. What are the signs or symptoms? Symptoms of this condition include: Needing to urinate right away (urgency). Frequent urination. This may include small amounts of urine each time you urinate. Pain or burning with urination. Blood in the urine. Urine that smells bad or unusual. Trouble urinating. Cloudy urine. Vaginal discharge, if you are female. Pain in the abdomen or the lower back. You may also have: Vomiting or a decreased appetite. Confusion. Irritability or tiredness. A fever or  chills. Diarrhea. The first symptom in older adults may be confusion. In some cases, they may not have any symptoms until the infection has worsened. How is this diagnosed? This condition is diagnosed based on your medical history and a physical exam. You may also have other tests, including: Urine tests. Blood tests. Tests for STIs (sexually transmitted infections). If you have had more than one UTI, a cystoscopy or imaging studies may be done to determine the cause of the infections. How is this treated? Treatment for this condition includes: Antibiotic medicine. Over-the-counter medicines to treat discomfort. Drinking enough water to stay hydrated. If you have frequent infections or have other conditions such as a kidney stone, you may need to see a health care provider who specializes in the urinary tract (urologist). In rare cases, urinary tract infections can cause sepsis. Sepsis is a life-threatening condition that occurs when the body responds to an infection. Sepsis is treated in the hospital with IV antibiotics, fluids, and other medicines. Follow these instructions at home: Medicines Take over-the-counter and prescription medicines only as told by your health care provider. If you were prescribed an antibiotic medicine, take it as told by your health care provider. Do not stop using the antibiotic even if you start to feel better. General instructions Make sure you: Empty your bladder often and completely. Do not hold urine for long periods of time. Empty your bladder after sex. Wipe from front to back after urinating or having a bowel movement if you are female. Use each tissue only one time when you wipe. Drink enough fluid to keep your urine pale yellow. Keep all follow-up visits. This is important. Contact a health care provider   if: Your symptoms do not get better after 1-2 days. Your symptoms go away and then return. Get help right away if: You have severe pain in your  back or your lower abdomen. You have a fever or chills. You have nausea or vomiting. Summary A urinary tract infection (UTI) is an infection of any part of the urinary tract, which includes the kidneys, ureters, bladder, and urethra. Most urinary tract infections are caused by bacteria in your genital area. Treatment for this condition often includes antibiotic medicines. If you were prescribed an antibiotic medicine, take it as told by your health care provider. Do not stop using the antibiotic even if you start to feel better. Keep all follow-up visits. This is important. This information is not intended to replace advice given to you by your health care provider. Make sure you discuss any questions you have with your health care provider. Document Revised: 07/26/2020 Document Reviewed: 07/26/2020 Elsevier Patient Education  2022 [employer]  

## 2021-11-03 LAB — URINE CULTURE

## 2021-11-17 ENCOUNTER — Other Ambulatory Visit: Payer: Self-pay | Admitting: Obstetrics and Gynecology

## 2021-11-17 DIAGNOSIS — R928 Other abnormal and inconclusive findings on diagnostic imaging of breast: Secondary | ICD-10-CM

## 2021-11-26 ENCOUNTER — Other Ambulatory Visit: Payer: Self-pay | Admitting: Family

## 2021-11-26 DIAGNOSIS — M797 Fibromyalgia: Secondary | ICD-10-CM

## 2021-11-26 NOTE — Telephone Encounter (Signed)
Spoke with patient. Patient states she does take both medications. She saw neurologist who added Gabapentin I verified both medications doses and directions and confirmed that those are correct.

## 2021-11-26 NOTE — Telephone Encounter (Signed)
I got the refill request for patient. We have gabapentin and lyrica on her medication list. Is she taking both medications. Looks like neurology is writing the gabapentin and we are writing the lyrica

## 2021-11-29 ENCOUNTER — Encounter: Payer: Self-pay | Admitting: Nurse Practitioner

## 2021-12-03 ENCOUNTER — Ambulatory Visit (INDEPENDENT_AMBULATORY_CARE_PROVIDER_SITE_OTHER): Payer: Medicare Other | Admitting: Nurse Practitioner

## 2021-12-03 ENCOUNTER — Other Ambulatory Visit: Payer: Self-pay

## 2021-12-03 VITALS — BP 146/72 | HR 84 | Temp 96.6°F | Resp 14 | Ht <= 58 in | Wt 266.1 lb

## 2021-12-03 DIAGNOSIS — R300 Dysuria: Secondary | ICD-10-CM

## 2021-12-03 DIAGNOSIS — R808 Other proteinuria: Secondary | ICD-10-CM

## 2021-12-03 DIAGNOSIS — R109 Unspecified abdominal pain: Secondary | ICD-10-CM | POA: Diagnosis not present

## 2021-12-03 DIAGNOSIS — F419 Anxiety disorder, unspecified: Secondary | ICD-10-CM

## 2021-12-03 DIAGNOSIS — F32A Depression, unspecified: Secondary | ICD-10-CM

## 2021-12-03 DIAGNOSIS — R140 Abdominal distension (gaseous): Secondary | ICD-10-CM

## 2021-12-03 DIAGNOSIS — R42 Dizziness and giddiness: Secondary | ICD-10-CM | POA: Diagnosis not present

## 2021-12-03 LAB — CBC WITH DIFFERENTIAL/PLATELET
Basophils Absolute: 0 10*3/uL (ref 0.0–0.1)
Basophils Relative: 0.6 % (ref 0.0–3.0)
Eosinophils Absolute: 0.4 10*3/uL (ref 0.0–0.7)
Eosinophils Relative: 4.4 % (ref 0.0–5.0)
HCT: 44.5 % (ref 36.0–46.0)
Hemoglobin: 14.8 g/dL (ref 12.0–15.0)
Lymphocytes Relative: 47.4 % — ABNORMAL HIGH (ref 12.0–46.0)
Lymphs Abs: 4 10*3/uL (ref 0.7–4.0)
MCHC: 33.4 g/dL (ref 30.0–36.0)
MCV: 89.3 fl (ref 78.0–100.0)
Monocytes Absolute: 0.6 10*3/uL (ref 0.1–1.0)
Monocytes Relative: 7.3 % (ref 3.0–12.0)
Neutro Abs: 3.4 10*3/uL (ref 1.4–7.7)
Neutrophils Relative %: 40.3 % — ABNORMAL LOW (ref 43.0–77.0)
Platelets: 243 10*3/uL (ref 150.0–400.0)
RBC: 4.98 Mil/uL (ref 3.87–5.11)
RDW: 14.4 % (ref 11.5–15.5)
WBC: 8.4 10*3/uL (ref 4.0–10.5)

## 2021-12-03 LAB — COMPREHENSIVE METABOLIC PANEL
ALT: 15 U/L (ref 0–35)
AST: 20 U/L (ref 0–37)
Albumin: 4.3 g/dL (ref 3.5–5.2)
Alkaline Phosphatase: 81 U/L (ref 39–117)
BUN: 14 mg/dL (ref 6–23)
CO2: 29 mEq/L (ref 19–32)
Calcium: 10 mg/dL (ref 8.4–10.5)
Chloride: 105 mEq/L (ref 96–112)
Creatinine, Ser: 1.06 mg/dL (ref 0.40–1.20)
GFR: 54.3 mL/min — ABNORMAL LOW (ref >60.00)
Glucose, Bld: 88 mg/dL (ref 70–99)
Potassium: 4.5 mEq/L (ref 3.5–5.1)
Sodium: 140 mEq/L (ref 135–145)
Total Bilirubin: 0.5 mg/dL (ref 0.2–1.2)
Total Protein: 7.3 g/dL (ref 6.0–8.3)

## 2021-12-03 LAB — POCT URINALYSIS DIP (CLINITEK)
Bilirubin, UA: NEGATIVE
Blood, UA: NEGATIVE
Glucose, UA: NEGATIVE mg/dL
Ketones, POC UA: NEGATIVE mg/dL
Leukocytes, UA: NEGATIVE
Nitrite, UA: NEGATIVE
Spec Grav, UA: >=1.03 — AB (ref 1.010–1.025)
Urobilinogen, UA: 0.2 E.U./dL
pH, UA: 5.5 (ref 5.0–8.0)

## 2021-12-03 LAB — LIPASE: Lipase: 18 U/L (ref 11.0–59.0)

## 2021-12-03 LAB — MICROALBUMIN / CREATININE URINE RATIO
Creatinine,U: 156 mg/dL
Microalb Creat Ratio: 0.5 mg/g (ref 0.0–30.0)
Microalb, Ur: 0.8 mg/dL (ref 0.0–1.9)

## 2021-12-03 MED ORDER — ALPRAZOLAM 0.5 MG PO TABS: 0.5000 mg | ORAL_TABLET | Freq: Every evening | ORAL | 0 refills | Status: DC | PRN

## 2021-12-03 MED ORDER — OMEPRAZOLE 20 MG PO CPDR: 20.0000 mg | DELAYED_RELEASE_CAPSULE | Freq: Every day | ORAL | 0 refills | Status: DC

## 2021-12-03 MED ORDER — MECLIZINE HCL 12.5 MG PO TABS: 12.5000 mg | ORAL_TABLET | Freq: Every day | ORAL | 0 refills | Status: DC | PRN

## 2021-12-03 NOTE — Assessment & Plan Note (Signed)
Was seen by urology and written medication for urinary tract infection.  Symptoms returned shortly after finishing course.  UA was negative in office.  Continue to monitor. Did note incidental proteinuria and last UA and today's we will send off for protein/creatinine ratio testing

## 2021-12-03 NOTE — Assessment & Plan Note (Signed)
Patient experiencing what sounds to be BPPV.  Has history of similar dizzy spells.  He is on several medications that can contribute but per patient report only happens when she rolls over in bed.  We will send in some meclizine for symptomatic relief neuro exam benign in office continue to monitor if no improvement follow-up.  Discussed signs and symptoms when to seek urgent or emergent health care patient acknowledged

## 2021-12-03 NOTE — Progress Notes (Signed)
Established Patient Office Visit  Subjective:  Patient ID: Joan Russo, female    DOB: [birth year]  Age: 65 y.o. MRN: [id number]  CC:  Chief Complaint  Patient presents with   Decreased appetite    Issue the past 3 months. No desire to eat and also because she has been eating more than cereal she has diarrhea. No nausea or vomiting.     HPI Joan Russo presents for decreased appetite  States that she just does not eat. Has been approx 3 months when she noticed it. States that she can eat and have "explosive diarrhea". Has been eating cereal.  Increased gas from her mouth and bottom. Has had an endoscopy that was negaitve minus an eshopheal ring. If she eats quickly it gets stuck approx half way down and then she will gag  History of  reflux already maintained on H2  Dizziness: intermittent in nature when she is rolling over in bed. Short lived. 2-3 times weekly. She can lay still and place her fingers on her forehead and get it to stop  Urinary symptoms: was seen by urlogist and treated for a UTI. Completed a 10 days course antibitics returned approx 2 weeks ago. Past Medical History:  Diagnosis Date   Anxiety    Anxiety and depression    Depression    Diverticulosis    GERD (gastroesophageal reflux disease)    Internal hemorrhoids    OA (osteoarthritis)    Obesity, Class III, BMI 40-49.9 (morbid obesity) ([place])    Osteopenia    Tubular adenoma    Urinary incontinence    Vitamin D deficiency     Past Surgical History:  Procedure Laterality Date   BREAST BIOPSY Right    COLONOSCOPY WITH PROPOFOL N/A 07/07/2016   Procedure: COLONOSCOPY WITH PROPOFOL;  Surgeon: Jerene Bears, MD;  Location: [facility];  Service: Gastroenterology;  Laterality: N/A;   ESOPHAGOGASTRODUODENOSCOPY (EGD) WITH PROPOFOL N/A 07/07/2016   Procedure: ESOPHAGOGASTRODUODENOSCOPY (EGD) WITH PROPOFOL;  Surgeon: Jerene Bears, MD;  Location: [facility];  Service: Gastroenterology;   Laterality: N/A;   HYSTEROSCOPY WITH D & C N/A 01/16/2015   Procedure: DILATATION AND CURETTAGE /HYSTEROSCOPY;  Surgeon: Donnamae Jude, MD;  Location: [facility];  Service: Gynecology;  Laterality: N/A;   HYSTEROSCOPY WITH D & C N/A 06/06/2019   Procedure: DILATATION AND CURETTAGE /HYSTEROSCOPY;  Surgeon: Homero Fellers, MD;  Location: [facility];  Service: Gynecology;  Laterality: N/A;   JOINT REPLACEMENT     TOTAL KNEE ARTHROPLASTY  08/2008   Left    Family History  Problem Relation Age of Onset   Cancer Mother 63       breast   Breast cancer Mother 59   Cancer Daughter 80       breast   Breast cancer Daughter 41   Cancer Father    Colon cancer Neg Hx    Esophageal cancer Neg Hx    Rectal cancer Neg Hx    Stomach cancer Neg Hx     Social History   Socioeconomic History   Marital status: Widowed    Spouse name: Not on file   Number of children: 4   Years of education: Not on file   Highest education level: Not on file  Occupational History   Occupation: Armed forces technical officer  Tobacco Use   Smoking status: Never   Smokeless tobacco: Never  Vaping Use   Vaping Use: Never used  Substance and Sexual Activity   Alcohol  use: No    Alcohol/week: 0.0 standard drinks   Drug use: No   Sexual activity: Not Currently    Birth control/protection: Post-menopausal  Other Topics Concern   Not on file  Social History Narrative   Not on file   Social Determinants of Health   Financial Resource Strain: Low Risk    Difficulty of Paying Living Expenses: Not hard at all  Food Insecurity: No Food Insecurity   Worried About Charity fundraiser in the Last Year: Never true   [place] in the Last Year: Never true  Transportation Needs: No Transportation Needs   Lack of Transportation (Medical): No   Lack of Transportation (Non-Medical): No  Physical Activity: Inactive   Days of Exercise per Week: 0 days   Minutes of Exercise per Session: 0 min  Stress: Stress Concern Present    Feeling of Stress : To some extent  Social Connections: Not on file  Intimate Partner Violence: Not At Risk   Fear of Current or Ex-Partner: No   Emotionally Abused: No   Physically Abused: No   Sexually Abused: No    Outpatient Medications Prior to Visit  Medication Sig Dispense Refill   ALPRAZolam (XANAX) 0.5 MG tablet Take 1 tablet (0.5 mg total) by mouth at bedtime as needed for anxiety. 20 tablet 0   buPROPion (WELLBUTRIN SR) 150 MG 12 hr tablet Take 1 tab PO in am, 2 tabs PO in pm 270 tablet 1   clobetasol cream (TEMOVATE) 9.70 % Apply 1 application topically 2 (two) times daily. X 6 wks, then drop to once daily x 6 wks, then 2x/wk 30 g 5   famotidine (PEPCID) 40 MG tablet Take 1 tablet (40 mg total) by mouth at bedtime. 90 tablet 1   fluticasone (FLONASE) 50 MCG/ACT nasal spray Place 2 sprays into both nostrils daily. 16 g 6   gabapentin (NEURONTIN) 300 MG capsule Take 1 capsule by mouth 2 (two) times daily.     ibuprofen (ADVIL) 600 MG tablet Take 1 tablet (600 mg total) by mouth every 6 (six) hours as needed. (Patient taking differently: Take 600 mg by mouth every 6 (six) hours as needed for headache.) 60 tablet 3   levothyroxine (SYNTHROID) 50 MCG tablet Take 1 tablet (50 mcg total) by mouth daily. 90 tablet 3   Multiple Vitamin (MULTIVITAMIN WITH MINERALS) TABS tablet Take 1 tablet by mouth daily.     pregabalin (LYRICA) 75 MG capsule TAKE 1 CAPSULE BY MOUTH TWICE A DAY 60 capsule 3   venlafaxine (EFFEXOR) 37.5 MG tablet Take 37.5 mg by mouth 2 (two) times daily.     No facility-administered medications prior to visit.    No Known Allergies  ROS Review of Systems  Constitutional:  Positive for chills. Negative for fatigue and fever.  Respiratory:  Positive for choking. Negative for cough and shortness of breath.   Cardiovascular:  Negative for chest pain.  Gastrointestinal:  Positive for diarrhea. Negative for abdominal pain, nausea and vomiting.       Stool light  brown  Genitourinary:  Positive for dysuria and urgency. Negative for frequency and hematuria.  Neurological:  Positive for dizziness. Negative for weakness and numbness.     Objective:    Physical Exam Vitals and nursing note reviewed.  Constitutional:      Appearance: She is obese.     Comments: Uses a cain to ambulate  HENT:     Right Ear: Ear canal and external  ear normal. There is no impacted cerumen.     Left Ear: Tympanic membrane, ear canal and external ear normal. There is no impacted cerumen.     Ears:     Comments: Clear fluid behind right TM    Mouth/Throat:     Mouth: Mucous membranes are moist.     Pharynx: Oropharynx is clear.  Eyes:     Extraocular Movements: Extraocular movements intact.     Pupils: Pupils are equal, round, and reactive to light.  Cardiovascular:     Rate and Rhythm: Normal rate and regular rhythm.  Pulmonary:     Effort: Pulmonary effort is normal.     Breath sounds: Normal breath sounds.  Abdominal:     General: Bowel sounds are normal. There is no distension.     Palpations: There is no mass.     Tenderness: There is abdominal tenderness. There is no right CVA tenderness or left CVA tenderness.    Skin:    General: Skin is warm.  Neurological:     General: No focal deficit present.     Mental Status: She is alert.     Motor: No weakness.     Gait: Gait normal.     Deep Tendon Reflexes: Reflexes normal.  Psychiatric:        Mood and Affect: Mood normal.        Behavior: Behavior normal.        Thought Content: Thought content normal.        Judgment: Judgment normal.    BP (!) 146/72   Pulse 84   Temp (!) 96.6 F (35.9 C)   Resp 14   Ht 4\' 10"  (1.473 m)   Wt 266 lb 2 oz (120.7 kg)   SpO2 97%   BMI 55.62 kg/m  Wt Readings from Last 3 Encounters:  12/03/21 266 lb 2 oz (120.7 kg)  10/28/21 270 lb 9.6 oz (122.7 kg)  09/17/21 277 lb 8 oz (125.9 kg)     Health Maintenance Due  Topic Date Due   Hepatitis C Screening   Never done   Zoster Vaccines- Shingrix (1 of 2) Never done   Pneumonia Vaccine 39+ Years old (1 - PCV) Never done   DEXA SCAN  04/08/2019   COVID-19 Vaccine (2 - Pfizer series) 12/30/2020    There are no preventive care reminders to display for this patient.  Lab Results  Component Value Date   TSH 4.05 09/17/2021   Lab Results  Component Value Date   WBC 9.6 09/17/2021   HGB 14.2 09/17/2021   HCT 43.6 09/17/2021   MCV 90.6 09/17/2021   PLT 265.0 09/17/2021   Lab Results  Component Value Date   NA 144 09/17/2021   K 4.3 09/17/2021   CO2 30 09/17/2021   GLUCOSE 89 09/17/2021   BUN 14 09/17/2021   CREATININE 0.92 09/17/2021   BILITOT 0.5 09/17/2021   ALKPHOS 75 09/17/2021   AST 21 09/17/2021   ALT 19 09/17/2021   PROT 6.8 09/17/2021   ALBUMIN 4.1 09/17/2021   CALCIUM 9.6 09/17/2021   ANIONGAP 10 03/27/2020   GFR 64.46 09/17/2021   Lab Results  Component Value Date   CHOL 228 (H) 06/26/2020   Lab Results  Component Value Date   HDL 70.10 06/26/2020   Lab Results  Component Value Date   LDLCALC 139 (H) 06/26/2020   Lab Results  Component Value Date   TRIG 95.0 06/26/2020   Lab Results  Component  Value Date   CHOLHDL 3 06/26/2020   Lab Results  Component Value Date   HGBA1C 5.8 12/17/2020      Assessment & Plan:   Problem List Items Addressed This Visit       Other   Vertigo    Patient experiencing what sounds to be BPPV.  Has history of similar dizzy spells.  He is on several medications that can contribute but per patient report only happens when she rolls over in bed.  We will send in some meclizine for symptomatic relief neuro exam benign in office continue to monitor if no improvement follow-up.  Discussed signs and symptoms when to seek urgent or emergent health care patient acknowledged      Relevant Medications   meclizine (ANTIVERT) 12.5 MG tablet   Bloating - Primary    We will start patient on PPI as she is already on H2 blocker.   This is not beneficial can consider probiotic use and referral to GI provider      Relevant Medications   omeprazole (PRILOSEC) 20 MG capsule   Abdominal discomfort    Had discomfort in the right upper quadrant and epigastric area.  Patient is already on famotidine 40 mg at night.  We will add omeprazole and use famotidine only as needed.  See if this helps if not patient will need follow-up with GI provider.  Pending lab results      Relevant Medications   omeprazole (PRILOSEC) 20 MG capsule   Other Relevant Orders   CBC with Differential/Platelet   Lipase   Comprehensive metabolic panel   Dysuria    Was seen by urology and written medication for urinary tract infection.  Symptoms returned shortly after finishing course.  UA was negative in office.  Continue to monitor. Did note incidental proteinuria and last UA and today's we will send off for protein/creatinine ratio testing      Relevant Orders   POCT URINALYSIS DIP (CLINITEK) (Completed)   Other proteinuria    2 UAs with protein present we will send off for protein/creatinine ratio      Relevant Orders   Microalbumin/Creatinine Ratio, Urine   Other Visit Diagnoses     Anxiety and depression       Relevant Medications   ALPRAZolam (XANAX) 0.5 MG tablet       No orders of the defined types were placed in this encounter. Fluid behind right ear  Follow-up: No follow-ups on file.   This visit occurred during the SARS-CoV-2 public health emergency.  Safety protocols were in place, including screening questions prior to the visit, additional usage of staff PPE, and extensive cleaning of exam room while observing appropriate contact time as indicated for disinfecting solutions.   Romilda Garret, NP

## 2021-12-03 NOTE — Assessment & Plan Note (Signed)
Had discomfort in the right upper quadrant and epigastric area.  Patient is already on famotidine 40 mg at night.  We will add omeprazole and use famotidine only as needed.  See if this helps if not patient will need follow-up with GI provider.  Pending lab results

## 2021-12-03 NOTE — Assessment & Plan Note (Signed)
2 UAs with protein present we will send off for protein/creatinine ratio

## 2021-12-03 NOTE — Patient Instructions (Signed)
Nice to see you today Will trail some medication. You can just take the omeprazole. If you need the Pepcid (famotidine) you can take it. Let me know if your symptoms dont improve we can get you over to the GI doctors.

## 2021-12-03 NOTE — Assessment & Plan Note (Signed)
We will start patient on PPI as she is already on H2 blocker.  This is not beneficial can consider probiotic use and referral to GI provider

## 2021-12-07 ENCOUNTER — Other Ambulatory Visit: Payer: Self-pay | Admitting: Family Medicine

## 2021-12-07 DIAGNOSIS — K219 Gastro-esophageal reflux disease without esophagitis: Secondary | ICD-10-CM

## 2021-12-29 ENCOUNTER — Other Ambulatory Visit: Payer: Self-pay | Admitting: Nurse Practitioner

## 2021-12-29 DIAGNOSIS — R109 Unspecified abdominal pain: Secondary | ICD-10-CM

## 2021-12-29 DIAGNOSIS — R140 Abdominal distension (gaseous): Secondary | ICD-10-CM

## 2022-01-02 ENCOUNTER — Telehealth: Payer: Medicare HMO | Admitting: Nurse Practitioner

## 2022-01-02 ENCOUNTER — Encounter: Payer: Self-pay | Admitting: Nurse Practitioner

## 2022-01-02 DIAGNOSIS — U071 COVID-19: Secondary | ICD-10-CM | POA: Diagnosis not present

## 2022-01-02 MED ORDER — MOLNUPIRAVIR EUA 200MG CAPSULE: 4.0000 | ORAL_CAPSULE | Freq: Two times a day (BID) | ORAL | 0 refills | Status: AC

## 2022-01-02 MED ORDER — ALBUTEROL SULFATE HFA 108 (90 BASE) MCG/ACT IN AERS: 2.0000 | INHALATION_SPRAY | Freq: Four times a day (QID) | RESPIRATORY_TRACT | 0 refills | Status: DC | PRN

## 2022-01-02 NOTE — Patient Instructions (Signed)
COVID-19: Quarantine and Isolation °Quarantine °If you were exposed °Quarantine and stay away from others when you have been in close contact with someone who has COVID-19. °Isolate °If you are sick or test positive °Isolate when you are sick or when you have COVID-19, even if you don't have symptoms. °When to stay home °Calculating quarantine °The date of your exposure is considered day 0. Day 1 is the first full day after your last contact with a person who has had COVID-19. Stay home and away from other people for at least 5 days. Learn why CDC updated guidance for the general public. °IF YOU were exposed to COVID-19 and are NOT  °up to dateIF YOU were exposed to COVID-19 and are NOT on COVID-19 vaccinations °Quarantine for at least 5 days °Stay home °Stay home and quarantine for at least 5 full days. °Wear a well-fitting mask if you must be around others in your home. °Do not travel. °Get tested °Even if you don't develop symptoms, get tested at least 5 days after you last had close contact with someone with COVID-19. °After quarantine °Watch for symptoms °Watch for symptoms until 10 days after you last had close contact with someone with COVID-19. °Avoid travel °It is best to avoid travel until a full 10 days after you last had close contact with someone with COVID-19. °If you develop symptoms °Isolate immediately and get tested. Continue to stay home until you know the results. Wear a well-fitting mask around others. °Take precautions until day 10 °Wear a well-fitting mask °Wear a well-fitting mask for 10 full days any time you are around others inside your home or in public. Do not go to places where you are unable to wear a well-fitting mask. °If you must travel during days 6-10, take precautions. °Avoid being around people who are more likely to get very sick from COVID-19. °IF YOU were exposed to COVID-19 and are  °up to dateIF YOU were exposed to COVID-19 and are on COVID-19 vaccinations °No  quarantine °You do not need to stay home unless you develop symptoms. °Get tested °Even if you don't develop symptoms, get tested at least 5 days after you last had close contact with someone with COVID-19. °Watch for symptoms °Watch for symptoms until 10 days after you last had close contact with someone with COVID-19. °If you develop symptoms °Isolate immediately and get tested. Continue to stay home until you know the results. Wear a well-fitting mask around others. °Take precautions until day 10 °Wear a well-fitting mask °Wear a well-fitting mask for 10 full days any time you are around others inside your home or in public. Do not go to places where you are unable to wear a well-fitting mask. °Take precautions if traveling °Avoid being around people who are more likely to get very sick from COVID-19. °IF YOU were exposed to COVID-19 and had confirmed COVID-19 within the past 90 days (you tested positive using a viral test) °No quarantine °You do not need to stay home unless you develop symptoms. °Watch for symptoms °Watch for symptoms until 10 days after you last had close contact with someone with COVID-19. °If you develop symptoms °Isolate immediately and get tested. Continue to stay home until you know the results. Wear a well-fitting mask around others. °Take precautions until day 10 °Wear a well-fitting mask °Wear a well-fitting mask for 10 full days any time you are around others inside your home or in public. Do not go to places where you are   unable to wear a well-fitting mask. °Take precautions if traveling °Avoid being around people who are more likely to get very sick from COVID-19. °Calculating isolation °Day 0 is your first day of symptoms or a positive viral test. Day 1 is the first full day after your symptoms developed or your test specimen was collected. If you have COVID-19 or have symptoms, isolate for at least 5 days. °IF YOU tested positive for COVID-19 or have symptoms, regardless of  vaccination status °Stay home for at least 5 days °Stay home for 5 days and isolate from others in your home. °Wear a well-fitting mask if you must be around others in your home. °Do not travel. °Ending isolation if you had symptoms °End isolation after 5 full days if you are fever-free for 24 hours (without the use of fever-reducing medication) and your symptoms are improving. °Ending isolation if you did NOT have symptoms °End isolation after at least 5 full days after your positive test. °If you got very sick from COVID-19 or have a weakened immune system °You should isolate for at least 10 days. Consult your doctor before ending isolation. °Take precautions until day 10 °Wear a well-fitting mask °Wear a well-fitting mask for 10 full days any time you are around others inside your home or in public. Do not go to places where you are unable to wear a well-fitting mask. °Do not travel °Do not travel until a full 10 days after your symptoms started or the date your positive test was taken if you had no symptoms. °Avoid being around people who are more likely to get very sick from COVID-19. °Definitions °Exposure °Contact with someone infected with SARS-CoV-2, the virus that causes COVID-19, in a way that increases the likelihood of getting infected with the virus. °Close contact °A close contact is someone who was less than 6 feet away from an infected person (laboratory-confirmed or a clinical diagnosis) for a cumulative total of 15 minutes or more over a 24-hour period. For example, three individual 5-minute exposures for a total of 15 minutes. People who are exposed to someone with COVID-19 after they completed at least 5 days of isolation are not considered close contacts. °Quarantine °Quarantine is a strategy used to prevent transmission of COVID-19 by keeping people who have been in close contact with someone with COVID-19 apart from others. °Who does not need to quarantine? °If you had close contact with  someone with COVID-19 and you are in one of the following groups, you do not need to quarantine. °You are up to date with your COVID-19 vaccines. °You had confirmed COVID-19 within the last 90 days (meaning you tested positive using a viral test). °If you are up to date with COVID-19 vaccines, you should wear a well-fitting mask around others for 10 days from the date of your last close contact with someone with COVID-19 (the date of last close contact is considered day 0). Get tested at least 5 days after you last had close contact with someone with COVID-19. If you test positive or develop COVID-19 symptoms, isolate from other people and follow recommendations in the Isolation section below. If you tested positive for COVID-19 with a viral test within the previous 90 days and subsequently recovered and remain without COVID-19 symptoms, you do not need to quarantine or get tested after close contact. You should wear a well-fitting mask around others for 10 days from the date of your last close contact with someone with COVID-19 (the date of last   close contact is considered day 0). If you have COVID-19 symptoms, get tested and isolate from other people and follow recommendations in the Isolation section below. °Who should quarantine? °If you come into close contact with someone with COVID-19, you should quarantine if you are not up to date on COVID-19 vaccines. This includes people who are not vaccinated. °What to do for quarantine °Stay home and away from other people for at least 5 days (day 0 through day 5) after your last contact with a person who has COVID-19. The date of your exposure is considered day 0. Wear a well-fitting mask when around others at home, if possible. °For 10 days after your last close contact with someone with COVID-19, watch for fever (100.4°F or greater), cough, shortness of breath, or other COVID-19 symptoms. °If you develop symptoms, get tested immediately and isolate until you receive  your test results. If you test positive, follow isolation recommendations. °If you do not develop symptoms, get tested at least 5 days after you last had close contact with someone with COVID-19. °If you test negative, you can leave your home, but continue to wear a well-fitting mask when around others at home and in public until 10 days after your last close contact with someone with COVID-19. °If you test positive, you should isolate for at least 5 days from the date of your positive test (if you do not have symptoms). If you do develop COVID-19 symptoms, isolate for at least 5 days from the date your symptoms began (the date the symptoms started is day 0). Follow recommendations in the isolation section below. °If you are unable to get a test 5 days after last close contact with someone with COVID-19, you can leave your home after day 5 if you have been without COVID-19 symptoms throughout the 5-day period. Wear a well-fitting mask for 10 days after your date of last close contact when around others at home and in public. °Avoid people who are have weakened immune systems or are more likely to get very sick from COVID-19, and nursing homes and other high-risk settings, until after at least 10 days. °If possible, stay away from people you live with, especially people who are at higher risk for getting very sick from COVID-19, as well as others outside your home throughout the full 10 days after your last close contact with someone with COVID-19. °If you are unable to quarantine, you should wear a well-fitting mask for 10 days when around others at home and in public. °If you are unable to wear a mask when around others, you should continue to quarantine for 10 days. Avoid people who have weakened immune systems or are more likely to get very sick from COVID-19, and nursing homes and other high-risk settings, until after at least 10 days. °See additional information about travel. °Do not go to places where you are  unable to wear a mask, such as restaurants and some gyms, and avoid eating around others at home and at work until after 10 days after your last close contact with someone with COVID-19. °After quarantine °Watch for symptoms until 10 days after your last close contact with someone with COVID-19. °If you have symptoms, isolate immediately and get tested. °Quarantine in high-risk congregate settings °In certain congregate settings that have high risk of secondary transmission (such as correctional and detention facilities, homeless shelters, or cruise ships), CDC recommends a 10-day quarantine for residents, regardless of vaccination and booster status. During periods of critical staffing   shortages, facilities may consider shortening the quarantine period for staff to ensure continuity of operations. Decisions to shorten quarantine in these settings should be made in consultation with state, local, tribal, or territorial health departments and should take into consideration the context and characteristics of the facility. CDC's setting-specific guidance provides additional recommendations for these settings. °Isolation °Isolation is used to separate people with confirmed or suspected COVID-19 from those without COVID-19. People who are in isolation should stay home until it's safe for them to be around others. At home, anyone sick or infected should separate from others, or wear a well-fitting mask when they need to be around others. People in isolation should stay in a specific "sick room" or area and use a separate bathroom if available. Everyone who has presumed or confirmed COVID-19 should stay home and isolate from other people for at least 5 full days (day 0 is the first day of symptoms or the date of the day of the positive viral test for asymptomatic persons). They should wear a mask when around others at home and in public for an additional 5 days. People who are confirmed to have COVID-19 or are showing  symptoms of COVID-19 need to isolate regardless of their vaccination status. This includes: °People who have a positive viral test for COVID-19, regardless of whether or not they have symptoms. °People with symptoms of COVID-19, including people who are awaiting test results or have not been tested. People with symptoms should isolate even if they do not know if they have been in close contact with someone with COVID-19. °What to do for isolation °Monitor your symptoms. If you have an emergency warning sign (including trouble breathing), seek emergency medical care immediately. °Stay in a separate room from other household members, if possible. °Use a separate bathroom, if possible. °Take steps to improve ventilation at home, if possible. °Avoid contact with other members of the household and pets. °Don't share personal household items, like cups, towels, and utensils. °Wear a well-fitting mask when you need to be around other people. °Learn more about what to do if you are sick and how to notify your contacts. °Ending isolation for people who had COVID-19 and had symptoms °If you had COVID-19 and had symptoms, isolate for at least 5 days. To calculate your 5-day isolation period, day 0 is your first day of symptoms. Day 1 is the first full day after your symptoms developed. You can leave isolation after 5 full days. °You can end isolation after 5 full days if you are fever-free for 24 hours without the use of fever-reducing medication and your other symptoms have improved (Loss of taste and smell may persist for weeks or months after recovery and need not delay the end of isolation). °You should continue to wear a well-fitting mask around others at home and in public for 5 additional days (day 6 through day 10) after the end of your 5-day isolation period. If you are unable to wear a mask when around others, you should continue to isolate for a full 10 days. Avoid people who have weakened immune systems or are more  likely to get very sick from COVID-19, and nursing homes and other high-risk settings, until after at least 10 days. °If you continue to have fever or your other symptoms have not improved after 5 days of isolation, you should wait to end your isolation until you are fever-free for 24 hours without the use of fever-reducing medication and your other symptoms have improved.   Continue to wear a well-fitting mask through day 10. Contact your healthcare provider if you have questions. °See additional information about travel. °Do not go to places where you are unable to wear a mask, such as restaurants and some gyms, and avoid eating around others at home and at work until a full 10 days after your first day of symptoms. °If an individual has access to a test and wants to test, the best approach is to use an antigen test1 towards the end of the 5-day isolation period. Collect the test sample only if you are fever-free for 24 hours without the use of fever-reducing medication and your other symptoms have improved (loss of taste and smell may persist for weeks or months after recovery and need not delay the end of isolation). If your test result is positive, you should continue to isolate until day 10. If your test result is negative, you can end isolation, but continue to wear a well-fitting mask around others at home and in public until day 10. Follow additional recommendations for masking and avoiding travel as described above. °1As noted in the labeling for authorized over-the counter antigen tests: Negative results should be treated as presumptive. Negative results do not rule out SARS-CoV-2 infection and should not be used as the sole basis for treatment or patient management decisions, including infection control decisions. To improve results, antigen tests should be used twice over a three-day period with at least 24 hours and no more than 48 hours between tests. °Note that these recommendations on ending isolation  do not apply to people who are moderately ill or very sick from COVID-19 or have weakened immune systems. See section below for recommendations for when to end isolation for these groups. °Ending isolation for people who tested positive for COVID-19 but had no symptoms °If you test positive for COVID-19 and never develop symptoms, isolate for at least 5 days. Day 0 is the day of your positive viral test (based on the date you were tested) and day 1 is the first full day after the specimen was collected for your positive test. You can leave isolation after 5 full days. °If you continue to have no symptoms, you can end isolation after at least 5 days. °You should continue to wear a well-fitting mask around others at home and in public until day 10 (day 6 through day 10). If you are unable to wear a mask when around others, you should continue to isolate for 10 days. Avoid people who have weakened immune systems or are more likely to get very sick from COVID-19, and nursing homes and other high-risk settings, until after at least 10 days. °If you develop symptoms after testing positive, your 5-day isolation period should start over. Day 0 is your first day of symptoms. Follow the recommendations above for ending isolation for people who had COVID-19 and had symptoms. °See additional information about travel. °Do not go to places where you are unable to wear a mask, such as restaurants and some gyms, and avoid eating around others at home and at work until 10 days after the day of your positive test. °If an individual has access to a test and wants to test, the best approach is to use an antigen test1 towards the end of the 5-day isolation period. If your test result is positive, you should continue to isolate until day 10. If your test result is positive, you can also choose to test daily and if your test result   is negative, you can end isolation, but continue to wear a well-fitting mask around others at home and in  public until day 10. Follow additional recommendations for masking and avoiding travel as described above. °1As noted in the labeling for authorized over-the counter antigen tests: Negative results should be treated as presumptive. Negative results do not rule out SARS-CoV-2 infection and should not be used as the sole basis for treatment or patient management decisions, including infection control decisions. To improve results, antigen tests should be used twice over a three-day period with at least 24 hours and no more than 48 hours between tests. °Ending isolation for people who were moderately or very sick from COVID-19 or have a weakened immune system °People who are moderately ill from COVID-19 (experiencing symptoms that affect the lungs like shortness of breath or difficulty breathing) should isolate for 10 days and follow all other isolation precautions. To calculate your 10-day isolation period, day 0 is your first day of symptoms. Day 1 is the first full day after your symptoms developed. If you are unsure if your symptoms are moderate, talk to a healthcare provider for further guidance. °People who are very sick from COVID-19 (this means people who were hospitalized or required intensive care or ventilation support) and people who have weakened immune systems might need to isolate at home longer. They may also require testing with a viral test to determine when they can be around others. CDC recommends an isolation period of at least 10 and up to 20 days for people who were very sick from COVID-19 and for people with weakened immune systems. Consult with your healthcare provider about when you can resume being around other people. If you are unsure if your symptoms are severe or if you have a weakened immune system, talk to a healthcare provider for further guidance. °People who have a weakened immune system should talk to their healthcare provider about the potential for reduced immune responses to  COVID-19 vaccines and the need to continue to follow current prevention measures (including wearing a well-fitting mask and avoiding crowds and poorly ventilated indoor spaces) to protect themselves against COVID-19 until advised otherwise by their healthcare provider. Close contacts of immunocompromised people--including household members--should also be encouraged to receive all recommended COVID-19 vaccine doses to help protect these people. °Isolation in high-risk congregate settings °In certain high-risk congregate settings that have high risk of secondary transmission and where it is not feasible to cohort people (such as correctional and detention facilities, homeless shelters, and cruise ships), CDC recommends a 10-day isolation period for residents. During periods of critical staffing shortages, facilities may consider shortening the isolation period for staff to ensure continuity of operations. Decisions to shorten isolation in these settings should be made in consultation with state, local, tribal, or territorial health departments and should take into consideration the context and characteristics of the facility. CDC's setting-specific guidance provides additional recommendations for these settings. °This CDC guidance is meant to supplement--not replace--any federal, state, local, territorial, or tribal health and safety laws, rules, and regulations. °Recommendations for specific settings °These recommendations do not apply to healthcare professionals. For guidance specific to these settings, see °Healthcare professionals: Interim Guidance for Managing Healthcare Personnel with SARS-CoV-2 Infection or Exposure to SARS-CoV-2 °Patients, residents, and visitors to healthcare settings: Interim Infection Prevention and Control Recommendations for Healthcare Personnel During the Coronavirus Disease 2019 (COVID-19) Pandemic °Additional setting-specific guidance and recommendations are available. °These  recommendations on quarantine and isolation do apply to K[employer]12 School   settings. Additional guidance is available here: Overview of COVID-19 Quarantine for K-12 Schools °Travelers: Travel information and recommendations °Congregate facilities and other settings: guidance pages for community, work, and school settings °Ongoing COVID-19 exposure FAQs °I live with someone with COVID-19, but I cannot be separated from them. How do we manage quarantine in this situation? °It is very important for people with COVID-19 to remain apart from other people, if possible, even if they are living together. If separation of the person with COVID-19 from others that they live with is not possible, the other people that they live with will have ongoing exposure, meaning they will be repeatedly exposed until that person is no longer able to spread the virus to other people. In this situation, there are precautions you can take to limit the spread of COVID-19: °The person with COVID-19 and everyone they live with should wear a well-fitting mask inside the home. °If possible, one person should care for the person with COVID-19 to limit the number of people who are in close contact with the infected person. °Take steps to protect yourself and others to reduce transmission in the home: °Quarantine if you are not up to date with your COVID-19 vaccines. °Isolate if you are sick or tested positive for COVID-19, even if you don't have symptoms. °Learn more about the public health recommendations for testing, mask use and quarantine of close contacts, like yourself, who have ongoing exposure. These recommendations differ depending on your vaccination status. °What should I do if I have ongoing exposure to COVID-19 from someone I live with? °Recommendations for this situation depend on your vaccination status: °If you are not up to date on COVID-19 vaccines and have ongoing exposure to COVID-19, you should: °Begin quarantine immediately and  continue to quarantine throughout the isolation period of the person with COVID-19. °Continue to quarantine for an additional 5 days starting the day after the end of isolation for the person with COVID-19. °Get tested at least 5 days after the end of isolation of the infected person that lives with them. °If you test negative, you can leave the home but should continue to wear a well-fitting mask when around others at home and in public until 10 days after the end of isolation for the person with COVID-19. °Isolate immediately if you develop symptoms of COVID-19 or test positive. °If you are up to date with COVID-19 vaccines and have ongoing exposure to COVID-19, you should: °Get tested at least 5 days after your first exposure. A person with COVID-19 is considered infectious starting 2 days before they develop symptoms, or 2 days before the date of their positive test if they do not have symptoms. °Get tested again at least 5 days after the end of isolation for the person with COVID-19. °Wear a well-fitting mask when you are around the person with COVID-19, and do this throughout their isolation period. °Wear a well-fitting mask around others for 10 days after the infected person's isolation period ends. °Isolate immediately if you develop symptoms of COVID-19 or test positive. °What should I do if multiple people I live with test positive for COVID-19 at different times? °Recommendations for this situation depend on your vaccination status: °If you are not up to date with your COVID-19 vaccines, you should: °Quarantine throughout the isolation period of any infected person that you live with. °Continue to quarantine until 5 days after the end of isolation date for the most recently infected person that lives with you. For example, if   the last day of isolation of the person most recently infected with COVID-19 was June 30, the new 5-day quarantine period starts on July 1. °Get tested at least 5 days after the end  of isolation for the most recently infected person that lives with you. °Wear a well-fitting mask when you are around any person with COVID-19 while that person is in isolation. °Wear a well-fitting mask when you are around other people until 10 days after your last close contact. °Isolate immediately if you develop symptoms of COVID-19 or test positive. °If you are up to date with your COVID-19 vaccines, you should: °Get tested at least 5 days after your first exposure. A person with COVID-19 is considered infectious starting 2 days before they developed symptoms, or 2 days before the date of their positive test if they do not have symptoms. °Get tested again at least 5 days after the end of isolation for the most recently infected person that lives with you. °Wear a well-fitting mask when you are around any person with COVID-19 while that person is in isolation. °Wear a well-fitting mask around others for 10 days after the end of isolation for the most recently infected person that lives with you. For example, if the last day of isolation for the person most recently infected with COVID-19 was June 30, the new 10-day period to wear a well-fitting mask indoors in public starts on July 1. °Isolate immediately if you develop symptoms of COVID-19 or test positive. °I had COVID-19 and completed isolation. Do I have to quarantine or get tested if someone I live with gets COVID-19 shortly after I completed isolation? °No. If you recently completed isolation and someone that lives with you tests positive for the virus that causes COVID-19 shortly after the end of your isolation period, you do not have to quarantine or get tested as long as you do not develop new symptoms. Once all of the people that live together have completed isolation or quarantine, refer to the guidance below for new exposures to COVID-19. °If you had COVID-19 in the previous 90 days and then came into close contact with someone with COVID-19, you do  not have to quarantine or get tested if you do not have symptoms. But you should: °Wear a well-fitting mask indoors in public for 10 days after your last close contact. °Monitor for COVID-19 symptoms for 10 days from the date of your last close contact. °Isolate immediately and get tested if symptoms develop. °If more than 90 days have passed since your recovery from infection, follow CDC's recommendations for close contacts. These recommendations will differ depending on your vaccination status. °03/26/2021 °Content source: National Center for Immunization and Respiratory Diseases (NCIRD), Division of Viral Diseases °This information is not intended to replace advice given to you by your health care provider. Make sure you discuss any questions you have with your health care provider. °Document Revised: 07/30/2021 Document Reviewed: 07/30/2021 °Elsevier Patient Education © 2022 [employer]vier Inc. ° °

## 2022-01-02 NOTE — Progress Notes (Signed)
Virtual Visit Consent   Joan Russo, you are scheduled for a virtual visit with Mary-Margaret Hassell Done, [place], a [facility] provider, today.     Just as with appointments in the office, your consent must be obtained to participate.  Your consent will be active for this visit and any virtual visit you may have with one of our providers in the next 365 days.     If you have a MyChart account, a copy of this consent can be sent to you electronically.  All virtual visits are billed to your insurance company just like a traditional visit in the office.    As this is a virtual visit, video technology does not allow for your provider to perform a traditional examination.  This may limit your provider's ability to fully assess your condition.  If your provider identifies any concerns that need to be evaluated in person or the need to arrange testing (such as labs, EKG, etc.), we will make arrangements to do so.     Although advances in technology are sophisticated, we cannot ensure that it will always work on either your end or our end.  If the connection with a video visit is poor, the visit may have to be switched to a telephone visit.  With either a video or telephone visit, we are not always able to ensure that we have a secure connection.     I need to obtain your verbal consent now.   Are you willing to proceed with your visit today? YES   HONESTIE KULIK has provided verbal consent on 01/02/2022 for a virtual visit (video or telephone).   Mary-Margaret Hassell Done, FNP   Date: 01/02/2022 6:53 PM   Virtual Visit via Video Note   I, Mary-Margaret Hassell Done, connected with KEYANNA SANDEFER ([id number], 1956-12-08) on 01/02/22 at  7:00 PM EST by a video-enabled telemedicine application and verified that I am speaking with the correct person using two identifiers.  Location: Patient: Virtual Visit Location Patient: Home Provider: Virtual Visit Location Provider: Mobile   I discussed the  limitations of evaluation and management by telemedicine and the availability of in person appointments. The patient expressed understanding and agreed to proceed.    History of Present Illness: DAYAN KREIS is a 66 y.o. who identifies as a female who was assigned female at birth, and is being seen today for covid positive.  HPI: URI  This is a new problem. The current episode started in the past 7 days. The problem has been gradually worsening. The maximum temperature recorded prior to her arrival was 101 - 101.9 F. The fever has been present for 1 to 2 days. Associated symptoms include congestion, coughing, headaches, rhinorrhea and a sore throat. She has tried decongestant for the symptoms. The treatment provided mild relief.   Review of Systems  HENT:  Positive for congestion, rhinorrhea and sore throat.   Respiratory:  Positive for cough.   Neurological:  Positive for headaches.   Problems:  Patient Active Problem List   Diagnosis Date Noted   Bloating 12/03/2021   Abdominal discomfort 12/03/2021   Dysuria 12/03/2021   Other proteinuria 12/03/2021   Memory changes 12/17/2020   Vertigo 12/17/2020   Prediabetes 12/17/2020   Insect bite (nonvenomous) of left shoulder, initial encounter 09/05/2020   Degenerative joint disease of knee, right 03/30/2020   History of COVID-19 03/12/2020   Neuropathic pain 12/15/2016   Hiatal hernia    Esophageal web    History  of colonic polyps    Fibromyalgia 12/06/2015   OSA (obstructive sleep apnea) 02/19/2015   Elevated C-reactive protein (CRP) 01/31/2015   Elevated sed rate 01/31/2015   HTN (hypertension) 01/31/2015   Hypothyroidism 01/08/2015   Osteopenia 93/57/0177   Lichen sclerosus of female genitalia 12/24/2014   Polyarthralgia 12/01/2011   BMI 50.0-59.9, adult (HCC)    Depression    Vitamin D deficiency     Allergies: No Known Allergies Medications:  Current Outpatient Medications:    ALPRAZolam (XANAX) 0.5 MG tablet, Take  1 tablet (0.5 mg total) by mouth at bedtime as needed for anxiety., Disp: 20 tablet, Rfl: 0   buPROPion (WELLBUTRIN SR) 150 MG 12 hr tablet, Take 1 tab PO in am, 2 tabs PO in pm, Disp: 270 tablet, Rfl: 1   clobetasol cream (TEMOVATE) 9.39 %, Apply 1 application topically 2 (two) times daily. X 6 wks, then drop to once daily x 6 wks, then 2x/wk, Disp: 30 g, Rfl: 5   famotidine (PEPCID) 40 MG tablet, TAKE 1 TABLET BY MOUTH EVERYDAY AT BEDTIME, Disp: 90 tablet, Rfl: 0   fluticasone (FLONASE) 50 MCG/ACT nasal spray, Place 2 sprays into both nostrils daily., Disp: 16 g, Rfl: 6   gabapentin (NEURONTIN) 300 MG capsule, Take 1 capsule by mouth 2 (two) times daily., Disp: , Rfl:    ibuprofen (ADVIL) 600 MG tablet, Take 1 tablet (600 mg total) by mouth every 6 (six) hours as needed. (Patient taking differently: Take 600 mg by mouth every 6 (six) hours as needed for headache.), Disp: 60 tablet, Rfl: 3   levothyroxine (SYNTHROID) 50 MCG tablet, Take 1 tablet (50 mcg total) by mouth daily., Disp: 90 tablet, Rfl: 3   meclizine (ANTIVERT) 12.5 MG tablet, Take 1 tablet (12.5 mg total) by mouth daily as needed for dizziness., Disp: 15 tablet, Rfl: 0   Multiple Vitamin (MULTIVITAMIN WITH MINERALS) TABS tablet, Take 1 tablet by mouth daily., Disp: , Rfl:    omeprazole (PRILOSEC) 20 MG capsule, Take 1 capsule (20 mg total) by mouth daily., Disp: 30 capsule, Rfl: 0   pregabalin (LYRICA) 75 MG capsule, TAKE 1 CAPSULE BY MOUTH TWICE A DAY, Disp: 60 capsule, Rfl: 3   venlafaxine (EFFEXOR) 37.5 MG tablet, Take 37.5 mg by mouth 2 (two) times daily., Disp: , Rfl:   Observations/Objective: Patient is well-developed, well-nourished in no acute distress.  Resting comfortably  at home.  Head is normocephalic, atraumatic.  No labored breathing.  Speech is clear and coherent with logical content.  Patient is alert and oriented at baseline.  Constant cough Nasal congestion 'face flushed Sob when talking  Assessment and  Plan:  Joan Russo in today with chief complaint of Covid Positive   1. Lab test positive for detection of COVID-19 virus 1. Take meds as prescribed 2. Use a cool mist humidifier especially during the winter months and when heat has been humid. 3. Use saline nose sprays frequently 4. Saline irrigations of the nose can be very helpful if done frequently.  * 4X daily for 1 week*  * Use of a nettie pot can be helpful with this. Follow directions with this* 5. Drink plenty of fluids 6. Keep thermostat turn down low 7.For any cough or congestion- musinex OTC 8. For fever or aces or pains- take tylenol or ibuprofen appropriate for age and weight.  * for fevers greater than 101 orally you may alternate ibuprofen and tylenol every  3 hours.   If sob worsens then go  to the ED  Meds ordered this encounter  Medications   albuterol (VENTOLIN HFA) 108 (90 Base) MCG/ACT inhaler    Sig: Inhale 2 puffs into the lungs every 6 (six) hours as needed for wheezing or shortness of breath.    Dispense:  8 g    Refill:  0    Order Specific Question:   Supervising Provider    Answer:   MILLER, BRIAN [[id number]]   molnupiravir EUA (LAGEVRIO) 200 mg CAPS capsule    Sig: Take 4 capsules (800 mg total) by mouth 2 (two) times daily for 5 days.    Dispense:  40 capsule    Refill:  0    Order Specific Question:   Supervising Provider    Answer:   Noemi Chapel [[id number]]       Follow Up Instructions: I discussed the assessment and treatment plan with the patient. The patient was provided an opportunity to ask questions and all were answered. The patient agreed with the plan and demonstrated an understanding of the instructions.  A copy of instructions were sent to the patient via MyChart.  The patient was advised to call back or seek an in-person evaluation if the symptoms worsen or if the condition fails to improve as anticipated.  Time:  I spent 8 minutes with the patient via telehealth technology  discussing the above problems/concerns.    Mary-Margaret Hassell Done, FNP

## 2022-01-07 ENCOUNTER — Other Ambulatory Visit: Payer: Medicare Other

## 2022-01-07 ENCOUNTER — Inpatient Hospital Stay: Admission: RE | Admit: 2022-01-07 | Payer: Medicare Other | Source: Ambulatory Visit

## 2022-01-25 ENCOUNTER — Other Ambulatory Visit: Payer: Self-pay | Admitting: Nurse Practitioner

## 2022-01-28 DIAGNOSIS — L738 Other specified follicular disorders: Secondary | ICD-10-CM | POA: Diagnosis not present

## 2022-01-28 DIAGNOSIS — L700 Acne vulgaris: Secondary | ICD-10-CM | POA: Diagnosis not present

## 2022-01-30 ENCOUNTER — Ambulatory Visit
Admission: RE | Admit: 2022-01-30 | Discharge: 2022-01-30 | Disposition: A | Discharge status: Home / Self Care | Payer: PRIVATE HEALTH INSURANCE | Source: Ambulatory Visit | Attending: Obstetrics and Gynecology | Admitting: Obstetrics and Gynecology

## 2022-01-30 ENCOUNTER — Other Ambulatory Visit: Payer: Self-pay

## 2022-01-30 DIAGNOSIS — R921 Mammographic calcification found on diagnostic imaging of breast: Secondary | ICD-10-CM | POA: Diagnosis not present

## 2022-01-30 DIAGNOSIS — R928 Other abnormal and inconclusive findings on diagnostic imaging of breast: Secondary | ICD-10-CM | POA: Insufficient documentation

## 2022-01-30 DIAGNOSIS — R922 Inconclusive mammogram: Secondary | ICD-10-CM | POA: Diagnosis not present

## 2022-02-19 ENCOUNTER — Other Ambulatory Visit: Payer: Self-pay

## 2022-02-19 ENCOUNTER — Telehealth (INDEPENDENT_AMBULATORY_CARE_PROVIDER_SITE_OTHER): Payer: Medicare HMO | Admitting: Nurse Practitioner

## 2022-02-19 DIAGNOSIS — F322 Major depressive disorder, single episode, severe without psychotic features: Secondary | ICD-10-CM | POA: Diagnosis not present

## 2022-02-19 DIAGNOSIS — M792 Neuralgia and neuritis, unspecified: Secondary | ICD-10-CM

## 2022-02-19 NOTE — Assessment & Plan Note (Signed)
And gabapentin.  Patient wanted to come off gabapentin wean yourself off.  States has been off gabapentin for couple weeks now and doing well..  Continue Lyrica as prescribed

## 2022-02-19 NOTE — Assessment & Plan Note (Signed)
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  Nowadays her doing longer is getting warmer she titrated herself off of Wellbutrin 150 mg total of 3 tablets a day.  PHQ-9 within normal limits today.  Did instruct patient if she has any change or any increase in depressive symptoms reach back out to the office.

## 2022-02-19 NOTE — Progress Notes (Signed)
Patient ID: Joan Russo, female    DOB: [birth year], 66 y.o.   MRN: [id number]  Virtual visit completed through [employer], a video enabled telemedicine application. Due to national recommendations of social distancing due to COVID-19, a virtual visit is felt to be most appropriate for this patient at this time. Reviewed limitations, risks, security and privacy concerns of performing a virtual visit and the availability of in person appointments. I also reviewed that there may be a patient responsible charge related to this service. The patient agreed to proceed.   Patient location: home Provider location: [facility], office Persons participating in this virtual visit: patient, provider   If any vitals were documented, they were collected by patient at home unless specified below.    There were no vitals taken for this visit.   CC: Medication questions Subjective:   HPI: Joan Russo is a 66 y.o. female presenting on 02/19/2022 for Discuss medication (Discuss stopping some of the medications if possible-would like to discuss stopping Bupropion and Gabapentin.) and Shoulder Pain (Patient states she thinks this is due to fibromyalgia-but at the end of the day both shoulder area/pressure points are so sore. Someone told her she could cut up lidocaine patches and apply to the area to help with this. Patient would like to know if its ok to try this or should she ask her ortho about this.)   Medication changes: patient states that she would like to come off some medications. States that she was seen at a neurologist office and was written gabapentin written by Luella Cook.  Patient was on gabapentin 300 mg twice daily along with Lyrica 75 mg twice daily.  Patient states she has been off of gabapentin for couple weeks and feels fine.  States ortho surgeon prescribed the Wellbutrin in the past, currently on 1 QAM and 2 QPM. States that she reduced the buprioprion and has not  had it in a few weeks. States that she feels fine. 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 States that signed up with an exercise class with [facility] and states that she feels good.  PHQ-9 administered via telephone.  She was complaining of bilateral posterior shoulder muscle pain.  States some of her friends had talked about using lidocaine patches and she was unsure about that.  Did tell her is okay to use lidocaine patches told her to follow instructions on the box to wear patches 12 hours on 12 hours off.  Patient can follow-up if no improvement or talk to her orthopedist    PHQ9 SCORE ONLY 02/19/2022 09/17/2021 06/04/2021  PHQ-9 Total Score 2 21 0    GAD 7 : Generalized Anxiety Score 09/17/2021  Nervous, Anxious, on Edge 2  Control/stop worrying 0  Worry too much - different things 0  Trouble relaxing 1  Restless 1  Easily annoyed or irritable 0  Afraid - awful might happen 1  Total GAD 7 Score 5  Anxiety Difficulty Somewhat difficult      Relevant past medical, surgical, family and social history reviewed and updated as indicated. Interim medical history since our last visit reviewed. Allergies and medications reviewed and updated. Outpatient Medications Prior to Visit  Medication Sig Dispense Refill   ALPRAZolam (XANAX) 0.5 MG tablet Take 1 tablet (0.5 mg total) by mouth at bedtime as needed for anxiety. 20 tablet 0   clobetasol cream (TEMOVATE) 2.83 % Apply 1 application topically 2 (two) times daily. X 6 wks, then drop to once  daily x 6 wks, then 2x/wk 30 g 5   fluticasone (FLONASE) 50 MCG/ACT nasal spray Place 2 sprays into both nostrils daily. 16 g 6   ibuprofen (ADVIL) 600 MG tablet Take 1 tablet (600 mg total) by mouth every 6 (six) hours as needed. (Patient taking differently: Take 600 mg by mouth every 6 (six) hours as needed for headache.) 60 tablet 3   levothyroxine (SYNTHROID) 50 MCG tablet Take 1 tablet (50 mcg total) by mouth daily. 90 tablet 3   Multiple Vitamin (MULTIVITAMIN WITH  MINERALS) TABS tablet Take 1 tablet by mouth daily.     NON FORMULARY CBD GUMMIES -to help with knee pain     pregabalin (LYRICA) 75 MG capsule TAKE 1 CAPSULE BY MOUTH TWICE A DAY 60 capsule 3   Probiotic Product (PROBIOTIC-10 PO) Take by mouth.     venlafaxine (EFFEXOR) 37.5 MG tablet Take 37.5 mg by mouth 2 (two) times daily.     buPROPion (WELLBUTRIN SR) 150 MG 12 hr tablet Take 1 tab PO in am, 2 tabs PO in pm (Patient not taking: Reported on 02/19/2022) 270 tablet 1   gabapentin (NEURONTIN) 300 MG capsule Take 1 capsule by mouth 2 (two) times daily. (Patient not taking: Reported on 02/19/2022)     albuterol (VENTOLIN HFA) 108 (90 Base) MCG/ACT inhaler Inhale 2 puffs into the lungs every 6 (six) hours as needed for wheezing or shortness of breath. 8 g 0   famotidine (PEPCID) 40 MG tablet TAKE 1 TABLET BY MOUTH EVERYDAY AT BEDTIME 90 tablet 0   meclizine (ANTIVERT) 12.5 MG tablet Take 1 tablet (12.5 mg total) by mouth daily as needed for dizziness. 15 tablet 0   omeprazole (PRILOSEC) 20 MG capsule Take 1 capsule (20 mg total) by mouth daily. 30 capsule 0   No facility-administered medications prior to visit.     Per HPI unless specifically indicated in ROS section below Review of Systems  Constitutional:  Negative for chills and fever.  Musculoskeletal:  Positive for arthralgias and myalgias.  Psychiatric/Behavioral:  Negative for suicidal ideas.   Objective:  There were no vitals taken for this visit.  Wt Readings from Last 3 Encounters:  12/03/21 266 lb 2 oz (120.7 kg)  10/28/21 270 lb 9.6 oz (122.7 kg)  09/17/21 277 lb 8 oz (125.9 kg)       Physical exam: Gen: alert, NAD, not ill appearing Pulm: speaks in complete sentences without increased work of breathing Psych: normal mood, normal thought content      Results for orders placed or performed in visit on 12/03/21  CBC with Differential/Platelet  Result Value Ref Range   WBC 8.4 4.0 - 10.5 K/uL   RBC 4.98 3.87 - 5.11 Mil/uL    Hemoglobin 14.8 12.0 - 15.0 g/dL   HCT 44.5 36.0 - 46.0 %   MCV 89.3 78.0 - 100.0 fl   MCHC 33.4 30.0 - 36.0 g/dL   RDW 14.4 11.5 - 15.5 %   Platelets 243.0 150.0 - 400.0 K/uL   Neutrophils Relative % 40.3 (L) 43.0 - 77.0 %   Lymphocytes Relative 47.4 (H) 12.0 - 46.0 %   Monocytes Relative 7.3 3.0 - 12.0 %   Eosinophils Relative 4.4 0.0 - 5.0 %   Basophils Relative 0.6 0.0 - 3.0 %   Neutro Abs 3.4 1.4 - 7.7 K/uL   Lymphs Abs 4.0 0.7 - 4.0 K/uL   Monocytes Absolute 0.6 0.1 - 1.0 K/uL   Eosinophils Absolute 0.4 0.0 -  0.7 K/uL   Basophils Absolute 0.0 0.0 - 0.1 K/uL  Lipase  Result Value Ref Range   Lipase 18.0 11.0 - 59.0 U/L  Comprehensive metabolic panel  Result Value Ref Range   Sodium 140 135 - 145 mEq/L   Potassium 4.5 3.5 - 5.1 mEq/L   Chloride 105 96 - 112 mEq/L   CO2 29 19 - 32 mEq/L   Glucose, Bld 88 70 - 99 mg/dL   BUN 14 6 - 23 mg/dL   Creatinine, Ser 1.06 0.40 - 1.20 mg/dL   Total Bilirubin 0.5 0.2 - 1.2 mg/dL   Alkaline Phosphatase 81 39 - 117 U/L   AST 20 0 - 37 U/L   ALT 15 0 - 35 U/L   Total Protein 7.3 6.0 - 8.3 g/dL   Albumin 4.3 3.5 - 5.2 g/dL   GFR 54.30 (L) >60.00 mL/min   Calcium 10.0 8.4 - 10.5 mg/dL  Microalbumin/Creatinine Ratio, Urine  Result Value Ref Range   Microalb, Ur 0.8 0.0 - 1.9 mg/dL   Creatinine,U 156.0 mg/dL   Microalb Creat Ratio 0.5 0.0 - 30.0 mg/g  POCT URINALYSIS DIP (CLINITEK)  Result Value Ref Range   Color, UA orange (A) yellow   Clarity, UA hazy (A) clear   Glucose, UA negative negative mg/dL   Bilirubin, UA negative negative   Ketones, POC UA negative negative mg/dL   Spec Grav, UA >=1.030 (A) 1.010 - 1.025   Blood, UA negative negative   pH, UA 5.5 5.0 - 8.0   POC PROTEIN,UA trace negative, trace   Urobilinogen, UA 0.2 0.2 or 1.0 E.U./dL   Nitrite, UA Negative Negative   Leukocytes, UA Negative Negative   Assessment & Plan:   Problem List Items Addressed This Visit       Other   Depression    Patient states  that she does have seasonal affective disorder.  Nowadays her doing longer is getting warmer she titrated herself off of Wellbutrin 150 mg total of 3 tablets a day.  PHQ-9 within normal limits today.  Did instruct patient if she has any change or any increase in depressive symptoms reach back out to the office.      Neuropathic pain    And gabapentin.  Patient wanted to come off gabapentin wean yourself off.  States has been off gabapentin for couple weeks now and doing well..  Continue Lyrica as prescribed        No orders of the defined types were placed in this encounter.  No orders of the defined types were placed in this encounter.   I discussed the assessment and treatment plan with the patient. The patient was provided an opportunity to ask questions and all were answered. The patient agreed with the plan and demonstrated an understanding of the instructions. The patient was advised to call back or seek an in-person evaluation if the symptoms worsen or if the condition fails to improve as anticipated.  Follow up plan: No follow-ups on file.  Romilda Garret, NP

## 2022-03-11 ENCOUNTER — Encounter: Payer: Self-pay | Admitting: Nurse Practitioner

## 2022-03-30 ENCOUNTER — Other Ambulatory Visit: Payer: Self-pay | Admitting: Nurse Practitioner

## 2022-03-30 DIAGNOSIS — F322 Major depressive disorder, single episode, severe without psychotic features: Secondary | ICD-10-CM

## 2022-03-30 NOTE — Progress Notes (Signed)
? ?I, Wendy Poet, LAT, ATC, am serving as scribe for Dr. [name]. ? ?Joan Russo is a 66 y.o. 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  She was last seen by Dr. Georgina Snell on 07/16/21 and had a R knee steroid injection.  Today, pt reports increased R knee pain since Nov 2022 and L hip pain x approximately 6 months.  She denies any L hip injury but does note having a prior L knee TKR.  She locates her L hip pain to her L lateral hip and notes increased pain both w/ activity and at rest. ? ?Dx imaging: L hip XR- 11/26/20; 07/30/20 R knee XR ? ?Pertinent review of systems: No fevers or chills ? ?Relevant historical information: Hypertension.  Obesity.  Osteopenia.  Left knee replacement. ? ? ?Exam:  ?BP 132/78 (BP Location: Left Arm, Patient Position: Sitting, Cuff Size: Large)   Pulse 66   Ht '4\' 10"'$  (1.473 m)   Wt 262 lb (118.8 kg)   SpO2 95%   BMI 54.76 kg/m?  ?General: Well Developed, well nourished, and in no acute distress.  ? ?MSK: Right knee mild effusion.  ? Decreased range of motion.  ?Tender palpation medial joint line. ?Stable ligamentous exam. ? ?Left hip normal. ?Tender palpation lateral hip and greater trochanter. ?Decreased hip strength to abduction. ? ? ? ? ?Lab and Radiology Results ? ?Procedure: Real-time Ultrasound Guided Injection of right knee superior lateral patellar space ?Device: Philips Affiniti 50G ?Images permanently stored and available for review in PACS ?Verbal informed consent obtained.  Discussed risks and benefits of procedure. Warned about infection, bleeding, hyperglycemia damage to structures among others. ?Patient expresses understanding and agreement ?Time-out conducted.   ?Noted no overlying erythema, induration, or other signs of local infection.   ?Skin prepped in a sterile fashion.   ?Local anesthesia: Topical Ethyl chloride.   ?With sterile technique and under real time ultrasound  guidance: 40 mg of Kenalog and 2 mL of Marcaine injected into knee joint. Fluid seen entering the capsule.   ?Completed without difficulty   ?Pain immediately resolved suggesting accurate placement of the medication.   ?Advised to call if fevers/chills, erythema, induration, drainage, or persistent bleeding.   ?Images permanently stored and available for review in the ultrasound unit.  ?Impression: Technically successful ultrasound guided injection. ? ? ?Procedure: Real-time Ultrasound Guided Injection of left hip greater trochanter bursa ?Device: Philips Affiniti 50G ?Images permanently stored and available for review in PACS ?Verbal informed consent obtained.  Discussed risks and benefits of procedure. Warned about infection, bleeding, hyperglycemia damage to structures among others. ?Patient expresses understanding and agreement ?Time-out conducted.   ?Noted no overlying erythema, induration, or other signs of local infection.   ?Skin prepped in a sterile fashion.   ?Local anesthesia: Topical Ethyl chloride.   ?With sterile technique and under real time ultrasound guidance: 40 mg of Kenalog and 2 mL of Marcaine injected into the trochanteric bursa. Fluid seen entering the bursa.   ?Completed without difficulty   ?Pain immediately resolved suggesting accurate placement of the medication.   ?Advised to call if fevers/chills, erythema, induration, drainage, or persistent bleeding.   ?Images permanently stored and available for review in the ultrasound unit.  ?Impression: Technically successful ultrasound guided injection. ? ? ? ? ?Assessment and Plan: ?66 y.o. 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  Plan for steroid injection today.  Ultimately  she will benefit from a knee replacement but her BMI of 54 will prevent that for now.  Weight loss will help.  Recheck back as needed.  Certainly could proceed with hyaluronic acid injection if the steroid injection does not last long enough. ? ?Lateral hip pain  due to trochanteric bursitis.  Plan for steroid injection and hip abductor strengthening.  Recheck back as needed. ? ? ?PDMP not reviewed this encounter. ?Orders Placed This Encounter  ?Procedures  ? [place] LIMITED JOINT SPACE STRUCTURES LOW RIGHT(NO LINKED CHARGES)  ?  Order Specific Question:   Reason for Exam (SYMPTOM  OR DIAGNOSIS REQUIRED)  ?  Answer:   R knee pain  ?  Order Specific Question:   Preferred imaging location?  ?  Answer:   [place]  ? ?No orders of the defined types were placed in this encounter. ? ? ? ?Discussed warning signs or symptoms. Please see discharge instructions. Patient expresses understanding. ? ? ?The above documentation has been reviewed and is accurate and complete [name], M.D. ? ? ? ?

## 2022-03-31 ENCOUNTER — Ambulatory Visit: Payer: Medicare HMO | Admitting: Family Medicine

## 2022-03-31 ENCOUNTER — Ambulatory Visit: Payer: Self-pay

## 2022-03-31 ENCOUNTER — Encounter: Payer: Self-pay | Admitting: Family Medicine

## 2022-03-31 VITALS — BP 132/78 | HR 66 | Ht <= 58 in | Wt 262.0 lb

## 2022-03-31 DIAGNOSIS — G8929 Other chronic pain: Secondary | ICD-10-CM | POA: Diagnosis not present

## 2022-03-31 DIAGNOSIS — M1711 Unilateral primary osteoarthritis, right knee: Secondary | ICD-10-CM | POA: Diagnosis not present

## 2022-03-31 DIAGNOSIS — M25552 Pain in left hip: Secondary | ICD-10-CM

## 2022-03-31 DIAGNOSIS — M25561 Pain in right knee: Secondary | ICD-10-CM | POA: Diagnosis not present

## 2022-03-31 NOTE — Patient Instructions (Addendum)
Good to see you today. ? ?You had a R knee and a L hip (GT) injection.  Call or go to the ER if you develop a large red swollen joint with extreme pain or oozing puss.  ? ?Enjoy the wedding! ? ?Follow-up: as needed.  I can repeat injections every 3 months as needed. ?

## 2022-04-16 ENCOUNTER — Other Ambulatory Visit: Payer: Self-pay | Admitting: Physical Therapy

## 2022-04-16 ENCOUNTER — Encounter: Payer: Self-pay | Admitting: Family Medicine

## 2022-04-16 DIAGNOSIS — G8929 Other chronic pain: Secondary | ICD-10-CM

## 2022-04-16 DIAGNOSIS — M1711 Unilateral primary osteoarthritis, right knee: Secondary | ICD-10-CM

## 2022-04-28 ENCOUNTER — Ambulatory Visit (INDEPENDENT_AMBULATORY_CARE_PROVIDER_SITE_OTHER)
Admission: RE | Admit: 2022-04-28 | Discharge: 2022-04-28 | Disposition: A | Discharge status: Home / Self Care | Payer: Medicare HMO | Source: Ambulatory Visit | Attending: Nurse Practitioner | Admitting: Nurse Practitioner

## 2022-04-28 ENCOUNTER — Encounter: Payer: Self-pay | Admitting: Nurse Practitioner

## 2022-04-28 ENCOUNTER — Ambulatory Visit (INDEPENDENT_AMBULATORY_CARE_PROVIDER_SITE_OTHER): Payer: Medicare HMO | Admitting: Nurse Practitioner

## 2022-04-28 VITALS — BP 138/74 | HR 65 | Temp 97.1°F | Resp 18 | Ht <= 58 in | Wt 256.0 lb

## 2022-04-28 DIAGNOSIS — R0689 Other abnormalities of breathing: Secondary | ICD-10-CM

## 2022-04-28 DIAGNOSIS — J069 Acute upper respiratory infection, unspecified: Secondary | ICD-10-CM

## 2022-04-28 DIAGNOSIS — R059 Cough, unspecified: Secondary | ICD-10-CM | POA: Diagnosis not present

## 2022-04-28 DIAGNOSIS — R051 Acute cough: Secondary | ICD-10-CM

## 2022-04-28 DIAGNOSIS — R0602 Shortness of breath: Secondary | ICD-10-CM | POA: Insufficient documentation

## 2022-04-28 MED ORDER — DOXYCYCLINE HYCLATE 100 MG PO TABS: 100.0000 mg | ORAL_TABLET | Freq: Two times a day (BID) | ORAL | 0 refills | Status: AC

## 2022-04-28 MED ORDER — ALBUTEROL SULFATE HFA 108 (90 BASE) MCG/ACT IN AERS
2.0000 | INHALATION_SPRAY | Freq: Four times a day (QID) | RESPIRATORY_TRACT | 0 refills | Status: DC | PRN
Start: 2022-04-28 — End: 2022-08-04

## 2022-04-28 MED ORDER — BENZONATATE 100 MG PO CAPS: 100.0000 mg | ORAL_CAPSULE | Freq: Three times a day (TID) | ORAL | 0 refills | Status: AC | PRN

## 2022-04-28 NOTE — Patient Instructions (Signed)
Nice to see you today I will be in touch with the xray once I have the results Follow up if no improvement 

## 2022-04-28 NOTE — Assessment & Plan Note (Signed)
Will write Tessalon Perles 100 mg 3 times daily as needed cough.  Obtain chest x-ray today, pending result ?

## 2022-04-28 NOTE — Assessment & Plan Note (Signed)
No wheezing on exam.  We will treat with albuterol inhaler 2 puffs every 6 hours as needed shortness of breath. ?

## 2022-04-28 NOTE — Progress Notes (Signed)
? ?Acute Office Visit ? ?Subjective:  ? ?  ?Patient ID: Joan Russo, female    DOB: [birth year], 66 y.o.   MRN: [id number] ? ?Chief Complaint  ?Patient presents with  ? Cough  ?  X 2 weeks, chest congestion, head congestion, ears feel full, had a bad sore throat but better some now, felt feverish at the beginning of the symptoms. Has taking Alkelsetzer cold/flu, Nyquil. Covid test negative about a week ago at home.  ? ? ? ?Patient is in today for  ? ?Symptoms started around 2 weeks ago ?States that she went to a wedding and unsure of a contact ?Covid vaccine x2  ?Flu vaccine utd ?Covid test was negative about a week ago ?Has been using Alklaseltzer and nyquill with some relief  ? ? ?Review of Systems  ?Constitutional:  Positive for malaise/fatigue. Negative for chills and fever.  ?     Decreased appetite .  ?HENT:  Positive for congestion, ear pain (full pressure feeling) and sore throat (thinks because of cough. improved). Negative for sinus pain.   ?Respiratory:  Positive for cough (productive very little), shortness of breath and wheezing.   ?Musculoskeletal:  Negative for joint pain and myalgias.  ?Neurological:  Positive for headaches.  ? ? ?   ?Objective:  ?  ?BP 138/74   Pulse 65   Temp (!) 97.1 ?F (36.2 ?C)   Resp 18   Ht '4\' 10"'$  (1.473 m)   Wt 256 lb (116.1 kg)   SpO2 98%   BMI 53.50 kg/m?  ? ? ?Physical Exam ?Vitals and nursing note reviewed.  ?Constitutional:   ?   Appearance: Normal appearance. She is obese.  ?HENT:  ?   Right Ear: Tympanic membrane, ear canal and external ear normal.  ?   Left Ear: Tympanic membrane, ear canal and external ear normal.  ?   Nose:  ?   Right Sinus: No maxillary sinus tenderness or frontal sinus tenderness.  ?   Left Sinus: No maxillary sinus tenderness or frontal sinus tenderness.  ?   Mouth/Throat:  ?   Mouth: Mucous membranes are moist.  ?   Pharynx: Oropharynx is clear.  ?Cardiovascular:  ?   Rate and Rhythm: Regular rhythm.  ?   Heart sounds: Normal heart  sounds.  ?Pulmonary:  ?   Effort: Pulmonary effort is normal.  ?   Breath sounds: Examination of the left-lower field reveals rales. Rales present.  ?Neurological:  ?   Mental Status: She is alert.  ? ? ?No results found for any visits on 04/28/22. ? ? ?   ?Assessment & Plan:  ? ?Problem List Items Addressed This Visit   ? ?  ? Respiratory  ? Upper respiratory tract infection - Primary  ?  Given length of symptoms patient age and comorbidities would like to treat with doxycycline 100 mg twice daily for 7 days.  Follow-up if no improvement.  Pending chest x-ray ? ?  ?  ? Relevant Medications  ? doxycycline (VIBRA-TABS) 100 MG tablet  ?  ? Other  ? Adventitious breath sounds  ?  Felt like a hard rales in the left lower lobe posteriorly.  Pending chest x-ray ? ?  ?  ? Relevant Orders  ? DG Chest 2 View  ? Acute cough  ?  Will write Tessalon Perles 100 mg 3 times daily as needed cough.  Obtain chest x-ray today, pending result ? ?  ?  ? Relevant Medications  ? benzonatate (  TESSALON PERLES) 100 MG capsule  ? albuterol (VENTOLIN HFA) 108 (90 Base) MCG/ACT inhaler  ? Shortness of breath  ?  No wheezing on exam.  We will treat with albuterol inhaler 2 puffs every 6 hours as needed shortness of breath. ? ?  ?  ? Relevant Medications  ? albuterol (VENTOLIN HFA) 108 (90 Base) MCG/ACT inhaler  ? ? ?Meds ordered this encounter  ?Medications  ? doxycycline (VIBRA-TABS) 100 MG tablet  ?  Sig: Take 1 tablet (100 mg total) by mouth 2 (two) times daily for 7 days.  ?  Dispense:  14 tablet  ?  Refill:  0  ?  Order Specific Question:   Supervising Provider  ?  Answer:   Loura Pardon A [[id number]]  ? benzonatate (TESSALON PERLES) 100 MG capsule  ?  Sig: Take 1 capsule (100 mg total) by mouth 3 (three) times daily as needed for up to 7 days for cough.  ?  Dispense:  21 capsule  ?  Refill:  0  ?  Order Specific Question:   Supervising Provider  ?  Answer:   Loura Pardon A [[id number]]  ? albuterol (VENTOLIN HFA) 108 (90 Base) MCG/ACT inhaler  ?   Sig: Inhale 2 puffs into the lungs every 6 (six) hours as needed for wheezing or shortness of breath.  ?  Dispense:  8 g  ?  Refill:  0  ?  Order Specific Question:   Supervising Provider  ?  Answer:   Loura Pardon A [[id number]]  ? ? ?No follow-ups on file. ? ?Romilda Garret, NP ? ? ?

## 2022-04-28 NOTE — Assessment & Plan Note (Signed)
Given length of symptoms patient age and comorbidities would like to treat with doxycycline 100 mg twice daily for 7 days.  Follow-up if no improvement.  Pending chest x-ray ?

## 2022-04-28 NOTE — Assessment & Plan Note (Signed)
Felt like a hard rales in the left lower lobe posteriorly.  Pending chest x-ray ?

## 2022-04-29 ENCOUNTER — Encounter: Payer: Self-pay | Admitting: Orthopaedic Surgery

## 2022-04-29 ENCOUNTER — Ambulatory Visit (INDEPENDENT_AMBULATORY_CARE_PROVIDER_SITE_OTHER): Payer: Medicare HMO | Admitting: Orthopaedic Surgery

## 2022-04-29 VITALS — Ht <= 58 in | Wt 256.0 lb

## 2022-04-29 DIAGNOSIS — G8929 Other chronic pain: Secondary | ICD-10-CM | POA: Diagnosis not present

## 2022-04-29 DIAGNOSIS — M25561 Pain in right knee: Secondary | ICD-10-CM

## 2022-04-29 DIAGNOSIS — M1711 Unilateral primary osteoarthritis, right knee: Secondary | ICD-10-CM | POA: Diagnosis not present

## 2022-04-29 NOTE — Progress Notes (Signed)
The patient is a 66 year old female that I am seeing for the first time.  I have been able to review her chart in epic and she does have significant arthritis involving her right knee and I did look at previous x-rays from 2021 showing end-stage arthritis of the right knee.  She has a very remote history of a left knee replacement that was done many years ago.  She has been treated by Dr. [name] appropriately in terms of injections in her knee.  It is gotten to where those injections are not helping her.  She is not a diabetic however, her BMI in our office today is 56.38.  She weighs 256 pounds and is 4 foot 8 and half inches tall. ? ?She does have a decent soft tissue envelope around her right knee.  There is varus malalignment and pain throughout the arc of motion of the knee. ? ?The x-rays do show tricompartment arthritis of the right knee especially evolving the medial compartment the patellofemoral joint and these x-rays are for 2021 so we know it probably gotten slightly worse.  She does ambulate using a cane. ? ?She understands that her hands are tied right now in terms of being able to consider scheduling any surgery for knee replacement given her morbid obesity.  She would need to lose a significant amount of weight.  If we can get her on a weight loss journey this would help offload her knee and will help quite a bit.  I can see her back in 3 months for a repeat weight and BMI check if she would like. ?

## 2022-05-01 ENCOUNTER — Other Ambulatory Visit: Payer: Self-pay | Admitting: Nurse Practitioner

## 2022-05-01 ENCOUNTER — Encounter: Payer: Self-pay | Admitting: Nurse Practitioner

## 2022-05-01 DIAGNOSIS — I517 Cardiomegaly: Secondary | ICD-10-CM

## 2022-05-01 NOTE — Progress Notes (Signed)
Orders for cardiomegaly (Echo) ?

## 2022-05-04 ENCOUNTER — Other Ambulatory Visit: Payer: Self-pay | Admitting: Nurse Practitioner

## 2022-05-04 ENCOUNTER — Encounter: Payer: Self-pay | Admitting: Nurse Practitioner

## 2022-05-04 DIAGNOSIS — M797 Fibromyalgia: Secondary | ICD-10-CM

## 2022-05-04 MED ORDER — PREGABALIN 75 MG PO CAPS: 75.0000 mg | ORAL_CAPSULE | Freq: Two times a day (BID) | ORAL | 3 refills | Status: DC

## 2022-05-08 ENCOUNTER — Ambulatory Visit (INDEPENDENT_AMBULATORY_CARE_PROVIDER_SITE_OTHER): Payer: Medicare HMO | Admitting: Family Medicine

## 2022-05-08 ENCOUNTER — Encounter: Payer: Self-pay | Admitting: Family Medicine

## 2022-05-08 ENCOUNTER — Ambulatory Visit: Payer: Medicare HMO | Admitting: Family Medicine

## 2022-05-08 VITALS — BP 114/60 | HR 62 | Temp 97.8°F | Ht <= 58 in | Wt 248.5 lb

## 2022-05-08 DIAGNOSIS — R0602 Shortness of breath: Secondary | ICD-10-CM

## 2022-05-08 DIAGNOSIS — R0982 Postnasal drip: Secondary | ICD-10-CM

## 2022-05-08 DIAGNOSIS — G4700 Insomnia, unspecified: Secondary | ICD-10-CM

## 2022-05-08 DIAGNOSIS — R5383 Other fatigue: Secondary | ICD-10-CM

## 2022-05-08 LAB — COMPREHENSIVE METABOLIC PANEL
ALT: 19 U/L (ref 0–35)
AST: 18 U/L (ref 0–37)
Albumin: 4.5 g/dL (ref 3.5–5.2)
Alkaline Phosphatase: 76 U/L (ref 39–117)
BUN: 14 mg/dL (ref 6–23)
CO2: 28 mEq/L (ref 19–32)
Calcium: 10.6 mg/dL — ABNORMAL HIGH (ref 8.4–10.5)
Chloride: 105 mEq/L (ref 96–112)
Creatinine, Ser: 0.88 mg/dL (ref 0.40–1.20)
GFR: 67.69 mL/min (ref >60.00)
Glucose, Bld: 98 mg/dL (ref 70–99)
Potassium: 4.8 mEq/L (ref 3.5–5.1)
Sodium: 142 mEq/L (ref 135–145)
Total Bilirubin: 0.8 mg/dL (ref 0.2–1.2)
Total Protein: 7.2 g/dL (ref 6.0–8.3)

## 2022-05-08 LAB — TSH: TSH: 2.32 u[IU]/mL (ref 0.35–5.50)

## 2022-05-08 LAB — CBC WITH DIFFERENTIAL/PLATELET
Basophils Absolute: 0.1 10*3/uL (ref 0.0–0.1)
Basophils Relative: 0.5 % (ref 0.0–3.0)
Eosinophils Absolute: 0.1 10*3/uL (ref 0.0–0.7)
Eosinophils Relative: 1.1 % (ref 0.0–5.0)
HCT: 49.8 % — ABNORMAL HIGH (ref 36.0–46.0)
Hemoglobin: 16.4 g/dL — ABNORMAL HIGH (ref 12.0–15.0)
Lymphocytes Relative: 51.2 % — ABNORMAL HIGH (ref 12.0–46.0)
Lymphs Abs: 5.7 10*3/uL — ABNORMAL HIGH (ref 0.7–4.0)
MCHC: 32.8 g/dL (ref 30.0–36.0)
MCV: 88.4 fl (ref 78.0–100.0)
Monocytes Absolute: 0.8 10*3/uL (ref 0.1–1.0)
Monocytes Relative: 7 % (ref 3.0–12.0)
Neutro Abs: 4.5 10*3/uL (ref 1.4–7.7)
Neutrophils Relative %: 40.2 % — ABNORMAL LOW (ref 43.0–77.0)
Platelets: 283 10*3/uL (ref 150.0–400.0)
RBC: 5.64 Mil/uL — ABNORMAL HIGH (ref 3.87–5.11)
RDW: 15 % (ref 11.5–15.5)
WBC: 11.2 10*3/uL — ABNORMAL HIGH (ref 4.0–10.5)

## 2022-05-08 LAB — VITAMIN B12: Vitamin B-12: 497 pg/mL (ref 211–911)

## 2022-05-08 LAB — BRAIN NATRIURETIC PEPTIDE: Pro B Natriuretic peptide (BNP): 21 pg/mL (ref 0.0–100.0)

## 2022-05-08 LAB — VITAMIN D 25 HYDROXY (VIT D DEFICIENCY, FRACTURES): VITD: 40.68 ng/mL (ref 30.00–100.00)

## 2022-05-08 NOTE — Patient Instructions (Addendum)
Please stop at the lab to have labs drawn. ? Can try zyrtec or Xyzal for allergy component. ? Continue flonase 2 sprays per nostril daily. Add nasal saline spray 2-3 spray. ?

## 2022-05-08 NOTE — Progress Notes (Signed)
Patient ID: Joan Russo, female    DOB: 17-Mar-1956, 66 y.o.   MRN: [id number]  This visit was conducted in person.  BP 114/60   Pulse 62   Temp 97.8 F (36.6 C) (Oral)   Ht '4\' 10"'$  (1.473 m)   Wt 248 lb 8 oz (112.7 kg)   SpO2 98%   BMI 51.94 kg/m    CC:  Chief Complaint  Patient presents with   Follow-up    Seen by Romilda Garret on 04/28/22-Still not feeling well/phlegm in throat-states she has been sick since April 17   Fatigue   Shortness of Breath   Insomnia   Shocking Pains throughtout body   Emesis    When taking her medication    Subjective:   HPI: Joan Russo is a 66 y.o. female patient of Romilda Garret , NP  with history of obesity, fibromyalgia, OSA, and hypothyroidism presenting on 05/08/2022 for Follow-up (Seen by Romilda Garret on 04/28/22-Still not feeling well/phlegm in throat-states she has been sick since April 17), Fatigue, Shortness of Breath, Insomnia, Shocking Pains throughtout body, and Emesis (When taking her medication)  Seen by [facility] on 5/2  for upper respiratory tract infection.  Ongoing sine 04/13/2022. Started with  congestion, cough  COVID testing negative  Continued to worsen.  Noted reviewed.  Treated with doxycycline 100 mg BID x 7 days. Given albuterol prn. CXR: IMPRESSION: Cardiomegaly.  Pulmonary vascular redistribution.   No improvement in symptoms.   She was referred for ECHO.   She reports that  she continues to have fatigue and SOB. No edema.  No fever.  Occ cough.. from post nasal drip, clear.  Albuterol does not help SOB.  Sing flonase 2 sprays per nostril daily... still congestion.  Some issue with  shortness of breath/insomnia lying at night, better if sitting up at night.  Wearing CPAP.   Occ dizzy with standing.   No chronic lung disease.   Anxiety poor  control, poor sleep  She has been fatgued lately Nausea with taking her meds, all of then.   Sneezing a lot.  She has been off thyroid med for 1-2  week...  Relevant past medical, surgical, family and social history reviewed and updated as indicated. Interim medical history since our last visit reviewed. Allergies and medications reviewed and updated. Outpatient Medications Prior to Visit  Medication Sig Dispense Refill   albuterol (VENTOLIN HFA) 108 (90 Base) MCG/ACT inhaler Inhale 2 puffs into the lungs every 6 (six) hours as needed for wheezing or shortness of breath. 8 g 0   ALPRAZolam (XANAX) 0.5 MG tablet Take 1 tablet (0.5 mg total) by mouth at bedtime as needed for anxiety. 20 tablet 0   clobetasol cream (TEMOVATE) 0.76 % Apply 1 application topically 2 (two) times daily. X 6 wks, then drop to once daily x 6 wks, then 2x/wk 30 g 5   fluticasone (FLONASE) 50 MCG/ACT nasal spray Place 2 sprays into both nostrils daily. 16 g 6   ibuprofen (ADVIL) 600 MG tablet Take 1 tablet (600 mg total) by mouth every 6 (six) hours as needed. 60 tablet 3   levothyroxine (SYNTHROID) 50 MCG tablet Take 1 tablet (50 mcg total) by mouth daily. 90 tablet 3   Multiple Vitamin (MULTIVITAMIN WITH MINERALS) TABS tablet Take 1 tablet by mouth daily.     NON FORMULARY CBD GUMMIES -to help with knee pain     pregabalin (LYRICA) 75 MG capsule Take 1 capsule (75 mg  total) by mouth 2 (two) times daily. 60 capsule 3   Probiotic Product (PROBIOTIC-10 PO) Take by mouth.     venlafaxine (EFFEXOR) 37.5 MG tablet Take 37.5 mg by mouth 2 (two) times daily.     No facility-administered medications prior to visit.     Per HPI unless specifically indicated in ROS section below Review of Systems  Constitutional:  Positive for fatigue. Negative for fever.  HENT:  Negative for congestion.   Eyes:  Negative for pain.  Respiratory:  Positive for shortness of breath. Negative for cough.   Cardiovascular:  Negative for chest pain, palpitations and leg swelling.  Gastrointestinal:  Negative for abdominal pain.  Genitourinary:  Negative for dysuria and vaginal bleeding.   Musculoskeletal:  Negative for back pain.  Neurological:  Positive for dizziness and light-headedness. Negative for syncope and headaches.  Psychiatric/Behavioral:  Negative for dysphoric mood.    Objective:  BP 114/60   Pulse 62   Temp 97.8 F (36.6 C) (Oral)   Ht '4\' 10"'$  (1.473 m)   Wt 248 lb 8 oz (112.7 kg)   SpO2 98%   BMI 51.94 kg/m   Wt Readings from Last 3 Encounters:  05/08/22 248 lb 8 oz (112.7 kg)  04/29/22 256 lb (116.1 kg)  04/28/22 256 lb (116.1 kg)      Physical Exam Constitutional:      General: She is not in acute distress.    Appearance: Normal appearance. She is well-developed. She is not ill-appearing or toxic-appearing.  HENT:     Head: Normocephalic.     Right Ear: Hearing, tympanic membrane, ear canal and external ear normal. Tympanic membrane is not erythematous, retracted or bulging.     Left Ear: Hearing, tympanic membrane, ear canal and external ear normal. Tympanic membrane is not erythematous, retracted or bulging.     Nose: No mucosal edema or rhinorrhea.     Right Sinus: No maxillary sinus tenderness or frontal sinus tenderness.     Left Sinus: No maxillary sinus tenderness or frontal sinus tenderness.     Mouth/Throat:     Pharynx: Uvula midline.  Eyes:     General: Lids are normal. Lids are everted, no foreign bodies appreciated.     Conjunctiva/sclera: Conjunctivae normal.     Pupils: Pupils are equal, round, and reactive to light.  Neck:     Thyroid: No thyroid mass or thyromegaly.     Vascular: No carotid bruit.     Trachea: Trachea normal.  Cardiovascular:     Rate and Rhythm: Normal rate and regular rhythm.     Pulses: Normal pulses.     Heart sounds: Normal heart sounds, S1 normal and S2 normal. No murmur heard.    No friction rub. No gallop.  Pulmonary:     Effort: Pulmonary effort is normal. No tachypnea or respiratory distress.     Breath sounds: Normal breath sounds. No decreased breath sounds, wheezing, rhonchi or rales.   Abdominal:     General: Bowel sounds are normal.     Palpations: Abdomen is soft.     Tenderness: There is no abdominal tenderness.  Musculoskeletal:     Cervical back: Normal range of motion and neck supple.  Skin:    General: Skin is warm and dry.     Findings: No rash.  Neurological:     Mental Status: She is alert.  Psychiatric:        Mood and Affect: Mood is not anxious or depressed.  Speech: Speech normal.        Behavior: Behavior normal. Behavior is cooperative.        Thought Content: Thought content normal.        Judgment: Judgment normal.       Results for orders placed or performed in visit on 12/03/21  CBC with Differential/Platelet  Result Value Ref Range   WBC 8.4 4.0 - 10.5 K/uL   RBC 4.98 3.87 - 5.11 Mil/uL   Hemoglobin 14.8 12.0 - 15.0 g/dL   HCT 44.5 36.0 - 46.0 %   MCV 89.3 78.0 - 100.0 fl   MCHC 33.4 30.0 - 36.0 g/dL   RDW 14.4 11.5 - 15.5 %   Platelets 243.0 150.0 - 400.0 K/uL   Neutrophils Relative % 40.3 (L) 43.0 - 77.0 %   Lymphocytes Relative 47.4 (H) 12.0 - 46.0 %   Monocytes Relative 7.3 3.0 - 12.0 %   Eosinophils Relative 4.4 0.0 - 5.0 %   Basophils Relative 0.6 0.0 - 3.0 %   Neutro Abs 3.4 1.4 - 7.7 K/uL   Lymphs Abs 4.0 0.7 - 4.0 K/uL   Monocytes Absolute 0.6 0.1 - 1.0 K/uL   Eosinophils Absolute 0.4 0.0 - 0.7 K/uL   Basophils Absolute 0.0 0.0 - 0.1 K/uL  Lipase  Result Value Ref Range   Lipase 18.0 11.0 - 59.0 U/L  Comprehensive metabolic panel  Result Value Ref Range   Sodium 140 135 - 145 mEq/L   Potassium 4.5 3.5 - 5.1 mEq/L   Chloride 105 96 - 112 mEq/L   CO2 29 19 - 32 mEq/L   Glucose, Bld 88 70 - 99 mg/dL   BUN 14 6 - 23 mg/dL   Creatinine, Ser 1.06 0.40 - 1.20 mg/dL   Total Bilirubin 0.5 0.2 - 1.2 mg/dL   Alkaline Phosphatase 81 39 - 117 U/L   AST 20 0 - 37 U/L   ALT 15 0 - 35 U/L   Total Protein 7.3 6.0 - 8.3 g/dL   Albumin 4.3 3.5 - 5.2 g/dL   GFR 54.30 (L) >60.00 mL/min   Calcium 10.0 8.4 - 10.5 mg/dL   Microalbumin/Creatinine Ratio, Urine  Result Value Ref Range   Microalb, Ur 0.8 0.0 - 1.9 mg/dL   Creatinine,U 156.0 mg/dL   Microalb Creat Ratio 0.5 0.0 - 30.0 mg/g  POCT URINALYSIS DIP (CLINITEK)  Result Value Ref Range   Color, UA orange (A) yellow   Clarity, UA hazy (A) clear   Glucose, UA negative negative mg/dL   Bilirubin, UA negative negative   Ketones, POC UA negative negative mg/dL   Spec Grav, UA >=1.030 (A) 1.010 - 1.025   Blood, UA negative negative   pH, UA 5.5 5.0 - 8.0   POC PROTEIN,UA trace negative, trace   Urobilinogen, UA 0.2 0.2 or 1.0 E.U./dL   Nitrite, UA Negative Negative   Leukocytes, UA Negative Negative     COVID 19 screen:  No recent travel or known exposure to COVID19 The patient denies respiratory symptoms of COVID 19 at this time. The importance of social distancing was discussed today.   Assessment and Plan    Problem List Items Addressed This Visit     Other fatigue    The due to insomnia.  Will evaluate with labs for secondary causes.      Relevant Orders   Comprehensive metabolic panel (Completed)   Vitamin B12 (Completed)   CBC with Differential/Platelet (Completed)   VITAMIN D 25 Hydroxy (Vit-D  Deficiency, Fractures) (Completed)   TSH (Completed)   PND (post-nasal drip)     Acute  Can try zyrtec or Xyzal for allergy component.  Continue flonase 2 sprays per nostril daily. Add nasal saline spray 2-3 spray.      SOB (shortness of breath) - Primary    Acute Chest x-ray unremarkable except for cardiomegaly.  No current signs of continued infection.  Echocardiogram pending.  Shortness of breath could be related to allergy symptoms and postnasal drip.  Not clearly due to asthma as albuterol does not help with shortness of breath.  Evaluate with labs      Relevant Orders   Brain natriuretic peptide (Completed)   Other Visit Diagnoses     Insomnia, unspecified type           Orders Placed This Encounter  Procedures    Comprehensive metabolic panel   Vitamin L89   CBC with Differential/Platelet   VITAMIN D 25 Hydroxy (Vit-D Deficiency, Fractures)   TSH   Brain natriuretic peptide       Eliezer Lofts, MD

## 2022-05-11 ENCOUNTER — Encounter: Payer: Self-pay | Admitting: Family Medicine

## 2022-05-15 ENCOUNTER — Ambulatory Visit (HOSPITAL_COMMUNITY): Payer: Medicare HMO | Attending: Cardiology

## 2022-05-15 DIAGNOSIS — I517 Cardiomegaly: Secondary | ICD-10-CM | POA: Diagnosis not present

## 2022-05-15 LAB — ECHOCARDIOGRAM COMPLETE
Area-P 1/2: 2.95 cm2
Calc EF: 59.8 %
S' Lateral: 3.1 cm
Single Plane A2C EF: 56.1 %
Single Plane A4C EF: 64.1 %

## 2022-05-28 ENCOUNTER — Other Ambulatory Visit: Payer: Self-pay | Admitting: Nurse Practitioner

## 2022-05-28 DIAGNOSIS — F32A Depression, unspecified: Secondary | ICD-10-CM

## 2022-05-28 MED ORDER — ALPRAZOLAM 0.5 MG PO TABS: 0.5000 mg | ORAL_TABLET | Freq: Every evening | ORAL | 0 refills | Status: DC | PRN

## 2022-06-05 ENCOUNTER — Ambulatory Visit (INDEPENDENT_AMBULATORY_CARE_PROVIDER_SITE_OTHER): Payer: Medicare HMO

## 2022-06-05 VITALS — Wt 248.0 lb

## 2022-06-05 DIAGNOSIS — Z0000 Encounter for general adult medical examination without abnormal findings: Secondary | ICD-10-CM | POA: Diagnosis not present

## 2022-06-05 NOTE — Progress Notes (Signed)
Virtual Visit via Telephone Note  I connected with  Joan Russo on 06/05/22 at  9:00 AM EDT by telephone and verified that I am speaking with the correct person using two identifiers.  Location: Patient: home Provider: [place] Persons participating in the virtual visit: [place]   I discussed the limitations, risks, security and privacy concerns of performing an evaluation and management service by telephone and the availability of in person appointments. The patient expressed understanding and agreed to proceed.  Interactive audio and video telecommunications were attempted between this nurse and patient, however failed, due to patient having technical difficulties OR patient did not have access to video capability.  We continued and completed visit with audio only.  Some vital signs may be absent or patient reported.   Joan David, LPN  Subjective:   Joan Russo is a 66 y.o. female who presents for Medicare Annual (Subsequent) preventive examination.  Review of Systems           Objective:    There were no vitals filed for this visit. There is no height or weight on file to calculate BMI.     06/04/2021    8:16 AM 05/14/2020    2:05 PM 03/27/2020   11:10 AM 11/29/2019    8:59 AM 06/06/2019    6:04 AM 06/01/2019    9:26 AM 07/07/2016    7:59 AM  Advanced Directives  Does Patient Have a Medical Advance Directive? Yes Yes No Yes No Yes Yes  Type of Paramedic of [place];Living will [place];Living will  Healthcare Power of [place]  Does patient want to make changes to medical advance directive?    No - Patient declined No - Patient declined No - Patient declined   Copy of [place] in Chart? No - copy requested No - copy requested     No - copy requested  Would patient like information on creating a medical advance directive?   No - Patient  declined  No - Patient declined      Current Medications (verified) Outpatient Encounter Medications as of 06/05/2022  Medication Sig   ALPRAZolam (XANAX) 0.5 MG tablet Take 1 tablet (0.5 mg total) by mouth at bedtime as needed for anxiety.   fluticasone (FLONASE) 50 MCG/ACT nasal spray Place 2 sprays into both nostrils daily.   ibuprofen (ADVIL) 600 MG tablet Take 1 tablet (600 mg total) by mouth every 6 (six) hours as needed.   levothyroxine (SYNTHROID) 50 MCG tablet Take 1 tablet (50 mcg total) by mouth daily.   Multiple Vitamin (MULTIVITAMIN WITH MINERALS) TABS tablet Take 1 tablet by mouth daily.   NON FORMULARY CBD GUMMIES -to help with knee pain   pregabalin (LYRICA) 75 MG capsule Take 1 capsule (75 mg total) by mouth 2 (two) times daily.   Probiotic Product (PROBIOTIC-10 PO) Take by mouth.   venlafaxine (EFFEXOR) 37.5 MG tablet Take 37.5 mg by mouth 2 (two) times daily.   albuterol (VENTOLIN HFA) 108 (90 Base) MCG/ACT inhaler Inhale 2 puffs into the lungs every 6 (six) hours as needed for wheezing or shortness of breath. (Patient not taking: Reported on 06/05/2022)   clobetasol cream (TEMOVATE) 7.62 % Apply 1 application topically 2 (two) times daily. X 6 wks, then drop to once daily x 6 wks, then 2x/wk (Patient not taking: Reported on 06/05/2022)   No facility-administered encounter medications on file as  of 06/05/2022.    Allergies (verified) Patient has no known allergies.   History: Past Medical History:  Diagnosis Date   Anxiety    Anxiety and depression    Depression    Diverticulosis    GERD (gastroesophageal reflux disease)    Internal hemorrhoids    OA (osteoarthritis)    Obesity, Class III, BMI 40-49.9 (morbid obesity) ([place])    Osteopenia    Tubular adenoma    Urinary incontinence    Vitamin D deficiency    Past Surgical History:  Procedure Laterality Date   BREAST BIOPSY Right    COLONOSCOPY WITH PROPOFOL N/A 07/07/2016   Procedure: COLONOSCOPY WITH PROPOFOL;   Surgeon: Jerene Bears, MD;  Location: [facility];  Service: Gastroenterology;  Laterality: N/A;   ESOPHAGOGASTRODUODENOSCOPY (EGD) WITH PROPOFOL N/A 07/07/2016   Procedure: ESOPHAGOGASTRODUODENOSCOPY (EGD) WITH PROPOFOL;  Surgeon: Jerene Bears, MD;  Location: [facility];  Service: Gastroenterology;  Laterality: N/A;   HYSTEROSCOPY WITH D & C N/A 01/16/2015   Procedure: DILATATION AND CURETTAGE /HYSTEROSCOPY;  Surgeon: Donnamae Jude, MD;  Location: [facility];  Service: Gynecology;  Laterality: N/A;   HYSTEROSCOPY WITH D & C N/A 06/06/2019   Procedure: DILATATION AND CURETTAGE /HYSTEROSCOPY;  Surgeon: Homero Fellers, MD;  Location: [facility];  Service: Gynecology;  Laterality: N/A;   JOINT REPLACEMENT     TOTAL KNEE ARTHROPLASTY  08/2008   Left   Family History  Problem Relation Age of Onset   Cancer Mother 57       breast   Breast cancer Mother 41   Cancer Daughter 59       breast   Breast cancer Daughter 51   Cancer Father    Colon cancer Neg Hx    Esophageal cancer Neg Hx    Rectal cancer Neg Hx    Stomach cancer Neg Hx    Social History   Socioeconomic History   Marital status: Widowed    Spouse name: Not on file   Number of children: 4   Years of education: Not on file   Highest education level: Not on file  Occupational History   Occupation: Armed forces technical officer  Tobacco Use   Smoking status: Never   Smokeless tobacco: Never  Vaping Use   Vaping Use: Never used  Substance and Sexual Activity   Alcohol use: No    Alcohol/week: 0.0 standard drinks of alcohol   Drug use: No   Sexual activity: Not Currently    Birth control/protection: Post-menopausal  Other Topics Concern   Not on file  Social History Narrative   Not on file   Social Determinants of Health   Financial Resource Strain: Low Risk  (06/04/2021)   Overall Financial Resource Strain (CARDIA)    Difficulty of Paying Living Expenses: Not hard at all  Food Insecurity: No Food Insecurity (06/04/2021)    Hunger Vital Sign    Worried About Running Out of Food in the Last Year: Never true    [place] in the Last Year: Never true  Transportation Needs: No Transportation Needs (06/04/2021)   PRAPARE - Hydrologist (Medical): No    Lack of Transportation (Non-Medical): No  Physical Activity: Inactive (06/04/2021)   Exercise Vital Sign    Days of Exercise per Week: 0 days    Minutes of Exercise per Session: 0 min  Stress: Stress Concern Present (06/05/2022)   [place]  Feeling of Stress : To some extent  Social Connections: Not on file    Tobacco Counseling Counseling given: Not Answered   Clinical Intake:  Pre-visit preparation completed: Yes  Pain : No/denies pain     Nutritional Risks: None Diabetes: No  How often do you need to have someone help you when you read instructions, pamphlets, or other written materials from your doctor or pharmacy?: 1 - Never  Diabetic?no  Interpreter Needed?: No  Information entered by :: Kirke Shaggy, LPN   Activities of Daily Living     No data to display          Patient Care Team: Michela Pitcher, NP as PCP - General  Indicate any recent Medical Services you may have received from other than [facility] providers in the past year (date may be approximate).     Assessment:   This is a routine wellness examination for [place].  Hearing/Vision screen No results found.  Dietary issues and exercise activities discussed:     Goals Addressed             This Visit's Progress    DIET - EAT MORE FRUITS AND VEGETABLES         Depression Screen    06/05/2022    8:56 AM 02/19/2022    2:20 PM 09/17/2021   12:27 PM 06/04/2021    8:17 AM 05/14/2020    2:07 PM 03/12/2020    9:39 AM 11/21/2018    8:14 AM  PHQ 2/9 Scores  PHQ - 2 Score 0 0 6 0 0 5 2  PHQ- 9 Score '2 2 21 '$ 0 0 12 7    Fall Risk    06/04/2021    8:15 AM 05/14/2020     2:05 PM  Fall Risk   Falls in the past year? 1 1  Comment  dizzy spells  Number falls in past yr: 1 1  Injury with Fall? 0 0  Risk for fall due to : Medication side effect;Impaired balance/gait Impaired balance/gait;Medication side effect  Follow up Falls evaluation completed;Falls prevention discussed Falls evaluation completed;Falls prevention discussed    FALL RISK PREVENTION PERTAINING TO THE HOME:  Any stairs in or around the home? Yes  If so, are there any without handrails? No  Home free of loose throw rugs in walkways, pet beds, electrical cords, etc? Yes  Adequate lighting in your home to reduce risk of falls? Yes   ASSISTIVE DEVICES UTILIZED TO PREVENT FALLS:  Life alert? No  Use of a cane, walker or w/c? Yes  Grab bars in the bathroom? No  Shower chair or bench in shower? Yes  Elevated toilet seat or a handicapped toilet? No     Cognitive Function:    06/04/2021    8:22 AM 05/14/2020    2:09 PM  MMSE - Mini Mental State Exam  Orientation to time 5 5  Orientation to Place 5 5  Registration 3 3  Attention/ Calculation 5 5  Recall 3 3  Language- repeat 1 1        Immunizations Immunization History  Administered Date(s) Administered   Fluad Quad(high Dose 65+) 08/29/2019, 09/05/2020, 09/17/2021   Influenza Split 10/13/2011   Influenza,inj,Quad PF,6+ Mos 09/19/2014, 10/14/2015, 08/28/2017, 10/17/2018   PFIZER(Purple Top)SARS-COV-2 Vaccination 09/29/2020, 12/09/2020    TDAP status: Due, Education has been provided regarding the importance of this vaccine. Advised may receive this vaccine at local pharmacy or Health Dept. Aware to provide a copy of the  vaccination record if obtained from local pharmacy or Health Dept. Verbalized acceptance and understanding.  Flu Vaccine status: Up to date  Pneumococcal vaccine status: Declined,  Education has been provided regarding the importance of this vaccine but patient still declined. Advised may receive this vaccine at  local pharmacy or Health Dept. Aware to provide a copy of the vaccination record if obtained from local pharmacy or Health Dept. Verbalized acceptance and understanding.   Covid-19 vaccine status: Completed vaccines  Qualifies for Shingles Vaccine? Yes   Zostavax completed No   Shingrix Completed?: No.    Education has been provided regarding the importance of this vaccine. Patient has been advised to call insurance company to determine out of pocket expense if they have not yet received this vaccine. Advised may also receive vaccine at local pharmacy or Health Dept. Verbalized acceptance and understanding.  Screening Tests Health Maintenance  Topic Date Due   Hepatitis C Screening  Never done   Zoster Vaccines- Shingrix (1 of 2) Never done   Pneumonia Vaccine 98+ Years old (1 - PCV) Never done   DEXA SCAN  04/08/2019   COVID-19 Vaccine (3 - Pfizer series) 02/03/2021   TETANUS/TDAP  05/14/2024 (Originally 10/13/2019)   INFLUENZA VACCINE  07/28/2022   MAMMOGRAM  01/31/2024   COLONOSCOPY (Pts 45-42yr Insurance coverage will need to be confirmed)  07/07/2026   HPV VACCINES  Aged Out    Health Maintenance  Health Maintenance Due  Topic Date Due   Hepatitis C Screening  Never done   Zoster Vaccines- Shingrix (1 of 2) Never done   Pneumonia Vaccine 68 Years old (1 - PCV) Never done   DEXA SCAN  04/08/2019   COVID-19 Vaccine (3 - Pfizer series) 02/03/2021    Colorectal cancer screening: Type of screening: Colonoscopy. Completed 07/07/16. Repeat every 10 years  Mammogram status: Completed 01/30/22. Repeat every year  Bone Density status: Completed 12/19/08. Results reflect: Bone density results: OSTEOPENIA. Repeat every 5 years.-declined referral  Lung Cancer Screening: (Low Dose CT Chest recommended if Age 68-80years, 30 pack-year currently smoking OR have quit w/in 15years.) does not qualify.   Additional Screening:  Hepatitis C Screening: does qualify; Completed no  Vision  Screening: Recommended annual ophthalmology exams for early detection of glaucoma and other disorders of the eye. Is the patient up to date with their annual eye exam?  Yes  Who is the provider or what is the name of the office in which the patient [facility]ends [facility] exams[facility]  If pt is not established with a provider, would they like to be referred to a provider to establish care? No .   Dental Screening: Recommended annual dental exams for proper oral hygiene  Community Resource Referral / Chronic Care Management: CRR required this visit?  No   CCM required this visit?  No      Plan:     I have personally reviewed and noted the following in the patient's chart:   Medical and social history Use of alcohol, tobacco or illicit drugs  Current medications and supplements including opioid prescriptions.  Functional ability and status Nutritional status Physical activity Advanced directives List of other physicians Hospitalizations, surgeries, and ER visits in previous 12 months Vitals Screenings to include cognitive, depression, and falls Referrals and appointments  In addition, I have reviewed and discussed with patient certain preventive protocols, quality metrics, and best practice recommendations. A written personalized care plan for preventive services as well as general preventive health recommendations  were provided to patient.     Joan David, LPN   01/04/2882   Nurse Notes: none

## 2022-06-05 NOTE — Patient Instructions (Signed)
Joan Russo , Thank you for taking time to come for your Medicare Wellness Visit. I appreciate your ongoing commitment to your health goals. Please review the following plan we discussed and let me know if I can assist you in the future.   Screening recommendations/referrals: Colonoscopy: 07/07/16 Mammogram: 01/30/22 Bone Density: declined referral Recommended yearly ophthalmology/optometry visit for glaucoma screening and checkup Recommended yearly dental visit for hygiene and checkup  Vaccinations: Influenza vaccine: 09/17/21 Pneumococcal vaccine: n/d Tdap vaccine: 10/12/09, due Shingles vaccine: n/d   Covid-19:09/29/20, 12/09/20  Advanced directives: yes, copy needed  Conditions/risks identified: none  Next appointment: Follow up in one year for your annual wellness visit 06/08/23 @ 9:30 am by phone   Preventive Care 65 Years and Older, Female Preventive care refers to lifestyle choices and visits with your health care provider that can promote health and wellness. What does preventive care include? A yearly physical exam. This is also called an annual well check. Dental exams once or twice a year. Routine eye exams. Ask your health care provider how often you should have your eyes checked. Personal lifestyle choices, including: Daily care of your teeth and gums. Regular physical activity. Eating a healthy diet. Avoiding tobacco and drug use. Limiting alcohol use. Practicing safe sex. Taking low-dose aspirin every day. Taking vitamin and mineral supplements as recommended by your health care provider. What happens during an annual well check? The services and screenings done by your health care provider during your annual well check will depend on your age, overall health, lifestyle risk factors, and family history of disease. Counseling  Your health care provider may ask you questions about your: Alcohol use. Tobacco use. Drug use. Emotional well-being. Home and  relationship well-being. Sexual activity. Eating habits. History of falls. Memory and ability to understand (cognition). Work and work Statistician. Reproductive health. Screening  You may have the following tests or measurements: Height, weight, and BMI. Blood pressure. Lipid and cholesterol levels. These may be checked every 5 years, or more frequently if you are over 66 years old. Skin check. Lung cancer screening. You may have this screening every year starting at age 12 if you have a 30-pack-year history of smoking and currently smoke or have quit within the past 15 years. Fecal occult blood test (FOBT) of the stool. You may have this test every year starting at age 73. Flexible sigmoidoscopy or colonoscopy. You may have a sigmoidoscopy every 5 years or a colonoscopy every 10 years starting at age 11. Hepatitis C blood test. Hepatitis B blood test. Sexually transmitted disease (STD) testing. Diabetes screening. This is done by checking your blood sugar (glucose) after you have not eaten for a while (fasting). You may have this done every 1-3 years. Bone density scan. This is done to screen for osteoporosis. You may have this done starting at age 84. Mammogram. This may be done every 1-2 years. Talk to your health care provider about how often you should have regular mammograms. Talk with your health care provider about your test results, treatment options, and if necessary, the need for more tests. Vaccines  Your health care provider may recommend certain vaccines, such as: Influenza vaccine. This is recommended every year. Tetanus, diphtheria, and acellular pertussis (Tdap, Td) vaccine. You may need a Td booster every 10 years. Zoster vaccine. You may need this after age 58. Pneumococcal 13-valent conjugate (PCV13) vaccine. One dose is recommended after age 19. Pneumococcal polysaccharide (PPSV23) vaccine. One dose is recommended after age 71. Talk to  your health care provider  about which screenings and vaccines you need and how often you need them. This information is not intended to replace advice given to you by your health care provider. Make sure you discuss any questions you have with your health care provider. Document Released: 01/10/2016 Document Revised: 09/02/2016 Document Reviewed: 10/15/2015 Elsevier Interactive Patient Education  2017 [place] Prevention in the Home Falls can cause injuries. They can happen to people of all ages. There are many things you can do to make your home safe and to help prevent falls. What can I do on the outside of my home? Regularly fix the edges of walkways and driveways and fix any cracks. Remove anything that might make you trip as you walk through a door, such as a raised step or threshold. Trim any bushes or trees on the path to your home. Use bright outdoor lighting. Clear any walking paths of anything that might make someone trip, such as rocks or tools. Regularly check to see if handrails are loose or broken. Make sure that both sides of any steps have handrails. Any raised decks and porches should have guardrails on the edges. Have any leaves, snow, or ice cleared regularly. Use sand or salt on walking paths during winter. Clean up any spills in your garage right away. This includes oil or grease spills. What can I do in the bathroom? Use night lights. Install grab bars by the toilet and in the tub and shower. Do not use towel bars as grab bars. Use non-skid mats or decals in the tub or shower. If you need to sit down in the shower, use a plastic, non-slip stool. Keep the floor dry. Clean up any water that spills on the floor as soon as it happens. Remove soap buildup in the tub or shower regularly. Attach bath mats securely with double-sided non-slip rug tape. Do not have throw rugs and other things on the floor that can make you trip. What can I do in the bedroom? Use night lights. Make sure  that you have a light by your bed that is easy to reach. Do not use any sheets or blankets that are too big for your bed. They should not hang down onto the floor. Have a firm chair that has side arms. You can use this for support while you get dressed. Do not have throw rugs and other things on the floor that can make you trip. What can I do in the kitchen? Clean up any spills right away. Avoid walking on wet floors. Keep items that you use a lot in easy-to-reach places. If you need to reach something above you, use a strong step stool that has a grab bar. Keep electrical cords out of the way. Do not use floor polish or wax that makes floors slippery. If you must use wax, use non-skid floor wax. Do not have throw rugs and other things on the floor that can make you trip. What can I do with my stairs? Do not leave any items on the stairs. Make sure that there are handrails on both sides of the stairs and use them. Fix handrails that are broken or loose. Make sure that handrails are as long as the stairways. Check any carpeting to make sure that it is firmly attached to the stairs. Fix any carpet that is loose or worn. Avoid having throw rugs at the top or bottom of the stairs. If you do have throw rugs, attach  them to the floor with carpet tape. Make sure that you have a light switch at the top of the stairs and the bottom of the stairs. If you do not have them, ask someone to add them for you. What else can I do to help prevent falls? Wear shoes that: Do not have high heels. Have rubber bottoms. Are comfortable and fit you well. Are closed at the toe. Do not wear sandals. If you use a stepladder: Make sure that it is fully opened. Do not climb a closed stepladder. Make sure that both sides of the stepladder are locked into place. Ask someone to hold it for you, if possible. Clearly mark and make sure that you can see: Any grab bars or handrails. First and last steps. Where the edge of  each step is. Use tools that help you move around (mobility aids) if they are needed. These include: Canes. Walkers. Scooters. Crutches. Turn on the lights when you go into a dark area. Replace any light bulbs as soon as they burn out. Set up your furniture so you have a clear path. Avoid moving your furniture around. If any of your floors are uneven, fix them. If there are any pets around you, be aware of where they are. Review your medicines with your doctor. Some medicines can make you feel dizzy. This can increase your chance of falling. Ask your doctor what other things that you can do to help prevent falls. This information is not intended to replace advice given to you by your health care provider. Make sure you discuss any questions you have with your health care provider. Document Released: 10/10/2009 Document Revised: 05/21/2016 Document Reviewed: 01/18/2015 Elsevier Interactive Patient Education  2017 [employer].

## 2022-06-24 DIAGNOSIS — R0982 Postnasal drip: Secondary | ICD-10-CM | POA: Insufficient documentation

## 2022-06-24 NOTE — Assessment & Plan Note (Signed)
Acute Chest x-ray unremarkable except for cardiomegaly.  No current signs of continued infection.  Echocardiogram pending.  Shortness of breath could be related to allergy symptoms and postnasal drip.  Not clearly due to asthma as albuterol does not help with shortness of breath.  Evaluate with labs

## 2022-06-24 NOTE — Assessment & Plan Note (Signed)
The due to insomnia.  Will evaluate with labs for secondary causes.

## 2022-06-24 NOTE — Assessment & Plan Note (Signed)
Acute  Can try zyrtec or Xyzal for allergy component.  Continue flonase 2 sprays per nostril daily. Add nasal saline spray 2-3 spray.

## 2022-07-17 ENCOUNTER — Telehealth: Payer: Self-pay | Admitting: Family Medicine

## 2022-07-17 ENCOUNTER — Emergency Department
Admission: EM | Admit: 2022-07-17 | Discharge: 2022-07-17 | Discharge status: Against Medical Advice | Payer: Medicare HMO | Attending: Emergency Medicine | Admitting: Emergency Medicine

## 2022-07-17 ENCOUNTER — Encounter: Payer: Self-pay | Admitting: Family Medicine

## 2022-07-17 ENCOUNTER — Other Ambulatory Visit: Payer: Self-pay

## 2022-07-17 ENCOUNTER — Telehealth (INDEPENDENT_AMBULATORY_CARE_PROVIDER_SITE_OTHER): Payer: Medicare HMO | Admitting: Family Medicine

## 2022-07-17 VITALS — Temp 99.1°F | Ht <= 58 in

## 2022-07-17 DIAGNOSIS — R1013 Epigastric pain: Secondary | ICD-10-CM | POA: Diagnosis not present

## 2022-07-17 DIAGNOSIS — Z5321 Procedure and treatment not carried out due to patient leaving prior to being seen by health care provider: Secondary | ICD-10-CM | POA: Insufficient documentation

## 2022-07-17 DIAGNOSIS — R112 Nausea with vomiting, unspecified: Secondary | ICD-10-CM | POA: Insufficient documentation

## 2022-07-17 DIAGNOSIS — R197 Diarrhea, unspecified: Secondary | ICD-10-CM | POA: Diagnosis not present

## 2022-07-17 DIAGNOSIS — A084 Viral intestinal infection, unspecified: Secondary | ICD-10-CM | POA: Diagnosis not present

## 2022-07-17 DIAGNOSIS — R509 Fever, unspecified: Secondary | ICD-10-CM | POA: Diagnosis not present

## 2022-07-17 LAB — CBC WITH DIFFERENTIAL/PLATELET
Abs Immature Granulocytes: 0.05 10*3/uL (ref 0.00–0.07)
Basophils Absolute: 0 10*3/uL (ref 0.0–0.1)
Basophils Relative: 0 %
Eosinophils Absolute: 0 10*3/uL (ref 0.0–0.5)
Eosinophils Relative: 0 %
HCT: 53.9 % — ABNORMAL HIGH (ref 36.0–46.0)
Hemoglobin: 17.3 g/dL — ABNORMAL HIGH (ref 12.0–15.0)
Immature Granulocytes: 0 %
Lymphocytes Relative: 14 %
Lymphs Abs: 2.1 10*3/uL (ref 0.7–4.0)
MCH: 29.4 pg (ref 26.0–34.0)
MCHC: 32.1 g/dL (ref 30.0–36.0)
MCV: 91.7 fL (ref 80.0–100.0)
Monocytes Absolute: 1 10*3/uL (ref 0.1–1.0)
Monocytes Relative: 6 %
Neutro Abs: 11.9 10*3/uL — ABNORMAL HIGH (ref 1.7–7.7)
Neutrophils Relative %: 80 %
Platelets: 180 10*3/uL (ref 150–400)
RBC: 5.88 MIL/uL — ABNORMAL HIGH (ref 3.87–5.11)
RDW: 15.3 % (ref 11.5–15.5)
WBC: 15.1 10*3/uL — ABNORMAL HIGH (ref 4.0–10.5)
nRBC: 0 % (ref 0.0–0.2)

## 2022-07-17 LAB — COMPREHENSIVE METABOLIC PANEL
ALT: 27 U/L (ref 0–44)
AST: 39 U/L (ref 15–41)
Albumin: 4.3 g/dL (ref 3.5–5.0)
Alkaline Phosphatase: 85 U/L (ref 38–126)
Anion gap: 12 (ref 5–15)
BUN: 16 mg/dL (ref 8–23)
CO2: 20 mmol/L — ABNORMAL LOW (ref 22–32)
Calcium: 9.3 mg/dL (ref 8.9–10.3)
Chloride: 103 mmol/L (ref 98–111)
Creatinine, Ser: 1.05 mg/dL — ABNORMAL HIGH (ref 0.44–1.00)
GFR, Estimated: 58 mL/min — ABNORMAL LOW (ref >60)
Glucose, Bld: 119 mg/dL — ABNORMAL HIGH (ref 70–99)
Potassium: 3.2 mmol/L — ABNORMAL LOW (ref 3.5–5.1)
Sodium: 135 mmol/L (ref 135–145)
Total Bilirubin: 0.8 mg/dL (ref 0.3–1.2)
Total Protein: 7.8 g/dL (ref 6.5–8.1)

## 2022-07-17 LAB — LIPASE, BLOOD: Lipase: 24 U/L (ref 11–51)

## 2022-07-17 LAB — TROPONIN I (HIGH SENSITIVITY): Troponin I (High Sensitivity): 6 ng/L (ref <18)

## 2022-07-17 MED ORDER — ONDANSETRON 4 MG PO TBDP
4.0000 mg | ORAL_TABLET | Freq: Once | ORAL | Status: AC
  Administered 2022-07-17: 4 mg via ORAL
  Filled 2022-07-17: qty 1

## 2022-07-17 NOTE — ED Provider Triage Note (Signed)
Emergency Medicine Provider Triage Evaluation Note  Joan Russo , a 66 y.o. female  was evaluated in triage.  Patient has a history of anxiety, depression, GERD and diverticulosis.  Pt complains of presents to the emergency department with epigastric abdominal pain that started on Wednesday with associated vomiting, nausea and diarrhea.  Patient denies chest tightness or chest pain.  No dysuria, hematuria or increased urinary frequency.  Review of Systems  Positive: Patient has epigastric abdominal pain and nausea. Negative: No chest pain or chest tightness.  Physical Exam  There were no vitals taken for this visit. Gen:   Awake, no distress   Resp:  Normal effort  MSK:   Moves extremities without difficulty  Other:    Medical Decision Making  Medically screening exam initiated at 5:27 PM.  Appropriate orders placed.  Joan Russo was informed that the remainder of the evaluation will be completed by another provider, this initial triage assessment does not replace that evaluation, and the importance of remaining in the ED until their evaluation is complete.     Vallarie Mare Naranja, [place] 07/17/22 1728

## 2022-07-17 NOTE — ED Triage Notes (Signed)
Pt presents to ED with c/o of N,V,D and back pain. Pt states s/s started this past Wednesday, pt states "I just feel like I'm so sick". Pt denies dysuria. Pt states low grade fever.

## 2022-07-17 NOTE — Telephone Encounter (Signed)
7.21.23 no show letter sent

## 2022-07-17 NOTE — Progress Notes (Signed)
Established Patient Office Visit  Subjective   Patient ID: Joan Russo, female    DOB: 02-23-1956  Age: 66 y.o. MRN: [id number]  Chief Complaint  Patient presents with   Diarrhea    Vomiting, diarrhea symptoms started yesterday. Also complains of pain unsure os location patient seems to be confused.    HPI for evaluation of a 1 day history of nausea with vomiting and watery diarrhea.  There is no fever or chills.  She denies blood or pus in her stool.  There is pain in her abdomen that moves around.    Review of Systems  Constitutional: Negative.   HENT: Negative.    Eyes:  Negative for blurred vision, discharge and redness.  Respiratory: Negative.    Cardiovascular: Negative.   Gastrointestinal:  Positive for diarrhea, nausea and vomiting. Negative for abdominal pain, blood in stool, constipation and melena.  Genitourinary: Negative.   Musculoskeletal: Negative.  Negative for joint pain and myalgias.  Skin:  Negative for rash.  Neurological:  Negative for tingling, loss of consciousness and weakness.  Endo/Heme/Allergies:  Negative for polydipsia.      Objective:     Temp 99.1 F (37.3 C) (Oral)   Ht '4\' 10"'$  (1.473 m)   BMI 51.83 kg/m    Physical Exam Constitutional:      General: She is not in acute distress.    Appearance: Normal appearance. She is not ill-appearing, toxic-appearing or diaphoretic.  HENT:     Head: Normocephalic and atraumatic.     Right Ear: External ear normal.     Left Ear: External ear normal.  Eyes:     General: No scleral icterus.       Right eye: No discharge.        Left eye: No discharge.     Extraocular Movements: Extraocular movements intact.     Conjunctiva/sclera: Conjunctivae normal.  Pulmonary:     Effort: Pulmonary effort is normal. No respiratory distress.  Skin:    General: Skin is warm and dry.  Neurological:     Mental Status: She is alert and oriented to person, place, and time.  Psychiatric:        Mood and  Affect: Mood normal.        Behavior: Behavior normal.      No results found for any visits on 07/17/22.    The 10-year ASCVD risk score (Arnett DK, et al., 2019) is: 6.5%    Assessment & Plan:   Problem List Items Addressed This Visit       Digestive   Viral gastroenteritis - Primary    Return in about 1 week (around 07/24/2022), or if symptoms worsen or fail to improve.  Increase noncaffeinated fluids in the diet.  Choose bland foods as appetite returns return to the clinic if not doing better in the next 3 to 5 days.  Libby Maw, MD  Virtual Visit via Video Note  I connected with Joan Russo on 09/08/22 at 11:20 AM EDT by a video enabled telemedicine application and verified that I am speaking with the correct person using two identifiers.  Location: Patient: home alone.  Provider: work   I discussed the limitations of evaluation and management by telemedicine and the availability of in person appointments. The patient expressed understanding and agreed to proceed.  History of Present Illness:    Observations/Objective:   Assessment and Plan:   Follow Up Instructions:    I discussed the assessment and treatment  plan with the patient. The patient was provided an opportunity to ask questions and all were answered. The patient agreed with the plan and demonstrated an understanding of the instructions.   The patient was advised to call back or seek an in-person evaluation if the symptoms worsen or if the condition fails to improve as anticipated.  I provided 15 minutes of non-face-to-face time during this encounter.   Libby Maw, MD

## 2022-07-31 NOTE — Telephone Encounter (Signed)
Pt of [facility]

## 2022-08-04 ENCOUNTER — Ambulatory Visit: Payer: Medicare HMO | Admitting: Family Medicine

## 2022-08-04 ENCOUNTER — Ambulatory Visit: Payer: Self-pay

## 2022-08-04 VITALS — BP 118/76 | HR 73 | Ht <= 58 in | Wt 261.0 lb

## 2022-08-04 DIAGNOSIS — G8929 Other chronic pain: Secondary | ICD-10-CM | POA: Diagnosis not present

## 2022-08-04 DIAGNOSIS — M25552 Pain in left hip: Secondary | ICD-10-CM

## 2022-08-04 DIAGNOSIS — Z6841 Body Mass Index (BMI) 40.0 and over, adult: Secondary | ICD-10-CM | POA: Diagnosis not present

## 2022-08-04 DIAGNOSIS — M1711 Unilateral primary osteoarthritis, right knee: Secondary | ICD-10-CM

## 2022-08-04 DIAGNOSIS — M25561 Pain in right knee: Secondary | ICD-10-CM

## 2022-08-04 NOTE — Progress Notes (Signed)
Joan Russo is a 66 y.o. female who presents to [place] at [facility] today for cont'd R knee and L hip pain. Pt was last seen by Dr. Georgina Snell on 03/31/22 and was given a R knee and L GT steroid injections. Pt sent a MyChart message on 4/20 reporting R knee pain quickly returned and requested a referral to Dr. Ninfa Linden for surgical consultation which occurred on 5/3. Pt was denied surgery due to BMI at this time. Today, pt reports pain has gradually returned and really worsening over this past week.   Dx imaging: 11/26/20 L hip XR  07/30/20 R knee XR  Pertinent review of systems: No fevers or chills  Relevant historical information: Hypertension.  Morbid obesity.  Sleep apnea.   Exam:  BP 118/76   Pulse 73   Ht '4\' 10"'$  (1.473 m)   Wt 261 lb (118.4 kg)   SpO2 96%   BMI 54.55 kg/m  General: Well Developed, well nourished, and in no acute distress.   MSK: Right knee: Mild effusion normal motion with crepitation tender palpation medial joint line.  Left hip: Tender palpation greater trochanter.  Normal hip motion reduced hip abduction strength.    Lab and Radiology Results  Procedure: Real-time Ultrasound Guided Injection of right knee superior lateral patellar space Device: Philips Affiniti 50G Images permanently stored and available for review in PACS Verbal informed consent obtained.  Discussed risks and benefits of procedure. Warned about infection, bleeding, hyperglycemia damage to structures among others. Patient expresses understanding and agreement Time-out conducted.   Noted no overlying erythema, induration, or other signs of local infection.   Skin prepped in a sterile fashion.   Local anesthesia: Topical Ethyl chloride.   With sterile technique and under real time ultrasound guidance: 40 mg of Kenalog and 2 mL of Marcaine injected into knee joint. Fluid seen entering the joint capsule.   Completed without difficulty   Pain immediately  resolved suggesting accurate placement of the medication.   Advised to call if fevers/chills, erythema, induration, drainage, or persistent bleeding.   Images permanently stored and available for review in the ultrasound unit.  Impression: Technically successful ultrasound guided injection.  Procedure: Real-time Ultrasound Guided Injection of left hip greater trochanter bursa Device: Philips Affiniti 50G Images permanently stored and available for review in PACS Verbal informed consent obtained.  Discussed risks and benefits of procedure. Warned about infection, bleeding, hyperglycemia damage to structures among others. Patient expresses understanding and agreement Time-out conducted.   Noted no overlying erythema, induration, or other signs of local infection.   Skin prepped in a sterile fashion.   Local anesthesia: Topical Ethyl chloride.   With sterile technique and under real time ultrasound guidance: 40 mg of Kenalog and 2 mL of Marcaine injected into trochanter bursa. Fluid seen entering the bursa.   Completed without difficulty   Pain immediately resolved suggesting accurate placement of the medication.   Advised to call if fevers/chills, erythema, induration, drainage, or persistent bleeding.   Images permanently stored and available for review in the ultrasound unit.  Impression: Technically successful ultrasound guided injection.       Assessment and Plan: 66 y.o. female with right knee and left lateral hip pain.  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  This is an acute exacerbation of chronic pain syndrome.  Plan for steroid injection today.  Can repeat this injection every 3 months.  Left lateral hip pain due to trochanteric bursitis.  Again this is  an acute exacerbation of a chronic problem.  Also I can repeat this injection every 3 months.  However her fundamental underlying problem is her morbid obesity.  BMI is currently 54.5.  Her BMI needs to be under 40 for total  knee replacement.  I am worried that she is not can to be able to do this on her own.  Additionally I am worried that even [facility] will not provide sufficient weight loss.  Recommend bariatric surgery consultation.  She is already had a referral placed but after today's discussion is interested in attending.  She will call and schedule appointment with bariatric surgery to discuss her options.   PDMP not reviewed this encounter. Orders Placed This Encounter  Procedures   [place] LIMITED JOINT SPACE STRUCTURES LOW LEFT(NO LINKED CHARGES)    Order Specific Question:   Reason for Exam (SYMPTOM  OR DIAGNOSIS REQUIRED)    Answer:   left hip pain    Order Specific Question:   Preferred imaging location?    Answer:   [place]   No orders of the defined types were placed in this encounter.    Discussed warning signs or symptoms. Please see discharge instructions. Patient expresses understanding.   The above documentation has been reviewed and is accurate and complete [name], M.D.

## 2022-08-04 NOTE — Patient Instructions (Addendum)
Thank you for coming in today.   You received an injection today. Seek immediate medical attention if the joint becomes red, extremely painful, or is oozing fluid.   Ask you doctor about Wegovy  Check back in 3 months  [facility] - [phone]

## 2022-09-08 ENCOUNTER — Encounter: Payer: Self-pay | Admitting: Family Medicine

## 2022-09-08 DIAGNOSIS — A084 Viral intestinal infection, unspecified: Secondary | ICD-10-CM | POA: Insufficient documentation

## 2022-09-11 ENCOUNTER — Other Ambulatory Visit: Payer: Self-pay | Admitting: Nurse Practitioner

## 2022-09-11 DIAGNOSIS — F419 Anxiety disorder, unspecified: Secondary | ICD-10-CM

## 2022-09-22 ENCOUNTER — Other Ambulatory Visit: Payer: Self-pay | Admitting: Nurse Practitioner

## 2022-09-22 DIAGNOSIS — E039 Hypothyroidism, unspecified: Secondary | ICD-10-CM

## 2022-09-23 NOTE — Telephone Encounter (Signed)
Please schedule patient for CPE with [facility], due any time now. Thank you

## 2022-09-23 NOTE — Telephone Encounter (Signed)
Scheduled cpe for 09/30/22

## 2022-09-30 ENCOUNTER — Encounter: Payer: Self-pay | Admitting: Nurse Practitioner

## 2022-09-30 ENCOUNTER — Ambulatory Visit (INDEPENDENT_AMBULATORY_CARE_PROVIDER_SITE_OTHER): Payer: Medicare HMO | Admitting: Nurse Practitioner

## 2022-09-30 VITALS — BP 130/74 | HR 68 | Temp 96.6°F | Resp 14 | Ht <= 58 in | Wt 253.0 lb

## 2022-09-30 DIAGNOSIS — M792 Neuralgia and neuritis, unspecified: Secondary | ICD-10-CM | POA: Diagnosis not present

## 2022-09-30 DIAGNOSIS — Z0000 Encounter for general adult medical examination without abnormal findings: Secondary | ICD-10-CM | POA: Diagnosis not present

## 2022-09-30 DIAGNOSIS — Z23 Encounter for immunization: Secondary | ICD-10-CM | POA: Diagnosis not present

## 2022-09-30 DIAGNOSIS — Z13820 Encounter for screening for osteoporosis: Secondary | ICD-10-CM | POA: Diagnosis not present

## 2022-09-30 DIAGNOSIS — F32A Depression, unspecified: Secondary | ICD-10-CM

## 2022-09-30 DIAGNOSIS — F419 Anxiety disorder, unspecified: Secondary | ICD-10-CM

## 2022-09-30 DIAGNOSIS — R7303 Prediabetes: Secondary | ICD-10-CM

## 2022-09-30 DIAGNOSIS — E559 Vitamin D deficiency, unspecified: Secondary | ICD-10-CM | POA: Diagnosis not present

## 2022-09-30 DIAGNOSIS — E039 Hypothyroidism, unspecified: Secondary | ICD-10-CM

## 2022-09-30 DIAGNOSIS — Z1211 Encounter for screening for malignant neoplasm of colon: Secondary | ICD-10-CM | POA: Diagnosis not present

## 2022-09-30 DIAGNOSIS — G4733 Obstructive sleep apnea (adult) (pediatric): Secondary | ICD-10-CM

## 2022-09-30 DIAGNOSIS — M85859 Other specified disorders of bone density and structure, unspecified thigh: Secondary | ICD-10-CM

## 2022-09-30 DIAGNOSIS — Z6841 Body Mass Index (BMI) 40.0 and over, adult: Secondary | ICD-10-CM

## 2022-09-30 DIAGNOSIS — M6289 Other specified disorders of muscle: Secondary | ICD-10-CM

## 2022-09-30 MED ORDER — CYCLOBENZAPRINE HCL 5 MG PO TABS: 5.0000 mg | ORAL_TABLET | Freq: Every evening | ORAL | 0 refills | Status: DC | PRN

## 2022-09-30 MED ORDER — BUPROPION HCL ER (XL) 150 MG PO TB24: 150.0000 mg | ORAL_TABLET | Freq: Every day | ORAL | 0 refills | Status: DC

## 2022-09-30 NOTE — Assessment & Plan Note (Signed)
DEXA scan ordered, pending result.

## 2022-09-30 NOTE — Assessment & Plan Note (Signed)
Patient currently maintained on Lyrica.  Continue taking medication as prescribed

## 2022-09-30 NOTE — Assessment & Plan Note (Signed)
Currently maintained on levothyroxine 50 mcg.  We will check TSH continue medication as prescribed

## 2022-09-30 NOTE — Assessment & Plan Note (Signed)
Patient and weaned herself off of her behavioral health medications.  She states she is crying a lot.  Denies HI/SI/AVH.  We will start patient back on Wellbutrin 150 mg daily.

## 2022-09-30 NOTE — Assessment & Plan Note (Signed)
Patient currently sleeping with CPAP.  Continue using CPAP as directed

## 2022-09-30 NOTE — Assessment & Plan Note (Signed)
Discussed age-appropriate immunizations and screening exams.

## 2022-09-30 NOTE — Patient Instructions (Addendum)
Nice to see you today Make a fasting lab appt within the next 2 weeks. Follow up with me in 6 months, sooner if you need me

## 2022-09-30 NOTE — Assessment & Plan Note (Signed)
Pending A1c in office today. 

## 2022-09-30 NOTE — Progress Notes (Signed)
Established Patient Office Visit  Subjective   Patient ID: Joan Russo, female    DOB: 08/01/1956  Age: 66 y.o. MRN: [id number]  Chief Complaint  Patient presents with   Annual Exam    AWV was on 06/05/22-last pap smear 08/26/2018    HPI  Hypothyroidism: Tolerating medication well.  Patient states that she is taking it by itself in the morning on empty stomach.  Anxiety and depression: States that she has weaned off her medicatoins but as of late and she is crying a lot. She use to be on welbutrin and venalafaxine.  As of note her brother recently passed away within the past week.  for complete physical and follow up of chronic conditions.  Immunizations: -Tetanus:information given to get at pharmacy -Influenza: update today  -Shingles: information given patient to obtain at pharmacy -Pneumonia: Prevnar 20 administered today -Covid: prizer x2  -HPV: aged out  Diet: [place]. One meal a day.  Exercise: No regular exercise. Little exercise. Looking into to yoga in chair  Eye exam: Completes annually. Has appt next week Dental exam: Completes semi-annually   Pap Smear: Completed in 2019 that was negative.  Patient states she is never had an abnormal Pap smear last 1 was normal with no HPV patient is aged out Mammogram: Completed in 01/30/2022  Colonoscopy: Completed in 2017 and repeat in 5 years. Dr. Zenovia Jarred.  Patient does not want undergo a colonoscopy but did agree to Cologuard   Lung Cancer Screening: N/A Dexa: Needs updating, ordered today   Sleep: States that it dependson when the sun sets. States that she will go to bed earlier. 4-010 get up time. Rested for the most part. Does snore.   OSA: wears CPAP at night tolerates well.  Good rest.       Review of Systems  Constitutional:  Negative for chills and fever.  Respiratory:  Negative for shortness of breath.   Cardiovascular:  Negative for chest pain.  Gastrointestinal:  Negative for abdominal pain,  constipation, diarrhea, nausea and vomiting.  Genitourinary:  Negative for dysuria and hematuria.  Neurological:  Negative for headaches.  Psychiatric/Behavioral:  Negative for hallucinations and suicidal ideas.       Objective:     BP 130/74   Pulse 68   Temp (!) 96.6 F (35.9 C)   Resp 14   Ht 4' 8.75" (1.441 m)   Wt 253 lb (114.8 kg)   SpO2 100%   BMI 55.23 kg/m  BP Readings from Last 3 Encounters:  09/30/22 130/74  08/04/22 118/76  05/08/22 114/60   Wt Readings from Last 3 Encounters:  09/30/22 253 lb (114.8 kg)  08/04/22 261 lb (118.4 kg)  06/05/22 248 lb (112.5 kg)      Physical Exam Vitals and nursing note reviewed.  Constitutional:      Appearance: Normal appearance. She is obese.  HENT:     Right Ear: Tympanic membrane, ear canal and external ear normal.     Left Ear: Ear canal and external ear normal.     Mouth/Throat:     Mouth: Mucous membranes are moist.     Pharynx: Oropharynx is clear.  Eyes:     Extraocular Movements: Extraocular movements intact.     Pupils: Pupils are equal, round, and reactive to light.  Cardiovascular:     Rate and Rhythm: Normal rate and regular rhythm.     Pulses: Normal pulses.     Heart sounds: Normal heart sounds.  Pulmonary:  Effort: Pulmonary effort is normal.     Breath sounds: Normal breath sounds.  Abdominal:     General: Bowel sounds are normal. There is no distension.     Palpations: There is no mass.     Tenderness: There is no abdominal tenderness.     Hernia: No hernia is present.  Musculoskeletal:        General: Tenderness present.       Arms:     Right lower leg: No edema.     Left lower leg: No edema.  Lymphadenopathy:     Cervical: No cervical adenopathy.  Skin:    General: Skin is warm.  Neurological:     General: No focal deficit present.     Mental Status: She is alert.     Comments: Bilateral upper and lower extremity strength 5/5  Psychiatric:        Mood and Affect: Mood normal.         Behavior: Behavior normal.        Thought Content: Thought content normal.        Judgment: Judgment normal.      No results found for any visits on 09/30/22.    The 10-year ASCVD risk score (Arnett DK, et al., 2019) is: 7.8%    Assessment & Plan:   Problem List Items Addressed This Visit       Respiratory   OSA (obstructive sleep apnea)    Patient currently sleeping with CPAP.  Continue using CPAP as directed        Endocrine   Hypothyroidism    Currently maintained on levothyroxine 50 mcg.  We will check TSH continue medication as prescribed      Relevant Orders   TSH     Musculoskeletal and Integument   Osteopenia    DEXA scan ordered, pending result.        Other   Depression    Patient and weaned herself off of her behavioral health medications.  She states she is crying a lot.  Denies HI/SI/AVH.  We will start patient back on Wellbutrin 150 mg daily.      Relevant Medications   buPROPion (WELLBUTRIN XL) 150 MG 24 hr tablet   Vitamin D deficiency    Pending lab in office      Relevant Orders   VITAMIN D 25 Hydroxy (Vit-D Deficiency, Fractures)   Preventative health care - Primary    Discussed age-appropriate immunizations and screening exams.      Relevant Orders   CBC   Comprehensive metabolic panel   Hemoglobin A1c   Lipid panel   Neuropathic pain    Patient currently maintained on Lyrica.  Continue taking medication as prescribed      Prediabetes    Pending A1c in office today.      Relevant Orders   Hemoglobin A1c   Lipid panel   Class 3 severe obesity due to excess calories without serious comorbidity with body mass index (BMI) of 50.0 to 59.9 in adult [facility])   Relevant Orders   Hemoglobin A1c   Lipid panel   Muscle tightness    We will trial cyclobenzaprine 5 mg nightly as needed.  Sedation precautions reviewed      Relevant Medications   cyclobenzaprine (FLEXERIL) 5 MG tablet   Other Visit Diagnoses     Screening  for colon cancer       Relevant Orders   Cologuard   Screening for osteoporosis  Relevant Orders   DG Bone Density   Need for influenza vaccination       Relevant Orders   Flu Vaccine QUAD High Dose(Fluad) (Completed)   Need for pneumococcal 20-valent conjugate vaccination       Relevant Orders   Pneumococcal conjugate vaccine 20-valent (Prevnar 20) (Completed)       Return in about 6 months (around 04/01/2023) for Recheck chronic conditions.    Romilda Garret, NP

## 2022-09-30 NOTE — Assessment & Plan Note (Signed)
We will trial cyclobenzaprine 5 mg nightly as needed.  Sedation precautions reviewed

## 2022-09-30 NOTE — Assessment & Plan Note (Signed)
Pending lab in office

## 2022-10-07 DIAGNOSIS — H26492 Other secondary cataract, left eye: Secondary | ICD-10-CM | POA: Diagnosis not present

## 2022-10-07 DIAGNOSIS — H18592 Other hereditary corneal dystrophies, left eye: Secondary | ICD-10-CM | POA: Diagnosis not present

## 2022-10-07 DIAGNOSIS — H43813 Vitreous degeneration, bilateral: Secondary | ICD-10-CM | POA: Diagnosis not present

## 2022-10-07 DIAGNOSIS — H52203 Unspecified astigmatism, bilateral: Secondary | ICD-10-CM | POA: Diagnosis not present

## 2022-10-07 DIAGNOSIS — H18602 Keratoconus, unspecified, left eye: Secondary | ICD-10-CM | POA: Diagnosis not present

## 2022-10-10 ENCOUNTER — Encounter: Payer: Self-pay | Admitting: Nurse Practitioner

## 2022-10-10 DIAGNOSIS — F341 Dysthymic disorder: Secondary | ICD-10-CM

## 2022-10-10 DIAGNOSIS — M6289 Other specified disorders of muscle: Secondary | ICD-10-CM

## 2022-10-11 ENCOUNTER — Other Ambulatory Visit: Payer: Self-pay | Admitting: Nurse Practitioner

## 2022-10-11 DIAGNOSIS — M797 Fibromyalgia: Secondary | ICD-10-CM

## 2022-10-12 MED ORDER — CYCLOBENZAPRINE HCL 5 MG PO TABS
5.0000 mg | ORAL_TABLET | Freq: Every evening | ORAL | 2 refills | Status: DC | PRN
Start: 2022-10-12 — End: 2024-10-11

## 2022-10-14 ENCOUNTER — Other Ambulatory Visit (INDEPENDENT_AMBULATORY_CARE_PROVIDER_SITE_OTHER): Payer: Medicare HMO

## 2022-10-14 DIAGNOSIS — E039 Hypothyroidism, unspecified: Secondary | ICD-10-CM

## 2022-10-14 DIAGNOSIS — R7303 Prediabetes: Secondary | ICD-10-CM

## 2022-10-14 DIAGNOSIS — Z0000 Encounter for general adult medical examination without abnormal findings: Secondary | ICD-10-CM

## 2022-10-14 DIAGNOSIS — E559 Vitamin D deficiency, unspecified: Secondary | ICD-10-CM

## 2022-10-14 DIAGNOSIS — Z6841 Body Mass Index (BMI) 40.0 and over, adult: Secondary | ICD-10-CM | POA: Diagnosis not present

## 2022-10-14 LAB — LIPID PANEL
Cholesterol: 220 mg/dL — ABNORMAL HIGH (ref 0–200)
HDL: 67.2 mg/dL (ref >39.00)
LDL Cholesterol: 133 mg/dL — ABNORMAL HIGH (ref 0–99)
NonHDL: 152.73
Total CHOL/HDL Ratio: 3
Triglycerides: 101 mg/dL (ref 0.0–149.0)
VLDL: 20.2 mg/dL (ref 0.0–40.0)

## 2022-10-14 LAB — VITAMIN D 25 HYDROXY (VIT D DEFICIENCY, FRACTURES): VITD: 31.02 ng/mL (ref 30.00–100.00)

## 2022-10-14 LAB — COMPREHENSIVE METABOLIC PANEL
ALT: 16 U/L (ref 0–35)
AST: 18 U/L (ref 0–37)
Albumin: 4.2 g/dL (ref 3.5–5.2)
Alkaline Phosphatase: 69 U/L (ref 39–117)
BUN: 12 mg/dL (ref 6–23)
CO2: 32 mEq/L (ref 19–32)
Calcium: 9.8 mg/dL (ref 8.4–10.5)
Chloride: 106 mEq/L (ref 96–112)
Creatinine, Ser: 0.82 mg/dL (ref 0.40–1.20)
GFR: 73.45 mL/min (ref >60.00)
Glucose, Bld: 85 mg/dL (ref 70–99)
Potassium: 4.2 mEq/L (ref 3.5–5.1)
Sodium: 143 mEq/L (ref 135–145)
Total Bilirubin: 0.7 mg/dL (ref 0.2–1.2)
Total Protein: 7 g/dL (ref 6.0–8.3)

## 2022-10-14 LAB — HEMOGLOBIN A1C: Hgb A1c MFr Bld: 5.8 % (ref 4.6–6.5)

## 2022-10-14 LAB — CBC
HCT: 44.3 % (ref 36.0–46.0)
Hemoglobin: 14.7 g/dL (ref 12.0–15.0)
MCHC: 33.1 g/dL (ref 30.0–36.0)
MCV: 88.6 fl (ref 78.0–100.0)
Platelets: 262 10*3/uL (ref 150.0–400.0)
RBC: 5.01 Mil/uL (ref 3.87–5.11)
RDW: 14.6 % (ref 11.5–15.5)
WBC: 9.4 10*3/uL (ref 4.0–10.5)

## 2022-10-14 LAB — TSH: TSH: 1.73 u[IU]/mL (ref 0.35–5.50)

## 2022-10-27 ENCOUNTER — Other Ambulatory Visit: Payer: Self-pay | Admitting: Family

## 2022-10-27 DIAGNOSIS — N904 Leukoplakia of vulva: Secondary | ICD-10-CM

## 2022-10-30 LAB — COLOGUARD

## 2022-11-11 MED ORDER — BUPROPION HCL ER (XL) 300 MG PO TB24: 300.0000 mg | ORAL_TABLET | Freq: Every day | ORAL | 0 refills | Status: DC

## 2022-11-11 NOTE — Addendum Note (Signed)
Addended by: Michela Pitcher on: 11/11/2022 07:22 AM   Modules accepted: Orders

## 2022-12-01 LAB — COLOGUARD

## 2022-12-03 ENCOUNTER — Ambulatory Visit: Payer: Medicare HMO | Admitting: Primary Care

## 2022-12-03 ENCOUNTER — Encounter: Payer: Self-pay | Admitting: Primary Care

## 2022-12-03 VITALS — BP 130/68 | HR 52 | Ht <= 58 in | Wt 242.2 lb

## 2022-12-03 DIAGNOSIS — G4733 Obstructive sleep apnea (adult) (pediatric): Secondary | ICD-10-CM

## 2022-12-03 NOTE — Patient Instructions (Addendum)
Look at cushions online for headgear/nasal mask   Orders: Renew CPAP supplies-change DME from [place] to [employer]   Follow-up: 1 year follow-up with [facility] NP or sooner if needed   CPAP and BIPAP Information CPAP and BIPAP are methods that use air pressure to keep your airways open and to help you breathe well. CPAP and BIPAP use different amounts of pressure. Your health care provider will tell you whether CPAP or BIPAP would be more helpful for you. CPAP stands for "continuous positive airway pressure." With CPAP, the amount of pressure stays the same while you breathe in (inhale) and out (exhale). BIPAP stands for "bi-level positive airway pressure." With BIPAP, the amount of pressure will be higher when you inhale and lower when you exhale. This allows you to take larger breaths. CPAP or BIPAP may be used in the hospital, or your health care provider may want you to use it at home. You may need to have a sleep study before your health care provider can order a machine for you to use at home. What are the advantages? CPAP or BIPAP can be helpful if you have: Sleep apnea. Chronic obstructive pulmonary disease (COPD). Heart failure. Medical conditions that cause muscle weakness, including muscular dystrophy or amyotrophic lateral sclerosis (ALS). Other problems that cause breathing to be shallow, weak, abnormal, or difficult. CPAP and BIPAP are most commonly used for obstructive sleep apnea (OSA) to keep the airways from collapsing when the muscles relax during sleep. What are the risks? Generally, this is a safe treatment. However, problems may occur, including: Irritated skin or skin sores if the mask does not fit properly. Dry or stuffy nose or nosebleeds. Dry mouth. Feeling gassy or bloated. Sinus or lung infection if the equipment is not cleaned properly. When should CPAP or BIPAP be used? In most cases, the mask only needs to be worn during sleep. Generally, the mask needs to be worn  throughout the night and during any daytime naps. People with certain medical conditions may also need to wear the mask at other times, such as when they are awake. Follow instructions from your health care provider about when to use the machine. What happens during CPAP or BIPAP?  Both CPAP and BIPAP are provided by a small machine with a flexible plastic tube that attaches to a plastic mask that you wear. Air is blown through the mask into your nose or mouth. The amount of pressure that is used to blow the air can be adjusted on the machine. Your health care provider will set the pressure setting and help you find the best mask for you. Tips for using the mask Because the mask needs to be snug, some people feel trapped or closed-in (claustrophobic) when first using the mask. If you feel this way, you may need to get used to the mask. One way to do this is to hold the mask loosely over your nose or mouth and then gradually apply the mask more snugly. You can also gradually increase the amount of time that you use the mask. Masks are available in various types and sizes. If your mask does not fit well, talk with your health care provider about getting a different one. Some common types of masks include: Full face masks, which fit over the mouth and nose. Nasal masks, which fit over the nose. Nasal pillow or prong masks, which fit into the nostrils. If you are using a mask that fits over your nose and you tend to  breathe through your mouth, a chin strap may be applied to help keep your mouth closed. Use a skin barrier to protect your skin as told by your health care provider. Some CPAP and BIPAP machines have alarms that may sound if the mask comes off or develops a leak. If you have trouble with the mask, it is very important that you talk with your health care provider about finding a way to make the mask easier to tolerate. Do not stop using the mask. There could be a negative impact on your health if  you stop using the mask. Tips for using the machine Place your CPAP or BIPAP machine on a secure table or stand near an electrical outlet. Know where the on/off switch is on the machine. Follow instructions from your health care provider about how to set the pressure on your machine and when you should use it. Do not eat or drink while the CPAP or BIPAP machine is on. Food or fluids could get pushed into your lungs by the pressure of the CPAP or BIPAP. For home use, CPAP and BIPAP machines can be rented or purchased through home health care companies. Many different brands of machines are available. Renting a machine before purchasing may help you find out which particular machine works well for you. Your health insurance company may also decide which machine you may get. Keep the CPAP or BIPAP machine and attachments clean. Ask your health care provider for specific instructions. Check the humidifier if you have a dry stuffy nose or nosebleeds. Make sure it is working correctly. Follow these instructions at home: Take over-the-counter and prescription medicines only as told by your health care provider. Ask if you can take sinus medicine if your sinuses are blocked. Do not use any products that contain nicotine or tobacco. These products include cigarettes, chewing tobacco, and vaping devices, such as e-cigarettes. If you need help quitting, ask your health care provider. Keep all follow-up visits. This is important. Contact a health care provider if: You have redness or pressure sores on your head, face, mouth, or nose from the mask or head gear. You have trouble using the CPAP or BIPAP machine. You cannot tolerate wearing the CPAP or BIPAP mask. Someone tells you that you snore even when wearing your CPAP or BIPAP. Get help right away if: You have trouble breathing. You feel confused. Summary CPAP and BIPAP are methods that use air pressure to keep your airways open and to help you breathe  well. If you have trouble with the mask, it is very important that you talk with your health care provider about finding a way to make the mask easier to tolerate. Do not stop using the mask. There could be a negative impact to your health if you stop using the mask. Follow instructions from your health care provider about when to use the machine. This information is not intended to replace advice given to you by your health care provider. Make sure you discuss any questions you have with your health care provider. Document Revised: 07/23/2021 Document Reviewed: 11/22/2020 Elsevier Patient Education  [place].

## 2022-12-03 NOTE — Progress Notes (Signed)
$'@Patient'P$  ID: Joan Russo, female    DOB: 1956-12-23, 66 y.o.   MRN: [id number]  Chief Complaint  Patient presents with   Follow-up    Pt is here for overdue follow up for her osa. Pt states that she is needing new supplies for her cpap. She states no issues noted with her cpap machine and is wearing it every night.     Referring provider: Michela Pitcher, NP  HPI: 66 year old female, never smoked. PMH significant for OSA, hypertension, hiatal hernia, osteopenia, hypothyroidism, vit D deficiency.  Patient of Dr. Halford Chessman.   Previous [facility] pulmonary encounter:  09/25/20 She was previously seen by Dr. Gwenette Greet.  Had home sleep study in 2016.  Showed moderate obstructive sleep apnea.  She was started on auto CPAP and then changed to CPAP 8 cm H2O.  She got supplies through [facility].  She had COVID 19 pneumonia in February 2021.  Chest xray from 02/21/20 showed patchy infiltrates Lt > Rt.  She hasn't had f/u CXR. She was needing to use albuterol when she had pneumonia, but not since.  She has occasional cough with clear sputum.  Mostly in the morning and thinks this comes from post nasal drip after wearing CPAP.  Not having wheeze, fever, chest pain, leg swelling.  Has gained weight since she had COVID.  As a result she gets winded more easily when walking, but recovers after few minutes of resting.  She goes to sleep at 10 pm.  She falls asleep 5 minutes.  She wakes up some times to use the bathroom.  She gets out of bed at 7 am.  She feels okay in the morning.  She denies morning headache.  She does not use anything to help her fall sleep or stay awake.  She denies sleep walking, sleep talking, bruxism, or nightmares.  There is no history of restless legs.  She denies sleep hallucinations, sleep paralysis, or cataplexy.  The Epworth score is 4 out of 24.   12/03/2022- interim hx  Patient presents today for overdue follow-up sleep apnea.  She is doing well. She is compliant with CPAP use.  No  issues with mask fit or pressure settings. She reports benefit in sleep with CPAP use. No residual daytime sleepiness. No respiratory complaints. She needs to change DME companies from [facility] to [place].   [facility] download 09/03/2022 - 12/01/2022 Usage days 89/90 days; 98% greater than 4 hours Average usage 11 hours 29 minutes Pressure 8 to 15 cm H2O (14.5 cm H2O-95%) Air leaks 26.1 L/min (95%) AHI 0.5   No Known Allergies  Immunization History  Administered Date(s) Administered   Fluad Quad(high Dose 65+) 08/29/2019, 09/05/2020, 09/17/2021, 09/30/2022   Influenza Split 10/13/2011   Influenza,inj,Quad PF,6+ Mos 09/19/2014, 10/14/2015, 08/28/2017, 10/17/2018   PFIZER(Purple Top)SARS-COV-2 Vaccination 09/29/2020, 12/09/2020   PNEUMOCOCCAL CONJUGATE-20 09/30/2022    Past Medical History:  Diagnosis Date   Anxiety    Anxiety and depression    Depression    Diverticulosis    GERD (gastroesophageal reflux disease)    Internal hemorrhoids    OA (osteoarthritis)    Obesity, Class III, BMI 40-49.9 (morbid obesity) ([place])    Osteopenia    Thyroid disease    Tubular adenoma    Urinary incontinence    Vitamin D deficiency     Tobacco History: Social History   Tobacco Use  Smoking Status Never  Smokeless Tobacco Never   Counseling given: Not Answered   Outpatient Medications Prior to Visit  Medication Sig Dispense Refill   ALPRAZolam (XANAX) 0.5 MG tablet TAKE 1 TABLET BY MOUTH AT BEDTIME AS NEEDED FOR ANXIETY. 20 tablet 0   buPROPion (WELLBUTRIN XL) 300 MG 24 hr tablet Take 1 tablet (300 mg total) by mouth daily. 90 tablet 0   cyclobenzaprine (FLEXERIL) 5 MG tablet Take 1 tablet (5 mg total) by mouth at bedtime as needed for muscle spasms. 30 tablet 2   fluticasone (FLONASE) 50 MCG/ACT nasal spray Place 2 sprays into both nostrils daily. 16 g 6   ibuprofen (ADVIL) 600 MG tablet Take 1 tablet (600 mg total) by mouth every 6 (six) hours as needed. 60 tablet 3   levothyroxine  (SYNTHROID) 50 MCG tablet TAKE 1 TABLET BY MOUTH EVERY DAY 90 tablet 3   Multiple Vitamin (MULTIVITAMIN WITH MINERALS) TABS tablet Take 1 tablet by mouth daily.     NON FORMULARY CBD GUMMIES -to help with knee pain     pregabalin (LYRICA) 75 MG capsule TAKE 1 CAPSULE BY MOUTH TWICE A DAY 60 capsule 3   No facility-administered medications prior to visit.    Review of Systems  Review of Systems  Constitutional: Negative.   HENT: Negative.    Respiratory: Negative.    Cardiovascular: Negative.    Physical Exam  BP 130/68 (BP Location: Right Arm, Patient Position: Sitting, Cuff Size: Normal)   Pulse (!) 52   Ht '4\' 10"'$  (1.473 m)   Wt 242 lb 3.2 oz (109.9 kg)   SpO2 93%   BMI 50.62 kg/m  Physical Exam Constitutional:      General: She is not in acute distress.    Appearance: Normal appearance. She is not ill-appearing.  HENT:     Head: Normocephalic and atraumatic.     Mouth/Throat:     Mouth: Mucous membranes are moist.     Pharynx: Oropharynx is clear.  Cardiovascular:     Rate and Rhythm: Normal rate and regular rhythm.  Pulmonary:     Effort: Pulmonary effort is normal.     Breath sounds: Normal breath sounds.  Skin:    General: Skin is warm and dry.  Neurological:     General: No focal deficit present.     Mental Status: She is alert and oriented to person, place, and time. Mental status is at baseline.  Psychiatric:        Mood and Affect: Mood normal.        Behavior: Behavior normal.        Thought Content: Thought content normal.        Judgment: Judgment normal.      Lab Results:  CBC    Component Value Date/Time   WBC 9.4 10/14/2022 1159   RBC 5.01 10/14/2022 1159   HGB 14.7 10/14/2022 1159   HCT 44.3 10/14/2022 1159   PLT 262.0 10/14/2022 1159   MCV 88.6 10/14/2022 1159   MCH 29.4 07/17/2022 1740   MCHC 33.1 10/14/2022 1159   RDW 14.6 10/14/2022 1159   LYMPHSABS 2.1 07/17/2022 1740   MONOABS 1.0 07/17/2022 1740   EOSABS 0.0 07/17/2022 1740    BASOSABS 0.0 07/17/2022 1740    BMET    Component Value Date/Time   NA 143 10/14/2022 1159   K 4.2 10/14/2022 1159   CL 106 10/14/2022 1159   CO2 32 10/14/2022 1159   GLUCOSE 85 10/14/2022 1159   BUN 12 10/14/2022 1159   CREATININE 0.82 10/14/2022 1159   CALCIUM 9.8 10/14/2022 1159   GFRNONAA 58 (  L) 07/17/2022 1740   GFRAA >60 03/27/2020 1113    BNP    Component Value Date/Time   BNP 47.0 10/04/2015 1114    ProBNP    Component Value Date/Time   PROBNP 21.0 05/08/2022 1251    Imaging: No results found.   Assessment & Plan:   OSA on CPAP - Well controlled. Home sleep study 02/2015>>moderate OSA, AHI 21/hr. Patient is maintained on auto CPAP.  She is 98% compliant with use > 4 hours over the last 90 days. She reports benefit from use. Current pressure settings to 5-15 cm H2O; Residual AHI 0.5.  Placing an order to change DME companies from [facility] to [facility].  Continue to encourage weight loss efforts.  Advised against driving experiencing excessive daytime sleepiness fatigue.  Follow-up in 1 year or sooner if needed.   Martyn Ehrich, NP 12/03/2022

## 2022-12-03 NOTE — Assessment & Plan Note (Signed)
-   Well controlled. Home sleep study 02/2015>>moderate OSA, AHI 21/hr. Patient is maintained on auto CPAP.  She is 98% compliant with use > 4 hours over the last 90 days. She reports benefit from use. Current pressure settings to 5-15 cm H2O; Residual AHI 0.5.  Placing an order to change DME companies from [facility] to [facility].  Continue to encourage weight loss efforts.  Advised against driving experiencing excessive daytime sleepiness fatigue.  Follow-up in 1 year or sooner if needed.

## 2022-12-07 ENCOUNTER — Encounter: Payer: Self-pay | Admitting: Nurse Practitioner

## 2022-12-07 ENCOUNTER — Ambulatory Visit (INDEPENDENT_AMBULATORY_CARE_PROVIDER_SITE_OTHER): Payer: Medicare HMO | Admitting: Nurse Practitioner

## 2022-12-07 VITALS — BP 116/84 | HR 67 | Temp 97.9°F | Resp 16 | Ht <= 58 in | Wt 241.1 lb

## 2022-12-07 DIAGNOSIS — Z760 Encounter for issue of repeat prescription: Secondary | ICD-10-CM | POA: Diagnosis not present

## 2022-12-07 DIAGNOSIS — R197 Diarrhea, unspecified: Secondary | ICD-10-CM | POA: Diagnosis not present

## 2022-12-07 MED ORDER — CLOBETASOL PROPIONATE 0.05 % EX CREA: TOPICAL_CREAM | CUTANEOUS | 0 refills | Status: DC

## 2022-12-07 NOTE — Patient Instructions (Addendum)
Nice to see you today I sent in a refill of the cream I want to see you in April as planned. You will need to schedule that appointment when you leave Follow up sooner if you need me

## 2022-12-07 NOTE — Progress Notes (Signed)
Reviewed and agree with assessment/plan.   Chesley Mires, MD [facility] Pulmonary/Critical Care 12/07/2022, 7:05 AM Pager:  [phone]

## 2022-12-07 NOTE — Progress Notes (Signed)
   Acute Office Visit  Subjective:     Patient ID: Joan Russo, female    DOB: July 30, 1956, 66 y.o.   MRN: [id number]  Chief Complaint  Patient presents with   Diarrhea    Has a lot of mucus in it.     Patient is in today for Diarrhea  States that it started when the cologuard test 10/14/2022. States that she noticed it was diarrhea and mucous. It would go one or the other. States that she also experienced some constipation . No pain no blood.  States that the last time she has this was approx 1 week ago.   States that she was taking probiotic and has stopped. Review of Systems  Constitutional:  Negative for chills and fever.  Respiratory:  Negative for shortness of breath.   Cardiovascular:  Negative for chest pain.  Gastrointestinal:  Positive for constipation and diarrhea. Negative for abdominal pain, blood in stool, melena, nausea and vomiting.       Mucous in stool        Objective:    BP 116/84   Pulse 67   Temp 97.9 F (36.6 C)   Resp 16   Ht '4\' 10"'$  (1.473 m)   Wt 241 lb 2 oz (109.4 kg)   SpO2 98%   BMI 50.40 kg/m    Physical Exam Vitals and nursing note reviewed.  Constitutional:      Appearance: Normal appearance.  Cardiovascular:     Rate and Rhythm: Normal rate and regular rhythm.     Heart sounds: Normal heart sounds.  Pulmonary:     Effort: Pulmonary effort is normal.     Breath sounds: Normal breath sounds.  Abdominal:     General: Bowel sounds are normal. There is no distension.     Palpations: There is no mass.     Tenderness: There is no abdominal tenderness.     Hernia: No hernia is present.  Neurological:     Mental Status: She is alert.     No results found for any visits on 12/07/22.      Assessment & Plan:   Problem List Items Addressed This Visit       Other   Diarrhea    Has been approximately 1 week since patient has had diarrhea.  States it was only maybe an episode today nothing excessive.  Stable she can  follow-up if problem returns      Other Visit Diagnoses     Medication refill    -  Primary   Relevant Medications   clobetasol cream (TEMOVATE) 0.05 %       Meds ordered this encounter  Medications   clobetasol cream (TEMOVATE) 0.05 %    Sig: Apply 1 application topically 2 (two) times daily. X 6 wks, then drop to once daily x 6 wks, then 2x/wk    Dispense:  30 g    Refill:  0    Order Specific Question:   Supervising Provider    Answer:   Loura Pardon A [[id number]]    Return in about 4 months (around 04/08/2023) for 6 month recheck .  Romilda Garret, NP

## 2022-12-07 NOTE — Assessment & Plan Note (Signed)
Has been approximately 1 week since patient has had diarrhea.  States it was only maybe an episode today nothing excessive.  Stable she can follow-up if problem returns

## 2023-01-04 ENCOUNTER — Other Ambulatory Visit: Payer: Self-pay | Admitting: Nurse Practitioner

## 2023-01-04 DIAGNOSIS — F32A Depression, unspecified: Secondary | ICD-10-CM

## 2023-01-04 MED ORDER — ALPRAZOLAM 0.5 MG PO TABS
ORAL_TABLET | ORAL | 0 refills | Status: DC
Start: 2023-01-04 — End: 2023-10-21

## 2023-01-04 NOTE — Telephone Encounter (Signed)
LOV 12/07/22 Next OV  06/08/23 Last refill 09/11/22, #30, 0 refills  Please review, thanks!

## 2023-01-04 NOTE — Telephone Encounter (Signed)
Prescription Request  01/04/2023  Is this a "Controlled Substance" medicine? No  LOV: 12/07/2022  What is the name of the medication or equipment? ALPRAZolam (XANAX) 0.5 MG tablet   Have you contacted your pharmacy to request a refill? No   Which pharmacy would you like this sent to?  [id number] [place] [facility] [id number] [place] [id number]: [phone]: [phone]   Patient notified that their request is being sent to the clinical staff for review and that they should receive a response within 2 business days.   Please advise at Mobile [phone](mobile)

## 2023-02-08 ENCOUNTER — Other Ambulatory Visit: Payer: Self-pay | Admitting: Nurse Practitioner

## 2023-02-08 DIAGNOSIS — F341 Dysthymic disorder: Secondary | ICD-10-CM

## 2023-02-22 ENCOUNTER — Other Ambulatory Visit: Payer: Self-pay | Admitting: Nurse Practitioner

## 2023-02-22 DIAGNOSIS — M797 Fibromyalgia: Secondary | ICD-10-CM

## 2023-02-24 ENCOUNTER — Other Ambulatory Visit: Payer: Self-pay | Admitting: Nurse Practitioner

## 2023-03-12 ENCOUNTER — Encounter: Payer: Self-pay | Admitting: Family Medicine

## 2023-03-12 ENCOUNTER — Ambulatory Visit (INDEPENDENT_AMBULATORY_CARE_PROVIDER_SITE_OTHER): Payer: Medicare HMO | Admitting: Family Medicine

## 2023-03-12 ENCOUNTER — Other Ambulatory Visit: Payer: Self-pay

## 2023-03-12 VITALS — BP 112/78 | HR 73 | Ht <= 58 in | Wt 229.4 lb

## 2023-03-12 DIAGNOSIS — M25561 Pain in right knee: Secondary | ICD-10-CM

## 2023-03-12 DIAGNOSIS — M1711 Unilateral primary osteoarthritis, right knee: Secondary | ICD-10-CM

## 2023-03-12 DIAGNOSIS — G8929 Other chronic pain: Secondary | ICD-10-CM

## 2023-03-12 DIAGNOSIS — M7062 Trochanteric bursitis, left hip: Secondary | ICD-10-CM | POA: Diagnosis not present

## 2023-03-12 NOTE — Patient Instructions (Signed)
Thank you for coming in today.   You received an injection today. Seek immediate medical attention if the joint becomes red, extremely painful, or is oozing fluid.   Check back in 3 months 

## 2023-03-12 NOTE — Progress Notes (Signed)
Shirlyn Goltz, PhD, LAT, ATC acting as a scribe for Joan Leader, MD.  JOSELYNNE Russo is a 67 y.o. female who presents to [place] at [facility] today for reoccurring right knee and left hip pain.  Patient was last seen by Dr. Georgina Snell on 08/04/2022 and was given a right knee and left GT steroid injections and was referred for a bariatric surgery consultation.   Today, patient reports right knee pain x 3+ months. Denies swelling. Mechanical sx present, clicking and catching. Difficulty with full flexion. Sx worse with ambulation and position change. Also c/o left hip pain x 3+ mos. Has recently been dirving back and forth to [place] to help take care of her daughter.  Unfortunately her daughter was diagnosed with cancer and subsequently died from her cancer.  Dx imaging: 11/26/20 L hip XR             07/30/20 R knee   Pertinent review of systems: No fevers or chills  Relevant historical information: Hypertension.   Exam:  BP 112/78   Pulse 73   Ht 4\' 10"  (1.473 m)   Wt 229 lb 6.4 oz (104.1 kg)   SpO2 99%   BMI 47.94 kg/m  General: Well Developed, well nourished, and in no acute distress.   MSK: Right knee: Normal-appearing with mild effusion. Normal motion with crepitation.  Tender palpation medial joint line. Stable ligamentous exam. Intact strength.  Left hip: Normal-appearing Tender palpation greater trochanter. Hip abduction strength is diminished.    Lab and Radiology Results  Procedure: Real-time Ultrasound Guided Injection of right knee superior lateral patellar space Device: Philips Affiniti 50G Images permanently stored and available for review in PACS Verbal informed consent obtained.  Discussed risks and benefits of procedure. Warned about infection, bleeding, hyperglycemia damage to structures among others. Patient expresses understanding and agreement Time-out conducted.   Noted no overlying erythema, induration, or other signs of  local infection.   Skin prepped in a sterile fashion.   Local anesthesia: Topical Ethyl chloride.   With sterile technique and under real time ultrasound guidance: 40 mg of Kenalog and 2 mL Marcaine injected into knee joint. Fluid seen entering the joint capsule.   Completed without difficulty   Pain immediately resolved suggesting accurate placement of the medication.   Advised to call if fevers/chills, erythema, induration, drainage, or persistent bleeding.   Images permanently stored and available for review in the ultrasound unit.  Impression: Technically successful ultrasound guided injection.    Procedure: Real-time Ultrasound Guided Injection of left hip greater trochanter bursa Device: Philips Affiniti 50G Images permanently stored and available for review in PACS Verbal informed consent obtained.  Discussed risks and benefits of procedure. Warned about infection, bleeding, hyperglycemia damage to structures among others. Patient expresses understanding and agreement Time-out conducted.   Noted no overlying erythema, induration, or other signs of local infection.   Skin prepped in a sterile fashion.   Local anesthesia: Topical Ethyl chloride.   With sterile technique and under real time ultrasound guidance: 40 mg of Kenalog and 2 m Marcaine injected into trochanter bursa. Fluid seen entering the bursa.   Completed without difficulty   Pain immediately resolved suggesting accurate placement of the medication.   Advised to call if fevers/chills, erythema, induration, drainage, or persistent bleeding.   Images permanently stored and available for review in the ultrasound unit.  Impression: Technically successful ultrasound guided injection.     EXAM: RIGHT KNEE - 3 VIEW  COMPARISON:  None.   FINDINGS: Tricompartmental degenerative changes are noted worst in the medial joint space with bone-on-bone contact. No joint effusion is seen. No acute fracture is noted.    IMPRESSION: Degenerative change without acute abnormality.     Electronically Signed   By: Inez Catalina M.D.   On: 07/30/2020 16:46   I, Joan Russo, personally (independently) visualized and performed the interpretation of the images attached in this note.     Assessment and Plan: 67 y.o. 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  This is an acute exacerbation of a chronic problem.  Plan for steroid injection today.  She may benefit from trial of hyaluronic acid injection or Zilretta injection.  Plan to recheck in 3 months.  If this shot does not last longer than 3 months we may consider authorization of those gel shots or Zilretta before she comes back.  She will let us know.  Left lateral hip pain thought to be due to trochanteric bursitis.  Again plan for injection.  Continue home exercise program previously taught.  Again this is an acute exacerbation of a chronic problem.   PDMP not reviewed this encounter. Orders Placed This Encounter  Procedures   [place] LIMITED JOINT SPACE STRUCTURES LOW RIGHT(NO LINKED CHARGES)    Order Specific Question:   Reason for Exam (SYMPTOM  OR DIAGNOSIS REQUIRED)    Answer:   Right knee pain    Order Specific Question:   Preferred imaging location?    Answer:   [place]   No orders of the defined types were placed in this encounter.    Discussed warning signs or symptoms. Please see discharge instructions. Patient expresses understanding.   The above documentation has been reviewed and is accurate and complete Joan Russo, M.D.

## 2023-03-15 ENCOUNTER — Telehealth: Payer: Self-pay

## 2023-03-15 ENCOUNTER — Ambulatory Visit: Payer: Medicare HMO | Admitting: Family Medicine

## 2023-03-15 DIAGNOSIS — M1711 Unilateral primary osteoarthritis, right knee: Secondary | ICD-10-CM

## 2023-03-15 NOTE — Telephone Encounter (Signed)
Per visit note 03/12/23:    Lab and Radiology Results   Procedure: Real-time Ultrasound Guided Injection of right knee superior lateral patellar space Device: Philips Affiniti 50G Images permanently stored and available for review in PACS Verbal informed consent obtained.  Discussed risks and benefits of procedure. Warned about infection, bleeding, hyperglycemia damage to structures among others. Patient expresses understanding and agreement Time-out conducted.   Noted no overlying erythema, induration, or other signs of local infection.   Skin prepped in a sterile fashion.   Local anesthesia: Topical Ethyl chloride.   With sterile technique and under real time ultrasound guidance: 40 mg of Kenalog and 2 mL Marcaine injected into knee joint. Fluid seen entering the joint capsule.   Completed without difficulty   Pain immediately resolved suggesting accurate placement of the medication.   Advised to call if fevers/chills, erythema, induration, drainage, or persistent bleeding.   Images permanently stored and available for review in the ultrasound unit.  Impression: Technically successful ultrasound guided injection.       Procedure: Real-time Ultrasound Guided Injection of left hip greater trochanter bursa Device: Philips Affiniti 50G Images permanently stored and available for review in PACS Verbal informed consent obtained.  Discussed risks and benefits of procedure. Warned about infection, bleeding, hyperglycemia damage to structures among others. Patient expresses understanding and agreement Time-out conducted.   Noted no overlying erythema, induration, or other signs of local infection.   Skin prepped in a sterile fashion.   Local anesthesia: Topical Ethyl chloride.   With sterile technique and under real time ultrasound guidance: 40 mg of Kenalog and 2 m Marcaine injected into trochanter bursa. Fluid seen entering the bursa.   Completed without difficulty   Pain immediately  resolved suggesting accurate placement of the medication.   Advised to call if fevers/chills, erythema, induration, drainage, or persistent bleeding.   Images permanently stored and available for review in the ultrasound unit.  Impression: Technically successful ultrasound guided injection.         EXAM: RIGHT KNEE - 3 VIEW   COMPARISON:  None.   FINDINGS: Tricompartmental degenerative changes are noted worst in the medial joint space with bone-on-bone contact. No joint effusion is seen. No acute fracture is noted.   IMPRESSION: Degenerative change without acute abnormality.     Electronically Signed   By: Inez Catalina M.D.   On: 07/30/2020 16:46   I, [name], personally (independently) visualized and performed the interpretation of the images attached in this note.         Assessment and Plan: 67 y.o. 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  This is an acute exacerbation of a chronic problem.  Plan for steroid injection today.  She may benefit from trial of hyaluronic acid injection or Zilretta injection.  Plan to recheck in 3 months.  If this shot does not last longer than 3 months we may consider authorization of those gel shots or Zilretta before she comes back.  She will let us know.   Left lateral hip pain thought to be due to trochanteric bursitis.  Again plan for injection.  Continue home exercise program previously taught.  Again this is an acute exacerbation of a chronic problem.

## 2023-03-15 NOTE — Telephone Encounter (Signed)
Per visit note 03/12/23:   Lab and Radiology Results   Procedure: Real-time Ultrasound Guided Injection of right knee superior lateral patellar space Device: Philips Affiniti 50G Images permanently stored and available for review in PACS Verbal informed consent obtained.  Discussed risks and benefits of procedure. Warned about infection, bleeding, hyperglycemia damage to structures among others. Patient expresses understanding and agreement Time-out conducted.   Noted no overlying erythema, induration, or other signs of local infection.   Skin prepped in a sterile fashion.   Local anesthesia: Topical Ethyl chloride.   With sterile technique and under real time ultrasound guidance: 40 mg of Kenalog and 2 mL Marcaine injected into knee joint. Fluid seen entering the joint capsule.   Completed without difficulty   Pain immediately resolved suggesting accurate placement of the medication.   Advised to call if fevers/chills, erythema, induration, drainage, or persistent bleeding.   Images permanently stored and available for review in the ultrasound unit.  Impression: Technically successful ultrasound guided injection.       Procedure: Real-time Ultrasound Guided Injection of left hip greater trochanter bursa Device: Philips Affiniti 50G Images permanently stored and available for review in PACS Verbal informed consent obtained.  Discussed risks and benefits of procedure. Warned about infection, bleeding, hyperglycemia damage to structures among others. Patient expresses understanding and agreement Time-out conducted.   Noted no overlying erythema, induration, or other signs of local infection.   Skin prepped in a sterile fashion.   Local anesthesia: Topical Ethyl chloride.   With sterile technique and under real time ultrasound guidance: 40 mg of Kenalog and 2 m Marcaine injected into trochanter bursa. Fluid seen entering the bursa.   Completed without difficulty   Pain immediately  resolved suggesting accurate placement of the medication.   Advised to call if fevers/chills, erythema, induration, drainage, or persistent bleeding.   Images permanently stored and available for review in the ultrasound unit.  Impression: Technically successful ultrasound guided injection.         EXAM: RIGHT KNEE - 3 VIEW   COMPARISON:  None.   FINDINGS: Tricompartmental degenerative changes are noted worst in the medial joint space with bone-on-bone contact. No joint effusion is seen. No acute fracture is noted.   IMPRESSION: Degenerative change without acute abnormality.     Electronically Signed   By: Inez Catalina M.D.   On: 07/30/2020 16:46   I, [name], personally (independently) visualized and performed the interpretation of the images attached in this note.         Assessment and Plan: 67 y.o. 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  This is an acute exacerbation of a chronic problem.  Plan for steroid injection today.  She may benefit from trial of hyaluronic acid injection or Zilretta injection.  Plan to recheck in 3 months.  If this shot does not last longer than 3 months we may consider authorization of those gel shots or Zilretta before she comes back.  She will let us know.   Left lateral hip pain thought to be due to trochanteric bursitis.  Again plan for injection.  Continue home exercise program previously taught.  Again this is an acute exacerbation of a chronic problem.

## 2023-03-16 NOTE — Telephone Encounter (Signed)
VOB initiated for Zilretta - RIGHT knee OA

## 2023-03-16 NOTE — Telephone Encounter (Signed)
VOB initiated for Orthovisc RIGHT knee OA 

## 2023-03-17 NOTE — Telephone Encounter (Addendum)
Orthovisc - RIGHT knee OA  Ready for ordering/scheduling (also checking benefits for Zilretta)  Copay: $10 Co-insurance: 0% Deductible: does not apply  Prior Auth: NOT required

## 2023-03-18 NOTE — Telephone Encounter (Signed)
Prior auth required for ZILRETTA  A prior authorization is required for this patient's plan. [employer] can assist with the prior authorization. For Prior Auth support, please fax clinical information to [phone], or upload it using the online portal.

## 2023-03-19 NOTE — Telephone Encounter (Signed)
Prior Authorization initiated for [facility] via [email or web address] KEY: [id number]

## 2023-03-19 NOTE — Telephone Encounter (Signed)
Patient would like to wait until her current scheduled appointment and potentially have the injection then.

## 2023-03-29 NOTE — Telephone Encounter (Signed)
Zilretta coverage DENIED  Outcome Denied on March 22 We cover this drug when our criteria are met. The unmet criteria are: has had poor response to trying ONE different shots in the knee (for example: methylprednisolone, betamethasone, dexamethasone). This decision was from [employer]s Zilretta (triamcinolone acetonide extended-release injectable suspension) Pharmacy Coverage policy and/or the [place] Necessity Determinations Policy.

## 2023-03-30 ENCOUNTER — Encounter: Payer: Self-pay | Admitting: *Deleted

## 2023-03-30 ENCOUNTER — Encounter: Payer: Self-pay | Admitting: Nurse Practitioner

## 2023-03-30 ENCOUNTER — Ambulatory Visit (INDEPENDENT_AMBULATORY_CARE_PROVIDER_SITE_OTHER): Payer: Medicare HMO | Admitting: Nurse Practitioner

## 2023-03-30 ENCOUNTER — Telehealth: Payer: Self-pay | Admitting: Nurse Practitioner

## 2023-03-30 VITALS — BP 132/82 | HR 86 | Temp 97.8°F | Resp 16 | Ht <= 58 in | Wt 220.1 lb

## 2023-03-30 DIAGNOSIS — R319 Hematuria, unspecified: Secondary | ICD-10-CM

## 2023-03-30 DIAGNOSIS — R0982 Postnasal drip: Secondary | ICD-10-CM | POA: Diagnosis not present

## 2023-03-30 DIAGNOSIS — N939 Abnormal uterine and vaginal bleeding, unspecified: Secondary | ICD-10-CM | POA: Diagnosis not present

## 2023-03-30 DIAGNOSIS — R8290 Unspecified abnormal findings in urine: Secondary | ICD-10-CM

## 2023-03-30 LAB — POC URINALSYSI DIPSTICK (AUTOMATED)
Glucose, UA: NEGATIVE
Ketones, UA: NEGATIVE
Nitrite, UA: NEGATIVE
Protein, UA: POSITIVE — AB
Spec Grav, UA: >=1.03 — AB (ref 1.010–1.025)
Urobilinogen, UA: NEGATIVE E.U./dL — AB
pH, UA: 5.5 (ref 5.0–8.0)

## 2023-03-30 MED ORDER — FLUTICASONE PROPIONATE 50 MCG/ACT NA SUSP: 2.0000 | Freq: Every day | NASAL | 6 refills | Status: DC

## 2023-03-30 NOTE — Assessment & Plan Note (Signed)
Did notice some vaginal bleeding on pelvic exam will do transvaginal ultrasound refer patient to GYN for further management.  Ultrasound placed today ambulatory referral to GYN placed

## 2023-03-30 NOTE — Assessment & Plan Note (Signed)
Blood and leukocytes.  Pending urine culture as patient did mention she had urinary incontinence

## 2023-03-30 NOTE — Telephone Encounter (Signed)
Patient has appointment today with Romilda Garret, NP   Patient Name: Joan Russo Gender: Female DOB: 02-15-1956 Age: 67 Y 11 M 22 D Return Phone Number: [phone] (Primary), [phone] (Alternate) Address: City/ State/ Zip: [place]  [address] Client [facility] - Client Client Site [place] - Day Provider Romilda Garret- NP Contact Type Call Who Is Calling Patient / Member / Family / Caregiver Call Type Triage / Clinical Relationship To Patient Self Return Phone Number [phone] (Alternate) Chief Complaint Weight Loss Reason for Call Symptomatic / Request for [place] states is having blood in urine and like she is having a period. Losing weight also. Translation No Nurse Assessment Nurse: Alvis Lemmings, RN, Marcie Bal Date/Time Eilene Ghazi Time): 03/30/2023 9:29:03 AM Confirm and document reason for call. If symptomatic, describe symptoms. ---Caller states is having blood in urine and like she is having a period. Losing weight also. Dark urine for about a week, this a.m. bloody urine on panties. Loss of appetite. Grief over loss of daughter from cancer,late Feb. Xanax for anxiety. Does the patient have any new or worsening symptoms? ---Yes Will a triage be completed? ---Yes Related visit to physician within the last 2 weeks? ---No Does the PT have any chronic conditions? (i.e. diabetes, asthma, this includes High risk factors for pregnancy, etc.) ---Yes List chronic conditions. ---xanax, depression/anxiety, Is this a behavioral health or substance abuse call? ---No Guidelines Guideline Title Affirmed Question Affirmed Notes Nurse Date/Time (Eastern Time) Urine - Blood In Blood in urine (Exception: Could be normal menstrual bleeding.) Etter Sjogren 03/30/2023 9:33:52 AM Disp. Time Eilene Ghazi Time) Disposition Final User 03/30/2023 9:09:13 AM Attempt made - message left  D'Heur Macky Lower 03/30/2023 9:25:08 AM Send To RN Personal D'Heur Lucia Gaskins, RN, Adrienne PLEASE NOTE: All timestamps contained within this report are represented as [place] Standard Time. CONFIDENTIALTY NOTICE: This fax transmission is intended only for the addressee. It contains information that is legally privileged, confidential or otherwise protected from use or disclosure. If you are not the intended recipient, you are strictly prohibited from reviewing, disclosing, copying using or disseminating any of this information or taking any action in reliance on or regarding this information. If you have received this fax in error, please notify us immediately by telephone so that we can arrange for its return to [place]. Phone: [phone], Toll-Free: [phone], Fax: [phone] Page: 2 of 2 Call Id: [id number] [place]. Time Eilene Ghazi Time) Disposition Final User 03/30/2023 9:27:09 AM Attempt made - no message left Etter Sjogren 03/30/2023 9:37:58 AM See PCP within 24 Hours Yes Alvis Lemmings, RN, Marcie Bal Final Disposition 03/30/2023 9:37:58 AM See PCP within 24 Hours Yes Alvis Lemmings, RN, Lenon Oms Disagree/Comply Comply Caller Understands Yes PreDisposition Call Doctor Care Advice Given Per Guideline SEE PCP WITHIN 24 HOURS: * IF OFFICE WILL BE OPEN: You need to be examined within the next 24 hours. Call your doctor (or NP/PA) when the office opens and make an appointment. DRINK EXTRA FLUIDS: * Drink extra fluids. CARE ADVICE given per Urine, Blood In (Adult) guideline. * Drink 8 to 10 cups (1,800 to 2,400 ml) of liquids a day. CALL BACK IF: * Fever or pain occurs * You become worse Referrals REFERRED TO PCP OFFIC

## 2023-03-30 NOTE — Telephone Encounter (Signed)
Evaluated in office  

## 2023-03-30 NOTE — Patient Instructions (Signed)
Nice to see you today I have ordered a [place]. Call and get it scheduled  I will be in touch with the results once I have it

## 2023-03-30 NOTE — Progress Notes (Signed)
Acute Office Visit  Subjective:     Patient ID: Joan Russo, female    DOB: [birth year], 67 y.o.   MRN: [id number]  Chief Complaint  Patient presents with   Hematuria    Since this am Bleeding like a period   Weight Loss     Patient is in today for hematuria with a history of HTN, OSA, Hypothyroidism   States that she woke up and noticed blood in her ants and when she peed and wiped her self.  States over the past week she has noticed the urine has been getting darker . States that she has not had any urinary symptoms except she has had 2 episodes of urinary incontinence.  States that her daughter passed away at age 52 form breast cancer that had metastasized.  Patient states she is not interested in grief.  States daughter just passed away approximately 2 months ago   Review of Systems  Constitutional:  Positive for weight loss. Negative for chills and fever.  Gastrointestinal:  Negative for abdominal pain, constipation, diarrhea, nausea and vomiting.  Genitourinary:  Positive for hematuria. Negative for dysuria, frequency and urgency.       "+" incontinence         Objective:    BP 132/82   Pulse 86   Temp 97.8 F (36.6 C)   Resp 16   Ht 4\' 10"  (1.473 m)   Wt 220 lb 2 oz (99.8 kg)   SpO2 99%   BMI 46.01 kg/m  BP Readings from Last 3 Encounters:  03/30/23 132/82  03/12/23 112/78  12/07/22 116/84   Wt Readings from Last 3 Encounters:  03/30/23 220 lb 2 oz (99.8 kg)  03/12/23 229 lb 6.4 oz (104.1 kg)  12/07/22 241 lb 2 oz (109.4 kg)      Physical Exam Vitals and nursing note reviewed. Exam conducted with a chaperone present [employer], CMA).  Constitutional:      Appearance: Normal appearance.  Cardiovascular:     Rate and Rhythm: Normal rate and regular rhythm.     Heart sounds: Normal heart sounds.  Pulmonary:     Effort: Pulmonary effort is normal.     Breath sounds: Normal breath sounds.  Abdominal:     General: Bowel sounds are  normal. There is no distension.     Palpations: There is no mass.     Tenderness: There is no abdominal tenderness. There is no right CVA tenderness or left CVA tenderness.     Hernia: No hernia is present.  Genitourinary:    Vagina: Bleeding present.     Cervix: Cervical bleeding present.     Uterus: Normal.      Adnexa: Right adnexa normal and left adnexa normal.  Neurological:     Mental Status: She is alert.     Results for orders placed or performed in visit on 03/30/23  POCT Urinalysis Dipstick (Automated)  Result Value Ref Range   Color, UA straw    Clarity, UA cloudy    Glucose, UA Negative Negative   Bilirubin, UA 1+    Ketones, UA Negative    Spec Grav, UA >=1.030 (A) 1.010 - 1.025   Blood, UA 3+    pH, UA 5.5 5.0 - 8.0   Protein, UA Positive (A) Negative   Urobilinogen, UA negative (A) 0.2 or 1.0 E.U./dL   Nitrite, UA Negative    Leukocytes, UA Small (1+) (A) Negative        Assessment &  Plan:   Problem List Items Addressed This Visit       Other   PND (post-nasal drip)   Relevant Medications   fluticasone (FLONASE) 50 MCG/ACT nasal spray   Hematuria - Primary    Patient noticed blood this morning in her underwear and on toilet tissue.  UA did have 3+ blood plus 1+ leukocyte positive urine culture no history of smoking. Thank it is vaginal      Relevant Orders   POCT Urinalysis Dipstick (Automated) (Completed)   Abnormal urinalysis    Blood and leukocytes.  Pending urine culture as patient did mention she had urinary incontinence      Relevant Orders   Urine Culture   Vaginal bleeding    Did notice some vaginal bleeding on pelvic exam will do transvaginal ultrasound refer patient to GYN for further management.  Ultrasound placed today ambulatory referral to GYN placed      Relevant Orders   US Pelvic Complete With Transvaginal   Ambulatory referral to Gynecology    Meds ordered this encounter  Medications   fluticasone (FLONASE) 50 MCG/ACT  nasal spray    Sig: Place 2 sprays into both nostrils daily.    Dispense:  16 g    Refill:  6    Return if symptoms worsen or fail to improve.  Romilda Garret, NP

## 2023-03-30 NOTE — Telephone Encounter (Signed)
FYI: This call has been transferred to Access Nurse. Once the result note has been entered staff can address the message at that time.  Patient called in with the following symptoms:  Red Word: Blood in urine Patient called in stating that she has blood in her urine as if she was having a period. She stated that she has also been losing weight.   Please advise at Mobile [phone] (mobile)  Message is routed to Provider Pool and [facility] Triage

## 2023-03-30 NOTE — Assessment & Plan Note (Signed)
Patient noticed blood this morning in her underwear and on toilet tissue.  UA did have 3+ blood plus 1+ leukocyte positive urine culture no history of smoking. Thank it is vaginal

## 2023-04-02 ENCOUNTER — Other Ambulatory Visit: Payer: Self-pay | Admitting: Nurse Practitioner

## 2023-04-02 DIAGNOSIS — N3001 Acute cystitis with hematuria: Secondary | ICD-10-CM

## 2023-04-02 LAB — URINE CULTURE
MICRO NUMBER:: 14771910
SPECIMEN QUALITY:: ADEQUATE

## 2023-04-02 MED ORDER — NITROFURANTOIN MONOHYD MACRO 100 MG PO CAPS: 100.0000 mg | ORAL_CAPSULE | Freq: Two times a day (BID) | ORAL | 0 refills | Status: AC

## 2023-04-12 ENCOUNTER — Ambulatory Visit
Admission: RE | Admit: 2023-04-12 | Discharge: 2023-04-12 | Disposition: A | Discharge status: Home / Self Care | Payer: Medicare HMO | Source: Ambulatory Visit | Attending: Nurse Practitioner | Admitting: Nurse Practitioner

## 2023-04-12 DIAGNOSIS — N939 Abnormal uterine and vaginal bleeding, unspecified: Secondary | ICD-10-CM

## 2023-04-14 ENCOUNTER — Encounter: Payer: Self-pay | Admitting: *Deleted

## 2023-04-14 ENCOUNTER — Other Ambulatory Visit: Payer: Self-pay | Admitting: Nurse Practitioner

## 2023-04-14 DIAGNOSIS — N858 Other specified noninflammatory disorders of uterus: Secondary | ICD-10-CM

## 2023-04-14 DIAGNOSIS — N939 Abnormal uterine and vaginal bleeding, unspecified: Secondary | ICD-10-CM

## 2023-04-21 ENCOUNTER — Encounter: Payer: Self-pay | Admitting: Certified Nurse Midwife

## 2023-04-21 ENCOUNTER — Ambulatory Visit: Payer: Medicare HMO | Admitting: Certified Nurse Midwife

## 2023-04-21 VITALS — BP 136/100 | HR 68 | Wt 220.7 lb

## 2023-04-21 DIAGNOSIS — N950 Postmenopausal bleeding: Secondary | ICD-10-CM

## 2023-04-21 NOTE — Progress Notes (Addendum)
OB/GYN CONFERENCE NOTE:  Subjective:       Joan Russo a 67 y.o. G13P2002 female who presents for a referral appointment. She was seen by PCP for post menopausal bleeding. She has history of this in 2020 and and endometrial biopsy that notes fragments of benign polyp and blood.  Current complaints include: Post menopausal bleeding x few days that has now resolved.     Gynecologic History No LMP recorded. [name] postmenopausal. Contraception: post menopausal status  Obstetric History OB History  Gravida Para Term Preterm AB Living  2 2 2     2   SAB IAB Ectopic Multiple Live Births          2    # Outcome Date GA Lbr Len/2nd Weight Sex Delivery Anes PTL Lv  2 Term 06/30/77 [redacted]w[redacted]d   F Vag-Spont   LIV  1 Term 05/19/72 [redacted]w[redacted]d   F Vag-Spont   LIV    Past Medical History:  Diagnosis Date   Anxiety    Anxiety and depression    Depression    Diverticulosis    GERD (gastroesophageal reflux disease)    Internal hemorrhoids    OA (osteoarthritis)    Obesity, Class III, BMI 40-49.9 (morbid obesity)    Osteopenia    Thyroid disease    Tubular adenoma    Urinary incontinence    Vitamin D deficiency     Past Surgical History:  Procedure Laterality Date   BREAST BIOPSY Right    COLONOSCOPY WITH PROPOFOL N/A 07/07/2016   Procedure: COLONOSCOPY WITH PROPOFOL;  Surgeon: Joan Fiedler, MD;  Location: [facility];  Service: Gastroenterology;  Laterality: N/A;   ESOPHAGOGASTRODUODENOSCOPY (EGD) WITH PROPOFOL N/A 07/07/2016   Procedure: ESOPHAGOGASTRODUODENOSCOPY (EGD) WITH PROPOFOL;  Surgeon: Joan Fiedler, MD;  Location: [facility];  Service: Gastroenterology;  Laterality: N/A;   HYSTEROSCOPY WITH D & C N/A 01/16/2015   Procedure: DILATATION AND CURETTAGE /HYSTEROSCOPY;  Surgeon: Joan Bores, MD;  Location: [facility];  Service: Gynecology;  Laterality: N/A;   HYSTEROSCOPY WITH D & C N/A 06/06/2019   Procedure: DILATATION AND CURETTAGE /HYSTEROSCOPY;  Surgeon: Joan Milch,  MD;  Location: [facility];  Service: Gynecology;  Laterality: N/A;   JOINT REPLACEMENT     TOTAL KNEE ARTHROPLASTY  08/2008   Left    Current Outpatient Medications on File Prior to Visit  Medication Sig Dispense Refill   ALPRAZolam (XANAX) 0.5 MG tablet TAKE 1 TABLET BY MOUTH AT BEDTIME AS NEEDED FOR ANXIETY. 20 tablet 0   buPROPion (WELLBUTRIN XL) 300 MG 24 hr tablet TAKE 1 TABLET BY MOUTH EVERY DAY 90 tablet 0   clobetasol cream (TEMOVATE) 0.05 % Apply 1 application topically 2 (two) times daily. X 6 wks, then drop to once daily x 6 wks, then 2x/wk 30 g 0   cyclobenzaprine (FLEXERIL) 5 MG tablet Take 1 tablet (5 mg total) by mouth at bedtime as needed for muscle spasms. 30 tablet 2   fluticasone (FLONASE) 50 MCG/ACT nasal spray Place 2 sprays into both nostrils daily. 16 g 6   ibuprofen (ADVIL) 600 MG tablet Take 1 tablet (600 mg total) by mouth every 6 (six) hours as needed. 60 tablet 3   levothyroxine (SYNTHROID) 50 MCG tablet TAKE 1 TABLET BY MOUTH EVERY DAY 90 tablet 3   Multiple Vitamin (MULTIVITAMIN WITH MINERALS) TABS tablet Take 1 tablet by mouth daily.     NON FORMULARY CBD GUMMIES -to help with knee pain  pregabalin (LYRICA) 75 MG capsule TAKE 1 CAPSULE BY MOUTH TWICE A DAY 60 capsule 1   No current facility-administered medications on file prior to visit.    No Known Allergies  Social History   Socioeconomic History   Marital status: Widowed    Spouse name: Not on file   Number of children: 4   Years of education: Not on file   Highest education level: Not on file  Occupational History   Occupation: Science writer  Tobacco Use   Smoking status: Never   Smokeless tobacco: Never  Vaping Use   Vaping Use: Never used  Substance and Sexual Activity   Alcohol use: No    Alcohol/week: 0.0 standard drinks of alcohol   Drug use: No   Sexual activity: Not Currently    Birth control/protection: Post-menopausal  Other Topics Concern   Not on file  Social History  Narrative   Not on file   Social Determinants of Health   Financial Resource Strain: Low Risk  (06/05/2022)   Overall Financial Resource Strain (CARDIA)    Difficulty of Paying Living Expenses: Not hard at all  Food Insecurity: No Food Insecurity (06/05/2022)   Hunger Vital Sign    Worried About Running Out of Food in the Last Year: Never true    Ran Out of Food in the Last Year: Never true  Transportation Needs: No Transportation Needs (06/05/2022)   PRAPARE - Administrator, Civil Service (Medical): No    Lack of Transportation (Non-Medical): No  Physical Activity: Inactive (06/04/2021)   Exercise Vital Sign    Days of Exercise per Week: 0 days    Minutes of Exercise per Session: 0 min  Stress: Stress Concern Present (06/05/2022)   [employer] of Occupational Health - Occupational Stress Questionnaire    Feeling of Stress : To some extent  Social Connections: Socially Isolated (06/05/2022)   Social Connection and Isolation Panel [NHANES]    Frequency of Communication with Friends and Family: More than three times a week    Frequency of Social Gatherings with Friends and Family: Once a week    Attends Religious Services: Never    Database administrator or Organizations: No    Attends Banker Meetings: Never    Marital Status: Widowed  Intimate Partner Violence: Not At Risk (06/05/2022)   Humiliation, Afraid, Rape, and Kick questionnaire    Fear of Current or Ex-Partner: No    Emotionally Abused: No    Physically Abused: No    Sexually Abused: No    Family History  Problem Relation Age of Onset   Cancer Mother 30       breast   Breast cancer Mother 71   Cancer Father    Cancer Daughter 49       breast   Breast cancer Daughter 57   Colon cancer Neg Hx    Esophageal cancer Neg Hx    Rectal cancer Neg Hx    Stomach cancer Neg Hx     The following portions of the patient's history were reviewed and updated as appropriate: allergies, current  medications, past family history, past medical history, past social history, past surgical history and problem list.  Review of Systems Pertinent items are noted in HPI.   Objective:   BP (!) 136/100   Pulse 68   Wt 220 lb 11.2 oz (100.1 kg)   BMI 46.13 kg/m    Assessment/Plan:   Patient Active Problem List  Diagnosis Date Noted   Hematuria 03/30/2023   Abnormal urinalysis 03/30/2023   Vaginal bleeding 03/30/2023   Diarrhea 12/07/2022   Muscle tightness 09/30/2022   Viral gastroenteritis 09/08/2022   PND (post-nasal drip) 06/24/2022   Adventitious breath sounds 04/28/2022   Acute cough 04/28/2022   SOB (shortness of breath) 04/28/2022   Bloating 12/03/2021   Abdominal discomfort 12/03/2021   Dysuria 12/03/2021   Other proteinuria 12/03/2021   History of depression 02/01/2021   Tremor 02/01/2021   Class 3 severe obesity due to excess calories without serious comorbidity with body mass index (BMI) of 50.0 to 59.9 in adult 01/31/2021   Memory changes 12/17/2020   Vertigo 12/17/2020   Prediabetes 12/17/2020   Loss of memory 12/17/2020   Degenerative joint disease of knee, right 03/30/2020   History of COVID-19 03/12/2020   Neuropathic pain 12/15/2016   Hiatal hernia    Esophageal web    History of colonic polyps    Fibromyalgia 12/06/2015   OSA (obstructive sleep apnea) 02/19/2015   OSA on CPAP 02/19/2015   Elevated C-reactive protein (CRP) 01/31/2015   Elevated sed rate 01/31/2015   HTN (hypertension) 01/31/2015   Hypothyroidism 01/08/2015   Osteopenia 12/24/2014   Lichen sclerosus of female genitalia 12/24/2014   Upper respiratory tract infection 12/21/2011   Polyarthralgia 12/01/2011   Preventative health care 12/01/2011   Other fatigue 10/13/2011   BMI 50.0-59.9, adult    Depression    Vitamin D deficiency     CLINICAL DATA:  Vaginal bleeding.   EXAM: TRANSABDOMINAL AND TRANSVAGINAL ULTRASOUND OF PELVIS   TECHNIQUE: Both transabdominal and  transvaginal ultrasound examinations of the pelvis were performed. Transabdominal technique was performed for global imaging of the pelvis including uterus, ovaries, adnexal regions, and pelvic cul-de-sac. It was necessary to proceed with endovaginal exam following the transabdominal exam to visualize the uterus and endometrium.   COMPARISON:  Apr 28, 2019   FINDINGS: Uterus   Measurements: 5.4 cm x 3.1 cm x 3.6 cm = volume: 31.60 mL. A 1.0 cm x 0.6 cm x 0.8 cm hyperechoic mass Russo seen within the posterior aspect of the uterus on the left.   Endometrium   Thickness: 3.6 mm.  No focal abnormality visualized.   Right ovary   The right ovary Russo not visualized.   Left ovary   The left ovary Russo not visualized.   Other findings   No abnormal free fluid.   IMPRESSION: Small hyperechoic uterine mass which may represent a uterine fibroid. Given its location, association with the endometrium cannot be excluded. Nonemergent MRI correlation Russo recommended.     Electronically Signed   By: Aram Candela M.D.   On: 04/12/2023 16:45  Reviewed u/s results. Consulted Dr. Logan Russo on plan of care. Given endometrium Russo thin and known polyp I discussed watchful waiting vs D&C for management of bleeding. Pt states bleeding Russo not bothersome for her . Discussed completing MRI that was ordered by PCP to get further explanation of polyp and or fibroid. She state she will probably not due MRI as it will not change her bleeding symptoms. She declines D&C as bleeding Russo not bothersome for her. Discussed red flag symptoms and indication for follow. She verbalizes and agrees to plan .   BP elevated , repeat remains elevated pt state she Russo very nevous and has depression. She attributes her elevated pressure today to her nerves. Pt encouraged to have repeat BP when she Russo not stressed.    Time: 15  Return to Clinic: PRN   Doreene Burke, CNM [facility]

## 2023-05-03 ENCOUNTER — Other Ambulatory Visit: Payer: Self-pay | Admitting: Nurse Practitioner

## 2023-05-03 DIAGNOSIS — F341 Dysthymic disorder: Secondary | ICD-10-CM

## 2023-05-03 DIAGNOSIS — M797 Fibromyalgia: Secondary | ICD-10-CM

## 2023-05-03 DIAGNOSIS — Z760 Encounter for issue of repeat prescription: Secondary | ICD-10-CM

## 2023-05-03 MED ORDER — PREGABALIN 75 MG PO CAPS: 75.0000 mg | ORAL_CAPSULE | Freq: Two times a day (BID) | ORAL | 1 refills | Status: DC

## 2023-05-03 MED ORDER — BUPROPION HCL ER (XL) 300 MG PO TB24
300.0000 mg | ORAL_TABLET | Freq: Every day | ORAL | 1 refills | Status: DC
Start: 2023-05-03 — End: 2023-12-13

## 2023-05-03 MED ORDER — CLOBETASOL PROPIONATE 0.05 % EX CREA
TOPICAL_CREAM | CUTANEOUS | 0 refills | Status: DC
Start: 2023-05-03 — End: 2023-07-05

## 2023-05-25 ENCOUNTER — Encounter: Payer: Self-pay | Admitting: Family Medicine

## 2023-05-25 ENCOUNTER — Other Ambulatory Visit: Payer: Self-pay

## 2023-05-25 ENCOUNTER — Ambulatory Visit (INDEPENDENT_AMBULATORY_CARE_PROVIDER_SITE_OTHER): Payer: Medicare HMO | Admitting: Family Medicine

## 2023-05-25 VITALS — BP 144/86 | HR 74 | Ht <= 58 in | Wt 213.0 lb

## 2023-05-25 DIAGNOSIS — G8929 Other chronic pain: Secondary | ICD-10-CM | POA: Diagnosis not present

## 2023-05-25 DIAGNOSIS — M25561 Pain in right knee: Secondary | ICD-10-CM | POA: Diagnosis not present

## 2023-05-25 DIAGNOSIS — M1711 Unilateral primary osteoarthritis, right knee: Secondary | ICD-10-CM | POA: Diagnosis not present

## 2023-05-25 MED ORDER — HYALURONAN 30 MG/2ML IX SOSY
30.0000 mg | PREFILLED_SYRINGE | Freq: Once | INTRA_ARTICULAR | Status: AC
Start: 2023-05-25 — End: 2023-05-25
  Administered 2023-05-25: 30 mg via INTRA_ARTICULAR

## 2023-05-25 NOTE — Progress Notes (Signed)
   Rubin Payor, PhD, LAT, ATC acting as a scribe for Joan Graham, MD.  Joan Russo is a 67 y.o. female who presents to [facility] today for cont'd R knee pain. Pt was last seen by Dr. Denyse Amass on 03/12/23 and was given a R knee steroid injection and was advised we would work to authorize gel and Zilretta injections.   Today, pt reports minimal improvement with Kenalog injection. Has been having a lot of knee pain since last visit. Intermittent mechanical sx. Some swelling per pt. Also c/o left posterolateral hip pain.   She also notes pain in the left lateral hip.  She had a hip greater trochanter injection almost 3 months ago that worked until recently.  Dx imaging: 07/30/20 R knee XR  Pertinent review of systems: No fevers or chills  Relevant historical information: Hypertension   Exam:  BP (!) 144/86   Pulse 74   Ht 4\' 10"  (1.473 m)   Wt 213 lb (96.6 kg)   SpO2 98%   BMI 44.52 kg/m  General: Well Developed, well nourished, and in no acute distress.   MSK: Right knee mild effusion normal-appearing otherwise normal motion with crepitation.   Right knee Orthovisc injection 1/3 Procedure: Real-time Ultrasound Guided Injection of right knee joint superior lateral patellar space Device: Philips Affiniti 50G Images permanently stored and available for review in PACS Verbal informed consent obtained.  Discussed risks and benefits of procedure. Warned about infection, bleeding, damage to structures among others. Patient expresses understanding and agreement Time-out conducted.   Noted no overlying erythema, induration, or other signs of local infection.   Skin prepped in a sterile fashion.   Local anesthesia: Topical Ethyl chloride.   With sterile technique and under real time ultrasound guidance: Orthovisc 30 mg injected into knee joint. Fluid seen entering the joint capsule.   Completed without difficulty    Advised to call if  fevers/chills, erythema, induration, drainage, or persistent bleeding.   Images permanently stored and available for review in the ultrasound unit.  Impression: Technically successful ultrasound guided injection. Lot number: [id number]      Assessment and Plan: 67 y.o. 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  Plan for Orthovisc injection series starting today.  Return in 1 week for Orthovisc injection right knee 2/3.  She will be at her 10-month mark for left lateral hip injection to the greater trochanter bursitis in about 2 weeks.  We can do that shot and these Orthovisc out same time.   PDMP not reviewed this encounter. Orders Placed This Encounter  Procedures   [place] LIMITED JOINT SPACE STRUCTURES LOW RIGHT(NO LINKED CHARGES)    Order Specific Question:   Reason for Exam (SYMPTOM  OR DIAGNOSIS REQUIRED)    Answer:   right knee pain    Order Specific Question:   Preferred imaging location?    Answer:   Adult nurse [facility] ordered this encounter  Medications   Hyaluronan (ORTHOVISC) intra-articular injection 30 mg     Discussed warning signs or symptoms. Please see discharge instructions. Patient expresses understanding.   The above documentation has been reviewed and is accurate and complete Joan Russo, M.D.

## 2023-05-25 NOTE — Patient Instructions (Signed)
Thank you for coming in today.   You received an injection today. Seek immediate medical attention if the joint becomes red, extremely painful, or is oozing fluid.   Schedule the 2nd visit for next week  At the 3rd week, we will also injection your hip.

## 2023-06-01 ENCOUNTER — Ambulatory Visit: Payer: Medicare HMO | Admitting: Family Medicine

## 2023-06-01 ENCOUNTER — Other Ambulatory Visit: Payer: Self-pay

## 2023-06-01 DIAGNOSIS — M1711 Unilateral primary osteoarthritis, right knee: Secondary | ICD-10-CM | POA: Diagnosis not present

## 2023-06-01 DIAGNOSIS — G8929 Other chronic pain: Secondary | ICD-10-CM

## 2023-06-01 DIAGNOSIS — M25561 Pain in right knee: Secondary | ICD-10-CM | POA: Diagnosis not present

## 2023-06-01 MED ORDER — HYALURONAN 30 MG/2ML IX SOSY
30.0000 mg | PREFILLED_SYRINGE | Freq: Once | INTRA_ARTICULAR | Status: AC
Start: 2023-06-01 — End: 2023-06-01
  Administered 2023-06-01: 30 mg via INTRA_ARTICULAR

## 2023-06-01 NOTE — Progress Notes (Signed)
Joan Russo presents to clinic today for Orthovisc injection right knee 2/3 Procedure: Real-time Ultrasound Guided Injection of knee joint superior lateral patellar space Device: Philips Affiniti 50G Images permanently stored and available for review in PACS Verbal informed consent obtained.  Discussed risks and benefits of procedure. Warned about infection, bleeding, damage to structures among others. Patient expresses understanding and agreement Time-out conducted.   Noted no overlying erythema, induration, or other signs of local infection.   Skin prepped in a sterile fashion.   Local anesthesia: Topical Ethyl chloride.   With sterile technique and under real time ultrasound guidance: Orthovisc 30 mg injected into knee joint. Fluid seen entering the joint capsule.   Completed without difficulty   Advised to call if fevers/chills, erythema, induration, drainage, or persistent bleeding.   Images permanently stored and available for review in the ultrasound unit.  Impression: Technically successful ultrasound guided injection. Lot number: [id number]

## 2023-06-01 NOTE — Patient Instructions (Signed)
Thank you for coming in today.   You received an injection today. Seek immediate medical attention if the joint becomes red, extremely painful, or is oozing fluid.   We will see you next week for the 3rd Orthovisc injection and ALSO for a hip steroid injection

## 2023-06-08 ENCOUNTER — Encounter: Payer: Self-pay | Admitting: Family Medicine

## 2023-06-08 ENCOUNTER — Ambulatory Visit: Payer: Medicare HMO | Admitting: Family Medicine

## 2023-06-08 ENCOUNTER — Other Ambulatory Visit: Payer: Self-pay

## 2023-06-08 ENCOUNTER — Ambulatory Visit (INDEPENDENT_AMBULATORY_CARE_PROVIDER_SITE_OTHER): Payer: Medicare HMO

## 2023-06-08 VITALS — Ht <= 58 in | Wt 220.0 lb

## 2023-06-08 VITALS — BP 144/82 | HR 56 | Ht <= 58 in | Wt 234.8 lb

## 2023-06-08 DIAGNOSIS — M1711 Unilateral primary osteoarthritis, right knee: Secondary | ICD-10-CM | POA: Diagnosis not present

## 2023-06-08 DIAGNOSIS — G8929 Other chronic pain: Secondary | ICD-10-CM

## 2023-06-08 DIAGNOSIS — M7062 Trochanteric bursitis, left hip: Secondary | ICD-10-CM | POA: Diagnosis not present

## 2023-06-08 DIAGNOSIS — Z1231 Encounter for screening mammogram for malignant neoplasm of breast: Secondary | ICD-10-CM

## 2023-06-08 DIAGNOSIS — Z0000 Encounter for general adult medical examination without abnormal findings: Secondary | ICD-10-CM | POA: Diagnosis not present

## 2023-06-08 DIAGNOSIS — M25561 Pain in right knee: Secondary | ICD-10-CM

## 2023-06-08 MED ORDER — HYALURONAN 30 MG/2ML IX SOSY
30.0000 mg | PREFILLED_SYRINGE | Freq: Once | INTRA_ARTICULAR | Status: AC
Start: 2023-06-08 — End: 2023-06-08
  Administered 2023-06-08: 30 mg via INTRA_ARTICULAR

## 2023-06-08 NOTE — Progress Notes (Unsigned)
Joan Payor, PhD, LAT, ATC acting as a scribe for Joan Graham, MD.  Joan Russo is a 67 y.o. female who presents to [facility] at [facility] today for her 3rd Orthovisc injection, R knee, and f/u L hip pain. She was last seen by Dr. Denyse Amass on 03/12/23 for her L hip and was given a L GT steroid injection. Today, pt reports some improvement of knee sx with Orthovisc injections. Orthovisc #3 today.  Left hip sx have started to return over the past 3-4 weeks. Sx present at lateral aspect, worse with side-lying. Denies groin or gluteal pain. Denies sharp shooting pain into the leg.   Dx imaging: 11/26/20 L hip XR             07/30/20 R knee XR  Pertinent review of systems: No fevers or chills  Relevant historical information: Hypertension   Exam:  BP (!) 144/82   Pulse (!) 56   Ht 4\' 10"  (1.473 m)   Wt 234 lb 12.8 oz (106.5 kg)   SpO2 99%   BMI 49.07 kg/m  General: Well Developed, well nourished, and in no acute distress.   MSK: Left hip: Normal-appearing.  Tender palpation overlying greater trochanter. Decreased hip motion.  Pain with abduction.  Decreased strength abduction.  Right knee mild effusion otherwise normal-appearing Normal motion.  Lab and Radiology Results  [name] presents to clinic today for Orthovisc injection right knee 3/3 Procedure: Real-time Ultrasound Guided Injection of right knee joint superior lateral patellar space Device: Philips Affiniti 50G Images permanently stored and available for review in PACS Verbal informed consent obtained.  Discussed risks and benefits of procedure. Warned about infection, bleeding, damage to structures among others. Patient expresses understanding and agreement Time-out conducted.   Noted no overlying erythema, induration, or other signs of local infection.   Skin prepped in a sterile fashion.   Local anesthesia: Topical Ethyl chloride.   With sterile technique and under real time  ultrasound guidance: Orthovisc 30 injected into knee joint. Fluid seen entering the joint capsule.   Completed without difficulty   Advised to call if fevers/chills, erythema, induration, drainage, or persistent bleeding.   Images permanently stored and available for review in the ultrasound unit.  Impression: Technically successful ultrasound guided injection. Lot number: Orthovisc lot [id number]  Procedure: Real-time Ultrasound Guided Injection of left lateral hip greater trochanter bursa Device: Philips Affiniti 50G Images permanently stored and available for review in PACS Verbal informed consent obtained.  Discussed risks and benefits of procedure. Warned about infection, bleeding, hyperglycemia damage to structures among others. Patient expresses understanding and agreement Time-out conducted.   Noted no overlying erythema, induration, or other signs of local infection.   Skin prepped in a sterile fashion.   Local anesthesia: Topical Ethyl chloride.   With sterile technique and under real time ultrasound guidance: 40 mg of Kenalog and 2 mL of Marcaine injected into greater trochanter bursa. Fluid seen entering the bursa.   Completed without difficulty   Pain immediately resolved suggesting accurate placement of the medication.   Advised to call if fevers/chills, erythema, induration, drainage, or persistent bleeding.   Images permanently stored and available for review in the ultrasound unit.  Impression: Technically successful ultrasound guided injection.       Assessment and Plan: 67 y.o. female with left lateral hip pain due to greater trochanteric bursitis.  Plan for steroid injection today.  It has been 3 months since her last injection.  Continue  home exercise program.  Right knee Orthovisc series completed today.  If this does not work well enough she may be a good candidate for geniculate artery embolization as her BMI is greater than 40.   PDMP not reviewed this  encounter. Orders Placed This Encounter  Procedures   [place] LIMITED JOINT SPACE STRUCTURES LOW RIGHT(NO LINKED CHARGES)    Order Specific Question:   Reason for Exam (SYMPTOM  OR DIAGNOSIS REQUIRED)    Answer:   right knee OA    Order Specific Question:   Preferred imaging location?    Answer:   Adult nurse Sports Medicine[facility] ordered this encounter  Medications   Hyaluronan (ORTHOVISC) intra-articular injection 30 mg     Discussed warning signs or symptoms. Please see discharge instructions. Patient expresses understanding.   The above documentation has been reviewed and is accurate and complete Joan Russo, M.D.

## 2023-06-08 NOTE — Progress Notes (Signed)
I connected with  Dellia Beckwith on 06/08/23 by a audio enabled telemedicine application and verified that I am speaking with the correct person using two identifiers.  Patient Location: Home  Provider Location: Home Office  I discussed the limitations of evaluation and management by telemedicine. The patient expressed understanding and agreed to proceed.  Subjective:   Joan Russo is a 67 y.o. female who presents for Medicare Annual (Subsequent) preventive examination.  Review of Systems      Cardiac Risk Factors include: advanced age (>21men, >12 women);sedentary lifestyle;hypertension;obesity (BMI >30kg/m2)     Objective:    Today's Vitals   06/08/23 0949  Weight: 220 lb (99.8 kg)  Height: 4\' 10"  (1.473 m)  PainSc: 6    Body mass index is 45.98 kg/m.     06/08/2023   10:01 AM 06/05/2022    8:58 AM 06/04/2021    8:16 AM 05/14/2020    2:05 PM 03/27/2020   11:10 AM 11/29/2019    8:59 AM 06/06/2019    6:04 AM  Advanced Directives  Does Patient Have a Medical Advance Directive? Yes Yes Yes Yes No Yes No  Type of Estate agent of [place];Living will Healthcare Power of [place];Living will Healthcare Power of [place];Living will Healthcare Power of [place];Living will  Healthcare Power of Attorney   Does patient want to make changes to medical advance directive?  Yes (Inpatient - patient defers changing a medical advance directive and declines information at this time)    No - Patient declined No - Patient declined  Copy of Healthcare Power of Attorney in Chart? No - copy requested No - copy requested No - copy requested No - copy requested     Would patient like information on creating a medical advance directive?     No - Patient declined  No - Patient declined    Current Medications (verified) Outpatient Encounter Medications as of 06/08/2023  Medication Sig   ALPRAZolam (XANAX) 0.5 MG tablet TAKE 1 TABLET BY MOUTH AT BEDTIME AS NEEDED FOR  ANXIETY.   buPROPion (WELLBUTRIN XL) 300 MG 24 hr tablet Take 1 tablet (300 mg total) by mouth daily.   clobetasol cream (TEMOVATE) 0.05 % Apply 1 application topically 2 (two) times daily. X 6 wks, then drop to once daily x 6 wks, then 2x/wk   cyclobenzaprine (FLEXERIL) 5 MG tablet Take 1 tablet (5 mg total) by mouth at bedtime as needed for muscle spasms.   fluticasone (FLONASE) 50 MCG/ACT nasal spray Place 2 sprays into both nostrils daily.   ibuprofen (ADVIL) 600 MG tablet Take 1 tablet (600 mg total) by mouth every 6 (six) hours as needed.   levothyroxine (SYNTHROID) 50 MCG tablet TAKE 1 TABLET BY MOUTH EVERY DAY   Multiple Vitamin (MULTIVITAMIN WITH MINERALS) TABS tablet Take 1 tablet by mouth daily.   NON FORMULARY CBD GUMMIES -to help with knee pain   pregabalin (LYRICA) 75 MG capsule Take 1 capsule (75 mg total) by mouth 2 (two) times daily.   No facility-administered encounter medications on file as of 06/08/2023.    Allergies (verified) Patient has no known allergies.   History: Past Medical History:  Diagnosis Date   Anxiety    Anxiety and depression    Depression    Diverticulosis    GERD (gastroesophageal reflux disease)    Internal hemorrhoids    OA (osteoarthritis)    Obesity, Class III, BMI 40-49.9 (morbid obesity) (HCC)    Osteopenia    Thyroid  disease    Tubular adenoma    Urinary incontinence    Vitamin D deficiency    Past Surgical History:  Procedure Laterality Date   BREAST BIOPSY Right    COLONOSCOPY WITH PROPOFOL N/A 07/07/2016   Procedure: COLONOSCOPY WITH PROPOFOL;  Surgeon: Beverley Fiedler, MD;  Location: [facility];  Service: Gastroenterology;  Laterality: N/A;   ESOPHAGOGASTRODUODENOSCOPY (EGD) WITH PROPOFOL N/A 07/07/2016   Procedure: ESOPHAGOGASTRODUODENOSCOPY (EGD) WITH PROPOFOL;  Surgeon: Beverley Fiedler, MD;  Location: [facility];  Service: Gastroenterology;  Laterality: N/A;   HYSTEROSCOPY WITH D & C N/A 01/16/2015   Procedure: DILATATION AND  CURETTAGE /HYSTEROSCOPY;  Surgeon: Reva Bores, MD;  Location: [facility];  Service: Gynecology;  Laterality: N/A;   HYSTEROSCOPY WITH D & C N/A 06/06/2019   Procedure: DILATATION AND CURETTAGE /HYSTEROSCOPY;  Surgeon: Natale Milch, MD;  Location: [facility] ORS;  Service: Gynecology;  Laterality: N/A;   JOINT REPLACEMENT     TOTAL KNEE ARTHROPLASTY  08/2008   Left   Family History  Problem Relation Age of Onset   Cancer Mother 80       breast   Breast cancer Mother 19   Cancer Father    Cancer Daughter 74       breast   Breast cancer Daughter 66   Colon cancer Neg Hx    Esophageal cancer Neg Hx    Rectal cancer Neg Hx    Stomach cancer Neg Hx    Social History   Socioeconomic History   Marital status: Widowed    Spouse name: Not on file   Number of children: 4   Years of education: Not on file   Highest education level: Not on file  Occupational History   Occupation: Science writer  Tobacco Use   Smoking status: Never   Smokeless tobacco: Never  Vaping Use   Vaping Use: Never used  Substance and Sexual Activity   Alcohol use: No    Alcohol/week: 0.0 standard drinks of alcohol   Drug use: No   Sexual activity: Not Currently    Birth control/protection: Post-menopausal  Other Topics Concern   Not on file  Social History Narrative   Not on file   Social Determinants of Health   Financial Resource Strain: Low Risk  (06/08/2023)   Overall Financial Resource Strain (CARDIA)    Difficulty of Paying Living Expenses: Not hard at all  Food Insecurity: No Food Insecurity (06/08/2023)   Hunger Vital Sign    Worried About Running Out of Food in the Last Year: Never true    Ran Out of Food in the Last Year: Never true  Transportation Needs: No Transportation Needs (06/08/2023)   PRAPARE - Administrator, Civil Service (Medical): No    Lack of Transportation (Non-Medical): No  Physical Activity: Inactive (06/08/2023)   Exercise Vital Sign    Days of Exercise  per Week: 0 days    Minutes of Exercise per Session: 0 min  Stress: No Stress Concern Present (06/08/2023)   [employer] of Occupational Health - Occupational Stress Questionnaire    Feeling of Stress : Not at all  Social Connections: Socially Isolated (06/08/2023)   Social Connection and Isolation Panel [NHANES]    Frequency of Communication with Friends and Family: More than three times a week    Frequency of Social Gatherings with Friends and Family: More than three times a week    Attends Religious Services: Never    Active Member  of Clubs or Organizations: No    Attends Banker Meetings: Never    Marital Status: Widowed    Tobacco Counseling Counseling given: Not Answered   Clinical Intake:  Pre-visit preparation completed: Yes  Pain : 0-10 Pain Score: 6  Pain Type: Chronic pain Pain Location: Knee Pain Orientation: Right Pain Descriptors / Indicators: Sharp, Aching (under ortho care)     Nutritional Risks: None Diabetes: No  How often do you need to have someone help you when you read instructions, pamphlets, or other written materials from your doctor or pharmacy?: 1 - Never  Diabetic? no  Interpreter Needed?: No  Information entered by :: C.Karla Pavone LPN   Activities of Daily Living    06/08/2023   10:02 AM  In your present state of health, do you have any difficulty performing the following activities:  Hearing? 0  Vision? 0  Difficulty concentrating or making decisions? 0  Walking or climbing stairs? 0  Dressing or bathing? 0  Doing errands, shopping? 0  Preparing Food and eating ? N  Using the Toilet? N  In the past six months, have you accidently leaked urine? N  Do you have problems with loss of bowel control? N  Managing your Medications? N  Managing your Finances? N  Housekeeping or managing your Housekeeping? N    Patient Care Team: Eden Emms, NP as PCP - General  Indicate any recent Medical Services you may have  received from other than [facility] providers in the past year (date may be approximate).     Assessment:   This is a routine wellness examination for [place].  Hearing/Vision screen Hearing Screening - Comments:: No hearing issues Vision Screening - Comments:: Glasses - Opthalmology  Dietary issues and exercise activities discussed: Current Exercise Habits: The patient does not participate in regular exercise at present, Exercise limited by: orthopedic condition(s) (knee pain)   Goals Addressed             This Visit's Progress    Patient Stated       Lose 40 pounds.       Depression Screen    06/08/2023   10:00 AM 03/30/2023   12:29 PM 09/30/2022    3:46 PM 06/05/2022    8:56 AM 02/19/2022    2:20 PM 09/17/2021   12:27 PM 06/04/2021    8:17 AM  PHQ 2/9 Scores  PHQ - 2 Score 0 4 2 0 0 6 0  PHQ- 9 Score  8 10 2 2 21  0    Fall Risk    06/08/2023   10:02 AM 03/30/2023   12:29 PM 06/05/2022    8:59 AM 06/04/2021    8:15 AM 05/14/2020    2:05 PM  Fall Risk   Falls in the past year? 0 0 0 1 1  Comment     dizzy spells  Number falls in past yr: 0 0 0 1 1  Injury with Fall? 0 0 0 0 0  Risk for fall due to : No Fall Risks No Fall Risks No Fall Risks Medication side effect;Impaired balance/gait Impaired balance/gait;Medication side effect  Follow up Falls prevention discussed;Falls evaluation completed Falls evaluation completed Falls evaluation completed Falls evaluation completed;Falls prevention discussed Falls evaluation completed;Falls prevention discussed    FALL RISK PREVENTION PERTAINING TO THE HOME:  Any stairs in or around the home? Yes  If so, are there any without handrails? Yes  Home free of loose throw rugs in walkways, pet beds,  electrical cords, etc? Yes  Adequate lighting in your home to reduce risk of falls? Yes   ASSISTIVE DEVICES UTILIZED TO PREVENT FALLS:  Life alert? No  Use of a cane, walker or w/c? Yes  Grab bars in the bathroom? No  Shower chair or bench  in shower? Yes  Elevated toilet seat or a handicapped toilet? No    Cognitive Function:    06/04/2021    8:22 AM 05/14/2020    2:09 PM  MMSE - Mini Mental State Exam  Orientation to time 5 5  Orientation to Place 5 5  Registration 3 3  Attention/ Calculation 5 5  Recall 3 3  Language- repeat 1 1        06/08/2023   10:03 AM 06/05/2022    9:01 AM  6CIT Screen  What Year? 0 points   What month? 0 points 0 points  What time? 0 points 0 points  Count back from 20 0 points 0 points  Months in reverse 0 points 0 points  Repeat phrase 0 points 0 points  Total Score 0 points     Immunizations Immunization History  Administered Date(s) Administered   Fluad Quad(high Dose 65+) 08/29/2019, 09/05/2020, 09/17/2021, 09/30/2022   Influenza Split 10/13/2011   Influenza,inj,Quad PF,6+ Mos 09/19/2014, 10/14/2015, 08/28/2017, 10/17/2018   PFIZER(Purple Top)SARS-COV-2 Vaccination 09/29/2020, 12/09/2020   PNEUMOCOCCAL CONJUGATE-20 09/30/2022    TDAP status: Due, Education has been provided regarding the importance of this vaccine. Advised may receive this vaccine at local pharmacy or Health Dept. Aware to provide a copy of the vaccination record if obtained from local pharmacy or Health Dept. Verbalized acceptance and understanding.  Flu Vaccine status: Up to date  Pneumococcal vaccine status: Up to date  Covid-19 vaccine status: Information provided on how to obtain vaccines.   Qualifies for Shingles Vaccine? Yes   Zostavax completed No   Shingrix Completed?: No.    Education has been provided regarding the importance of this vaccine. Patient has been advised to call insurance company to determine out of pocket expense if they have not yet received this vaccine. Advised may also receive vaccine at local pharmacy or Health Dept. Verbalized acceptance and understanding.  Screening Tests Health Maintenance  Topic Date Due   Hepatitis C Screening  Never done   DTaP/Tdap/Td (1 - Tdap)  Never done   Zoster Vaccines- Shingrix (1 of 2) Never done   DEXA SCAN  04/08/2019   COVID-19 Vaccine (3 - 2023-24 season) 08/28/2022   INFLUENZA VACCINE  07/29/2023   MAMMOGRAM  01/31/2024   Medicare Annual Wellness (AWV)  06/07/2024   Colonoscopy  07/07/2026   Pneumonia Vaccine 40+ Years old  Completed   HPV VACCINES  Aged Out    Health Maintenance  Health Maintenance Due  Topic Date Due   Hepatitis C Screening  Never done   DTaP/Tdap/Td (1 - Tdap) Never done   Zoster Vaccines- Shingrix (1 of 2) Never done   DEXA SCAN  04/08/2019   COVID-19 Vaccine (3 - 2023-24 season) 08/28/2022    Colorectal cancer screening: Type of screening: Colonoscopy. Completed 07/07/2016. Repeat every 10 years  Mammogram status: Completed 01/30/2022. Repeat every year order placed  Bone Density status: Completed 12/19/08. Results reflect: Bone density results: NORMAL. Repeat every 5 years.  Order placed  Lung Cancer Screening: (Low Dose CT Chest recommended if Age 38-80 years, 30 pack-year currently smoking OR have quit w/in 15years.) does not qualify.   Lung Cancer Screening Referral: no  Additional  Screening:  Hepatitis C Screening: does qualify; Completed DUE AT NEXT VISIT  Vision Screening: Recommended annual ophthalmology exams for early detection of glaucoma and other disorders of the eye. Is the patient up to date with their annual eye exam?  Yes  Who is the provider or what is the name of the office in which the patient attends annual eye exams? [facility] If pt is not established with a provider, would they like to be referred to a provider to establish care? Yes .   Dental Screening: Recommended annual dental exams for proper oral hygiene  Community Resource Referral / Chronic Care Management: CRR required this visit?  No   CCM required this visit?  No      Plan:     I have personally reviewed and noted the following in the patient's chart:   Medical and social  history Use of alcohol, tobacco or illicit drugs  Current medications and supplements including opioid prescriptions. Patient is not currently taking opioid prescriptions. Functional ability and status Nutritional status Physical activity Advanced directives List of other physicians Hospitalizations, surgeries, and ER visits in previous 12 months Vitals Screenings to include cognitive, depression, and falls Referrals and appointments  In addition, I have reviewed and discussed with patient certain preventive protocols, quality metrics, and best practice recommendations. A written personalized care plan for preventive services as well as general preventive health recommendations were provided to patient.     Maryan Puls, LPN   [id number]   Nurse Notes: Mammogram order placed.

## 2023-06-08 NOTE — Patient Instructions (Signed)
Thank you for coming in today.   You received an injection today. Seek immediate medical attention if the joint becomes red, extremely painful, or is oozing fluid.   Check back as needed 

## 2023-06-08 NOTE — Patient Instructions (Signed)
Joan Russo , Thank you for taking time to come for your Medicare Wellness Visit. I appreciate your ongoing commitment to your health goals. Please review the following plan we discussed and let me know if I can assist you in the future.   These are the goals we discussed:  Goals      DIET - EAT MORE FRUITS AND VEGETABLES     Patient Stated     05/14/2020, I will start back going to the [facility] and exercise at least 3 days a week.      Patient Stated     06/04/2021, I will maintain and continue medications as prescribed.      Patient Stated     Lose 40 pounds.        This is a list of the screening recommended for you and due dates:  Health Maintenance  Topic Date Due   Hepatitis C Screening  Never done   DTaP/Tdap/Td vaccine (1 - Tdap) Never done   Zoster (Shingles) Vaccine (1 of 2) Never done   DEXA scan (bone density measurement)  04/08/2019   COVID-19 Vaccine (3 - 2023-24 season) 08/28/2022   Flu Shot  07/29/2023   Mammogram  01/31/2024   Medicare Annual Wellness Visit  06/07/2024   Colon Cancer Screening  07/07/2026   Pneumonia Vaccine  Completed   HPV Vaccine  Aged Out    Advanced directives: Please bring a copy of your health care power of attorney and living will to the office to be added to your chart at your convenience.   Conditions/risks identified: Aim for 30 minutes of exercise or brisk walking, 6-8 glasses of water, and 5 servings of fruits and vegetables each day.   Next appointment: Follow up in one year for your annual wellness visit 06/08/24 @ 9:45 televisit   Preventive Care 65 Years and Older, Female Preventive care refers to lifestyle choices and visits with your health care provider that can promote health and wellness. What does preventive care include? A yearly physical exam. This is also called an annual well check. Dental exams once or twice a year. Routine eye exams. Ask your health care provider how often you should have your eyes  checked. Personal lifestyle choices, including: Daily care of your teeth and gums. Regular physical activity. Eating a healthy diet. Avoiding tobacco and drug use. Limiting alcohol use. Practicing safe sex. Taking low-dose aspirin every day. Taking vitamin and mineral supplements as recommended by your health care provider. What happens during an annual well check? The services and screenings done by your health care provider during your annual well check will depend on your age, overall health, lifestyle risk factors, and family history of disease. Counseling  Your health care provider may ask you questions about your: Alcohol use. Tobacco use. Drug use. Emotional well-being. Home and relationship well-being. Sexual activity. Eating habits. History of falls. Memory and ability to understand (cognition). Work and work Astronomer. Reproductive health. Screening  You may have the following tests or measurements: Height, weight, and BMI. Blood pressure. Lipid and cholesterol levels. These may be checked every 5 years, or more frequently if you are over 73 years old. Skin check. Lung cancer screening. You may have this screening every year starting at age 40 if you have a 30-pack-year history of smoking and currently smoke or have quit within the past 15 years. Fecal occult blood test (FOBT) of the stool. You may have this test every year starting at age 24. Flexible sigmoidoscopy  or colonoscopy. You may have a sigmoidoscopy every 5 years or a colonoscopy every 10 years starting at age 54. Hepatitis C blood test. Hepatitis B blood test. Sexually transmitted disease (STD) testing. Diabetes screening. This is done by checking your blood sugar (glucose) after you have not eaten for a while (fasting). You may have this done every 1-3 years. Bone density scan. This is done to screen for osteoporosis. You may have this done starting at age 73. Mammogram. This may be done every 1-2  years. Talk to your health care provider about how often you should have regular mammograms. Talk with your health care provider about your test results, treatment options, and if necessary, the need for more tests. Vaccines  Your health care provider may recommend certain vaccines, such as: Influenza vaccine. This is recommended every year. Tetanus, diphtheria, and acellular pertussis (Tdap, Td) vaccine. You may need a Td booster every 10 years. Zoster vaccine. You may need this after age 18. Pneumococcal 13-valent conjugate (PCV13) vaccine. One dose is recommended after age 63. Pneumococcal polysaccharide (PPSV23) vaccine. One dose is recommended after age 19. Talk to your health care provider about which screenings and vaccines you need and how often you need them. This information is not intended to replace advice given to you by your health care provider. Make sure you discuss any questions you have with your health care provider. Document Released: 01/10/2016 Document Revised: 09/02/2016 Document Reviewed: 10/15/2015 Elsevier Interactive Patient Education  2017 [place] Prevention in the Home Falls can cause injuries. They can happen to people of all ages. There are many things you can do to make your home safe and to help prevent falls. What can I do on the outside of my home? Regularly fix the edges of walkways and driveways and fix any cracks. Remove anything that might make you trip as you walk through a door, such as a raised step or threshold. Trim any bushes or trees on the path to your home. Use bright outdoor lighting. Clear any walking paths of anything that might make someone trip, such as rocks or tools. Regularly check to see if handrails are loose or broken. Make sure that both sides of any steps have handrails. Any raised decks and porches should have guardrails on the edges. Have any leaves, snow, or ice cleared regularly. Use sand or salt on walking paths  during winter. Clean up any spills in your garage right away. This includes oil or grease spills. What can I do in the bathroom? Use night lights. Install grab bars by the toilet and in the tub and shower. Do not use towel bars as grab bars. Use non-skid mats or decals in the tub or shower. If you need to sit down in the shower, use a plastic, non-slip stool. Keep the floor dry. Clean up any water that spills on the floor as soon as it happens. Remove soap buildup in the tub or shower regularly. Attach bath mats securely with double-sided non-slip rug tape. Do not have throw rugs and other things on the floor that can make you trip. What can I do in the bedroom? Use night lights. Make sure that you have a light by your bed that is easy to reach. Do not use any sheets or blankets that are too big for your bed. They should not hang down onto the floor. Have a firm chair that has side arms. You can use this for support while you get dressed. Do  not have throw rugs and other things on the floor that can make you trip. What can I do in the kitchen? Clean up any spills right away. Avoid walking on wet floors. Keep items that you use a lot in easy-to-reach places. If you need to reach something above you, use a strong step stool that has a grab bar. Keep electrical cords out of the way. Do not use floor polish or wax that makes floors slippery. If you must use wax, use non-skid floor wax. Do not have throw rugs and other things on the floor that can make you trip. What can I do with my stairs? Do not leave any items on the stairs. Make sure that there are handrails on both sides of the stairs and use them. Fix handrails that are broken or loose. Make sure that handrails are as long as the stairways. Check any carpeting to make sure that it is firmly attached to the stairs. Fix any carpet that is loose or worn. Avoid having throw rugs at the top or bottom of the stairs. If you do have throw  rugs, attach them to the floor with carpet tape. Make sure that you have a light switch at the top of the stairs and the bottom of the stairs. If you do not have them, ask someone to add them for you. What else can I do to help prevent falls? Wear shoes that: Do not have high heels. Have rubber bottoms. Are comfortable and fit you well. Are closed at the toe. Do not wear sandals. If you use a stepladder: Make sure that it is fully opened. Do not climb a closed stepladder. Make sure that both sides of the stepladder are locked into place. Ask someone to hold it for you, if possible. Clearly mark and make sure that you can see: Any grab bars or handrails. First and last steps. Where the edge of each step is. Use tools that help you move around (mobility aids) if they are needed. These include: Canes. Walkers. Scooters. Crutches. Turn on the lights when you go into a dark area. Replace any light bulbs as soon as they burn out. Set up your furniture so you have a clear path. Avoid moving your furniture around. If any of your floors are uneven, fix them. If there are any pets around you, be aware of where they are. Review your medicines with your doctor. Some medicines can make you feel dizzy. This can increase your chance of falling. Ask your doctor what other things that you can do to help prevent falls. This information is not intended to replace advice given to you by your health care provider. Make sure you discuss any questions you have with your health care provider. Document Released: 10/10/2009 Document Revised: 05/21/2016 Document Reviewed: 01/18/2015 Elsevier Interactive Patient Education  2017 [employer].

## 2023-06-11 ENCOUNTER — Ambulatory Visit: Payer: Medicare HMO | Admitting: Family Medicine

## 2023-06-11 NOTE — Telephone Encounter (Signed)
Pt completed Orthovisc series for RIGHT knee OA on 06/08/23.  Can consider repeat on or after 12/09/23

## 2023-06-14 NOTE — Telephone Encounter (Signed)
VOB initiated for Orthovisc for RIGHT knee OA.  

## 2023-06-15 NOTE — Telephone Encounter (Deleted)
Primary Insurance: [employer] Adv

## 2023-07-04 ENCOUNTER — Other Ambulatory Visit: Payer: Self-pay | Admitting: Nurse Practitioner

## 2023-07-04 DIAGNOSIS — Z760 Encounter for issue of repeat prescription: Secondary | ICD-10-CM

## 2023-07-05 ENCOUNTER — Other Ambulatory Visit: Payer: Self-pay | Admitting: Nurse Practitioner

## 2023-07-05 DIAGNOSIS — M797 Fibromyalgia: Secondary | ICD-10-CM

## 2023-07-05 NOTE — Telephone Encounter (Signed)
LAST APPOINTMENT DATE: 04/21/23 for acute issues seen with sports med    NEXT APPOINTMENT DATE: Visit date not found    LAST REFILL: 05/03/23  QTY: #60 1 rf

## 2023-07-29 ENCOUNTER — Ambulatory Visit: Payer: Medicare HMO | Admitting: Family Medicine

## 2023-07-29 ENCOUNTER — Other Ambulatory Visit: Payer: Self-pay

## 2023-07-29 ENCOUNTER — Telehealth: Payer: Self-pay | Admitting: Family Medicine

## 2023-07-29 DIAGNOSIS — M1711 Unilateral primary osteoarthritis, right knee: Secondary | ICD-10-CM

## 2023-07-29 NOTE — Progress Notes (Deleted)
   Rubin Payor, PhD, LAT, ATC acting as a scribe for [name], MD.  BRETTE PICCINI is a 67 y.o. female who presents to [facility] at [facility] today for worsening R knee pain. Pt was last seen by Dr. Denyse Amass on 06/08/23 and completed the Orthovisc series, 3/3, in her R knee. Last R knee steroid injection was on 03/12/23.  Today, pt reports ***  Dx imaging: 07/30/20 R knee XR   Pertinent review of systems: ***  Relevant historical information: ***   Exam:  There were no vitals taken for this visit. General: Well Developed, well nourished, and in no acute distress.   MSK: ***    Lab and Radiology Results No results found for this or any previous visit (from the past 72 hour(s)). No results found.     Assessment and Plan: 67 y.o. female with ***   PDMP not reviewed this encounter. No orders of the defined types were placed in this encounter.  No orders of the defined types were placed in this encounter.    Discussed warning signs or symptoms. Please see discharge instructions. Patient expresses understanding.   ***

## 2023-07-29 NOTE — Telephone Encounter (Signed)
Referral placed.

## 2023-07-29 NOTE — Telephone Encounter (Signed)
Pt had to cancel her appt with [place] today as her knee hurts so bad she cannot drive. Pt requested we refer her to Dr. Turner Daniels at [facility] as she thinks it is time for a knee replacement.

## 2023-08-10 DIAGNOSIS — M1711 Unilateral primary osteoarthritis, right knee: Secondary | ICD-10-CM | POA: Diagnosis not present

## 2023-08-10 DIAGNOSIS — M170 Bilateral primary osteoarthritis of knee: Secondary | ICD-10-CM | POA: Diagnosis not present

## 2023-08-10 DIAGNOSIS — M1712 Unilateral primary osteoarthritis, left knee: Secondary | ICD-10-CM | POA: Diagnosis not present

## 2023-08-11 DIAGNOSIS — Z961 Presence of intraocular lens: Secondary | ICD-10-CM | POA: Diagnosis not present

## 2023-08-11 DIAGNOSIS — H18603 Keratoconus, unspecified, bilateral: Secondary | ICD-10-CM | POA: Diagnosis not present

## 2023-09-14 ENCOUNTER — Other Ambulatory Visit: Payer: Self-pay | Admitting: Nurse Practitioner

## 2023-09-14 DIAGNOSIS — M797 Fibromyalgia: Secondary | ICD-10-CM

## 2023-09-14 NOTE — Telephone Encounter (Signed)
LAST APPOINTMENT DATE: 03/30/23   NEXT APPOINTMENT DATE: 06/08/2024  Pregabalin 75mg    LAST REFILL: 07/05/2023  QTY: #60 1RF

## 2023-10-04 ENCOUNTER — Other Ambulatory Visit: Payer: Self-pay | Admitting: Nurse Practitioner

## 2023-10-04 DIAGNOSIS — R0982 Postnasal drip: Secondary | ICD-10-CM

## 2023-10-21 ENCOUNTER — Ambulatory Visit: Payer: Medicare HMO | Admitting: Nurse Practitioner

## 2023-10-21 VITALS — BP 126/82 | HR 75 | Temp 98.2°F | Ht <= 58 in | Wt 284.0 lb

## 2023-10-21 DIAGNOSIS — E559 Vitamin D deficiency, unspecified: Secondary | ICD-10-CM

## 2023-10-21 DIAGNOSIS — F419 Anxiety disorder, unspecified: Secondary | ICD-10-CM

## 2023-10-21 DIAGNOSIS — M8580 Other specified disorders of bone density and structure, unspecified site: Secondary | ICD-10-CM

## 2023-10-21 DIAGNOSIS — I10 Essential (primary) hypertension: Secondary | ICD-10-CM | POA: Diagnosis not present

## 2023-10-21 DIAGNOSIS — E039 Hypothyroidism, unspecified: Secondary | ICD-10-CM | POA: Diagnosis not present

## 2023-10-21 DIAGNOSIS — M797 Fibromyalgia: Secondary | ICD-10-CM

## 2023-10-21 DIAGNOSIS — F32A Depression, unspecified: Secondary | ICD-10-CM | POA: Diagnosis not present

## 2023-10-21 DIAGNOSIS — L989 Disorder of the skin and subcutaneous tissue, unspecified: Secondary | ICD-10-CM | POA: Diagnosis not present

## 2023-10-21 DIAGNOSIS — E785 Hyperlipidemia, unspecified: Secondary | ICD-10-CM

## 2023-10-21 MED ORDER — ALPRAZOLAM 0.5 MG PO TABS
ORAL_TABLET | ORAL | 0 refills | Status: DC
Start: 2023-10-21 — End: 2024-07-11

## 2023-10-21 MED ORDER — PREGABALIN 75 MG PO CAPS
75.0000 mg | ORAL_CAPSULE | Freq: Two times a day (BID) | ORAL | 2 refills | Status: DC
Start: 2023-10-21 — End: 2024-02-23

## 2023-10-21 MED ORDER — HYDROCORTISONE 2.5 % EX CREA
TOPICAL_CREAM | Freq: Two times a day (BID) | CUTANEOUS | 0 refills | Status: DC
Start: 2023-10-21 — End: 2024-07-11

## 2023-10-21 NOTE — Patient Instructions (Signed)
Nice to see you today Make a fasting lab visit over the next 2 weeks Make a physical appointment in the next 2 months Use the cream twice a day for 7 days Use a thick coat of vasoline at bedtime

## 2023-10-21 NOTE — Assessment & Plan Note (Signed)
Will check TSH in setting of skin abnormality

## 2023-10-21 NOTE — Progress Notes (Signed)
Acute Office Visit  Subjective:     Patient ID: Joan Russo, female    DOB: 12/09/1956, 67 y.o.   MRN: [id number]  Chief Complaint  Patient presents with   dry skin, forehead peeling    Pt complains of dry skin on forehead. States it began peeling a couple months ago. Pt states no pain or itchiness. Pt has used hydrocortisone and Vaseline, and OTC creams/oils but dry skin/peeling comes back.    Medication Refill    Alprazolam and pregabalin      Patient is in today for dry skin with a history of thyroid disease, vitamin D deficiency, anxiety, and GERD   States that she has been dealing with it for several months. She stopped the face products. States that she has used hydrocortisone and vasoline. States that she feels like it does not go away at all but has been present. States that she does have sensitive skin facila clenser and lotion but that has not changed.  Does not itch or burn. States that the only way she knows it si threr because it is shiny and feels different. States that it will get red  and flake dry skin.  Review of Systems  Constitutional:  Negative for chills and fever.  Respiratory:  Negative for shortness of breath.   Cardiovascular:  Negative for chest pain.  Neurological:  Negative for headaches.        Objective:    BP 126/82   Pulse 75   Temp 98.2 F (36.8 C) (Oral)   Ht 4\' 10"  (1.473 m)   Wt 284 lb (128.8 kg)   SpO2 95%   BMI 59.36 kg/m    Physical Exam Vitals and nursing note reviewed.  Constitutional:      Appearance: Normal appearance.  Cardiovascular:     Rate and Rhythm: Normal rate and regular rhythm.     Heart sounds: Normal heart sounds.  Pulmonary:     Effort: Pulmonary effort is normal.     Breath sounds: Normal breath sounds.  Skin:    Findings: Lesion present.       Neurological:     Mental Status: She is alert.     No results found for any visits on 10/21/23.      Assessment & Plan:   Problem List  Items Addressed This Visit       Cardiovascular and Mediastinum   HTN (hypertension)   Relevant Orders   CBC   Comprehensive metabolic panel   Hemoglobin A1c     Endocrine   Hypothyroidism    Will check TSH in setting of skin abnormality      Relevant Orders   TSH     Musculoskeletal and Integument   Skin lesion - Primary    Will try hydrocortisone 2.5% twice daily for 7 days.  Topical steroid precautions reviewed.  If no resolution patient will need to see dermatology as this could be AK      Relevant Medications   hydrocortisone 2.5 % cream   Osteopenia     Other   Vitamin D deficiency   Relevant Orders   VITAMIN D 25 Hydroxy (Vit-D Deficiency, Fractures)   Fibromyalgia   Relevant Medications   pregabalin (LYRICA) 75 MG capsule   Other Visit Diagnoses     Anxiety and depression       Relevant Medications   ALPRAZolam (XANAX) 0.5 MG tablet   Hyperlipidemia, unspecified hyperlipidemia type       Relevant Orders  Hemoglobin A1c   Lipid panel   Morbid obesity (HCC)       Relevant Orders   Hemoglobin A1c       Meds ordered this encounter  Medications   ALPRAZolam (XANAX) 0.5 MG tablet    Sig: TAKE 1 TABLET BY MOUTH AT BEDTIME AS NEEDED FOR ANXIETY.    Dispense:  20 tablet    Refill:  0    Not to exceed 5 additional fills before 11/24/2022 DX Code Needed  .    Order Specific Question:   Supervising Provider    Answer:   Roxy Manns A [[id number]]   pregabalin (LYRICA) 75 MG capsule    Sig: Take 1 capsule (75 mg total) by mouth 2 (two) times daily.    Dispense:  60 capsule    Refill:  2    Not to exceed 4 additional fills before 01/01/2024 DX Code Needed  .    Order Specific Question:   Supervising Provider    Answer:   Roxy Manns A [[id number]]   hydrocortisone 2.5 % cream    Sig: Apply topically 2 (two) times daily.    Dispense:  30 g    Refill:  0    Order Specific Question:   Supervising Provider    Answer:   Roxy Manns A [[id number]]    Return in about 2  months (around 12/21/2023) for cpe.  Audria Nine, NP

## 2023-10-21 NOTE — Assessment & Plan Note (Signed)
Will try hydrocortisone 2.5% twice daily for 7 days.  Topical steroid precautions reviewed.  If no resolution patient will need to see dermatology as this could be AK

## 2023-10-25 ENCOUNTER — Ambulatory Visit: Payer: Medicare HMO | Admitting: Family Medicine

## 2023-10-25 ENCOUNTER — Other Ambulatory Visit: Payer: Self-pay

## 2023-10-25 ENCOUNTER — Other Ambulatory Visit (INDEPENDENT_AMBULATORY_CARE_PROVIDER_SITE_OTHER): Payer: Medicare HMO

## 2023-10-25 ENCOUNTER — Encounter: Payer: Self-pay | Admitting: Family Medicine

## 2023-10-25 VITALS — BP 140/84 | HR 84 | Ht <= 58 in | Wt 291.0 lb

## 2023-10-25 DIAGNOSIS — M25552 Pain in left hip: Secondary | ICD-10-CM

## 2023-10-25 DIAGNOSIS — G8929 Other chronic pain: Secondary | ICD-10-CM

## 2023-10-25 DIAGNOSIS — E785 Hyperlipidemia, unspecified: Secondary | ICD-10-CM | POA: Diagnosis not present

## 2023-10-25 DIAGNOSIS — M1711 Unilateral primary osteoarthritis, right knee: Secondary | ICD-10-CM

## 2023-10-25 DIAGNOSIS — E559 Vitamin D deficiency, unspecified: Secondary | ICD-10-CM | POA: Diagnosis not present

## 2023-10-25 DIAGNOSIS — E039 Hypothyroidism, unspecified: Secondary | ICD-10-CM

## 2023-10-25 DIAGNOSIS — M25561 Pain in right knee: Secondary | ICD-10-CM

## 2023-10-25 DIAGNOSIS — I10 Essential (primary) hypertension: Secondary | ICD-10-CM | POA: Diagnosis not present

## 2023-10-25 LAB — LIPID PANEL
Cholesterol: 221 mg/dL — ABNORMAL HIGH (ref 0–200)
HDL: 66.6 mg/dL (ref >39.00)
LDL Cholesterol: 124 mg/dL — ABNORMAL HIGH (ref 0–99)
NonHDL: 154.09
Total CHOL/HDL Ratio: 3
Triglycerides: 150 mg/dL — ABNORMAL HIGH (ref 0.0–149.0)
VLDL: 30 mg/dL (ref 0.0–40.0)

## 2023-10-25 LAB — COMPREHENSIVE METABOLIC PANEL
ALT: 13 U/L (ref 0–35)
AST: 14 U/L (ref 0–37)
Albumin: 4 g/dL (ref 3.5–5.2)
Alkaline Phosphatase: 81 U/L (ref 39–117)
BUN: 20 mg/dL (ref 6–23)
CO2: 26 meq/L (ref 19–32)
Calcium: 9.3 mg/dL (ref 8.4–10.5)
Chloride: 107 meq/L (ref 96–112)
Creatinine, Ser: 1.02 mg/dL (ref 0.40–1.20)
GFR: 56.12 mL/min — ABNORMAL LOW (ref >60.00)
Glucose, Bld: 96 mg/dL (ref 70–99)
Potassium: 4.1 meq/L (ref 3.5–5.1)
Sodium: 143 meq/L (ref 135–145)
Total Bilirubin: 0.5 mg/dL (ref 0.2–1.2)
Total Protein: 6.5 g/dL (ref 6.0–8.3)

## 2023-10-25 LAB — CBC
HCT: 43.1 % (ref 36.0–46.0)
Hemoglobin: 13.8 g/dL (ref 12.0–15.0)
MCHC: 32 g/dL (ref 30.0–36.0)
MCV: 90.6 fL (ref 78.0–100.0)
Platelets: 270 10*3/uL (ref 150.0–400.0)
RBC: 4.75 Mil/uL (ref 3.87–5.11)
RDW: 14.6 % (ref 11.5–15.5)
WBC: 9.8 10*3/uL (ref 4.0–10.5)

## 2023-10-25 LAB — TSH: TSH: 0.94 u[IU]/mL (ref 0.35–5.50)

## 2023-10-25 LAB — VITAMIN D 25 HYDROXY (VIT D DEFICIENCY, FRACTURES): VITD: 29.84 ng/mL — ABNORMAL LOW (ref 30.00–100.00)

## 2023-10-25 LAB — HEMOGLOBIN A1C: Hgb A1c MFr Bld: 5.2 % (ref 4.6–6.5)

## 2023-10-25 MED ORDER — METHYLPREDNISOLONE ACETATE 40 MG/ML IJ SUSP
40.0000 mg | Freq: Once | INTRAMUSCULAR | Status: AC
Start: 2023-10-25 — End: 2023-10-25
  Administered 2023-10-25: 40 mg via INTRA_ARTICULAR

## 2023-10-25 NOTE — Telephone Encounter (Signed)
VOB initiated for Zilretta for RIGHT knee OA.  

## 2023-10-25 NOTE — Telephone Encounter (Signed)
Pt has now tried Triamcinolone and Methylprednisolone for knee OA.   Will need to check benefits for Zilretta again now that pt has satisfied clinical criteria.

## 2023-10-25 NOTE — Patient Instructions (Signed)
Thank you for coming in today.   You received an injection today. Seek immediate medical attention if the joint becomes red, extremely painful, or is oozing fluid.  

## 2023-10-25 NOTE — Progress Notes (Signed)
I, Stevenson Clinch, CMA acting as a scribe for [name], MD.  Joan Russo is a 67 y.o. female who presents to [facility] at [facility] today for exacerbation of her R knee pain. Pt was last seen by Dr. Denyse Amass on 06/08/23 and completed the Orthovisc series, 3/3, in her R knee.  Today, pt reports no improvement of sx with Orthovisc. Sx have waxed and wane, has been severe at times. Denies visible swelling. Feels unstable at times. Has taken IBU with inadequate relief. Difficulty with sit-to-stand. Insurance requires trial and inadequate response or intolerance to TWO standard intra-articular steroid injections before considering coverage of Zilretta.   She notes left lateral hip pain thought to be trochanteric bursitis.  Over 3 months ago she had a greater trochanter injection which worked until recently.  Dx imaging:  07/30/20 R knee XR   Pertinent review of systems: No fevers or chills  Relevant historical information: Hypertension.  Morbid obesity.  Sleep apnea.   Exam:  BP (!) 140/84   Pulse 84   Ht 4\' 10"  (1.473 m)   Wt 291 lb (132 kg)   SpO2 96%   BMI 60.82 kg/m  General: Well Developed, well nourished, and in no acute distress.   MSK: Right knee: Moderate effusion.  Normal-appearing otherwise. Normal motion. Stable ligamentous exam.  Left hip: Normal-appearing Tender palpation greater trochanter. Hip abduction strength is reduced.    Lab and Radiology Results  Procedure: Real-time Ultrasound Guided Injection of right knee joint superior lateral patella space Device: Philips Affiniti 50G/GE Logiq Images permanently stored and available for review in PACS Verbal informed consent obtained.  Discussed risks and benefits of procedure. Warned about infection, bleeding, hyperglycemia damage to structures among others. Patient expresses understanding and agreement Time-out conducted.   Noted no overlying erythema, induration, or other signs of  local infection.   Skin prepped in a sterile fashion.   Local anesthesia: Topical Ethyl chloride.   With sterile technique and under real time ultrasound guidance: 40 mg of Depo-Medrol and 2 mL of Marcaine injected into knee joint. Fluid seen entering the joint capsule.   Completed without difficulty   Pain immediately resolved suggesting accurate placement of the medication.   Advised to call if fevers/chills, erythema, induration, drainage, or persistent bleeding.   Images permanently stored and available for review in the ultrasound unit.  Impression: Technically successful ultrasound guided injection.   Procedure: Real-time Ultrasound Guided Injection of left lateral hip greater trochanter bursa Device: Philips Affiniti 50G/GE Logiq Images permanently stored and available for review in PACS Verbal informed consent obtained.  Discussed risks and benefits of procedure. Warned about infection, bleeding, hyperglycemia damage to structures among others. Patient expresses understanding and agreement Time-out conducted.   Noted no overlying erythema, induration, or other signs of local infection.   Skin prepped in a sterile fashion.   Local anesthesia: Topical Ethyl chloride.   With sterile technique and under real time ultrasound guidance: Milligrams of Depo-Medrol and 2 mL of Marcaine injected into greater trochanter bursa. Fluid seen entering the bursa.   Completed without difficulty   Pain immediately resolved suggesting accurate placement of the medication.   Advised to call if fevers/chills, erythema, induration, drainage, or persistent bleeding.   Images permanently stored and available for review in the ultrasound unit.  Impression: Technically successful ultrasound guided injection.         Assessment and Plan: 67 y.o. female with right knee pain due to exacerbation of DJD.  Plan for Depo-Medrol steroid injection.  Last steroid injection was triamcinolone.  Most recently she  had a trial of hyaluronic acid injections which did not help.  If this does not work very well next step would likely be Designer, multimedia which requires authorization.  If that does not work or we cannot get it would recommend genicular artery embolization with interventional radiology.  Left lateral hip pain thought to be trochanteric bursitis.  Plan for steroid injection and home exercise program today.   PDMP not reviewed this encounter. Orders Placed This Encounter  Procedures   [place] LIMITED JOINT SPACE STRUCTURES LOW RIGHT(NO LINKED CHARGES)    Order Specific Question:   Reason for Exam (SYMPTOM  OR DIAGNOSIS REQUIRED)    Answer:   right knee pain    Order Specific Question:   Preferred imaging location?    Answer:   Adult nurse [facility] ordered this encounter  Medications   methylPREDNISolone acetate (DEPO-MEDROL) injection 40 mg   methylPREDNISolone acetate (DEPO-MEDROL) injection 40 mg     Discussed warning signs or symptoms. Please see discharge instructions. Patient expresses understanding.   The above documentation has been reviewed and is accurate and complete [name], M.D.

## 2023-10-27 NOTE — Telephone Encounter (Signed)
Prior Auth REQUIRED for [employer]

## 2023-10-28 NOTE — Telephone Encounter (Signed)
Prior Authorization initiated for [facility] via [email or web address] KEY: [id number]

## 2023-10-29 NOTE — Telephone Encounter (Addendum)
Zilretta for RIGHT knee OA  OK to schedule after 11/25/23  Primary Insurance: [employer] Adv HMO Co-pay: $10 Co-insurance: undisclosed Deductible: does not apply Prior Auth: [place] Case ID #: [id number] Valid: 11/01/23-04/29/24   Knee Injection History 03/28/20 - Triamcinolone RIGHT 05/09/20 - Monovisc RIGHT 07/30/20 - Triamcinolone RIGHT 11/26/20 - Triamcinolone RIGHT 07/16/21 - Triamcinolone RIGHT 03/31/22 - Triamcinolone RIGHT 08/04/22 - Triamcinolone RIGHT 03/12/23 - Triamcinolone RIGHT 05/25/23 - Orthovisc #1 RIGHT 06/01/23 - Orthovisc #2 RIGHT 06/08/23 - Orthovisc #3 RIGHT 10/25/23 - Methylprednisolone RIGHT

## 2023-10-29 NOTE — Telephone Encounter (Signed)
Prior Auth for [place] APPROVED [place] Case ID #: [id number] Valid: 11/01/23-04/29/24

## 2023-10-29 NOTE — Telephone Encounter (Signed)
Holding until needed by the patient.

## 2023-11-01 ENCOUNTER — Encounter: Payer: Self-pay | Admitting: Nurse Practitioner

## 2023-11-03 ENCOUNTER — Other Ambulatory Visit: Payer: Self-pay

## 2023-11-03 ENCOUNTER — Other Ambulatory Visit (HOSPITAL_COMMUNITY): Payer: Self-pay

## 2023-11-03 ENCOUNTER — Encounter (HOSPITAL_COMMUNITY): Payer: Self-pay

## 2023-11-03 DIAGNOSIS — L821 Other seborrheic keratosis: Secondary | ICD-10-CM | POA: Diagnosis not present

## 2023-11-03 MED ORDER — TRIAMCINOLONE ACETONIDE 32 MG IX SRER
32.0000 mg | Freq: Once | INTRA_ARTICULAR | 0 refills | Status: AC
Start: 2023-11-03 — End: 2023-11-06
  Filled 2023-11-03 (×2): qty 1, 1d supply, fill #0

## 2023-11-03 NOTE — Progress Notes (Signed)
Specialty Pharmacy Initial Fill Coordination Note  Joan Russo is a 67 y.o. female contacted today regarding refills of specialty medication(s) Triamcinolone Acetonide (Glucocorticosteroids)   Patient requested Courier to Provider Office   Delivery date: 11/08/23   Verified address: [facility] [place]   Medication will be filled on 11/8.   Patient is aware of $95 copayment.

## 2023-11-03 NOTE — Addendum Note (Signed)
Addended by: Dierdre Searles on: 11/03/2023 08:51 AM   Modules accepted: Orders

## 2023-11-03 NOTE — Telephone Encounter (Signed)
Called pt and advised that Joan Russo was approved though pharmacy benefits. Pt agreeable to filling through [facility].   Rx sent.

## 2023-11-04 ENCOUNTER — Other Ambulatory Visit: Payer: Self-pay | Admitting: Nurse Practitioner

## 2023-11-04 ENCOUNTER — Other Ambulatory Visit: Payer: Medicare HMO

## 2023-11-04 DIAGNOSIS — E039 Hypothyroidism, unspecified: Secondary | ICD-10-CM

## 2023-11-05 ENCOUNTER — Other Ambulatory Visit: Payer: Self-pay

## 2023-11-05 ENCOUNTER — Other Ambulatory Visit (HOSPITAL_COMMUNITY): Payer: Self-pay

## 2023-11-10 ENCOUNTER — Encounter: Payer: Self-pay | Admitting: Nurse Practitioner

## 2023-11-22 ENCOUNTER — Other Ambulatory Visit: Payer: Self-pay

## 2023-11-22 NOTE — Telephone Encounter (Signed)
VOB initiated for Orthovisc for RIGHT knee OA.  

## 2023-11-23 ENCOUNTER — Ambulatory Visit: Payer: Medicare HMO | Admitting: Family Medicine

## 2023-11-23 ENCOUNTER — Other Ambulatory Visit: Payer: Self-pay

## 2023-11-23 VITALS — BP 140/84 | HR 84 | Ht <= 58 in | Wt 291.0 lb

## 2023-11-23 DIAGNOSIS — M7062 Trochanteric bursitis, left hip: Secondary | ICD-10-CM | POA: Diagnosis not present

## 2023-11-23 DIAGNOSIS — M25561 Pain in right knee: Secondary | ICD-10-CM

## 2023-11-23 DIAGNOSIS — M1711 Unilateral primary osteoarthritis, right knee: Secondary | ICD-10-CM | POA: Diagnosis not present

## 2023-11-23 DIAGNOSIS — G8929 Other chronic pain: Secondary | ICD-10-CM

## 2023-11-23 MED ORDER — TRIAMCINOLONE ACETONIDE 32 MG IX SRER
32.0000 mg | Freq: Once | INTRA_ARTICULAR | Status: AC
Start: 2023-11-23 — End: 2023-11-23
  Administered 2023-11-23: 32 mg via INTRA_ARTICULAR

## 2023-11-23 NOTE — Progress Notes (Unsigned)
   Rubin Payor, PhD, LAT, ATC acting as a scribe for [name], MD.  Joan Russo is a 67 y.o. female who presents to [facility] today for f/u R knee pain and Zilretta injection. Pt was last seen by Dr. Denyse Amass on 10/25/23 and was given a R knee steroid (Depo-Medrol) injection.  Today, pt reports she is hopeful that this Zilretta injection will provide her some relief. She inquired if she could have another hip injection.  Dx imaging:  07/30/20 R knee XR   Pertinent review of systems: No fevers or chills  Relevant historical information: Hypertension   Exam:  BP (!) 140/84   Pulse 84   Ht 4\' 10"  (1.473 m)   Wt 291 lb (132 kg)   SpO2 96%   BMI 60.82 kg/m  General: Well Developed, well nourished, and in no acute distress.   MSK: Right knee mild effusion    Lab and Radiology Results   Zilretta injection right knee Procedure: Real-time Ultrasound Guided Injection of right knee joint superior lateral patellar space Device: Philips Affiniti 50G Images permanently stored and available for review in PACS Verbal informed consent obtained.  Discussed risks and benefits of procedure. Warned about infection, hyperglycemia bleeding, damage to structures among others. Patient expresses understanding and agreement Time-out conducted.   Noted no overlying erythema, induration, or other signs of local infection.   Skin prepped in a sterile fashion.   Local anesthesia: Topical Ethyl chloride.   With sterile technique and under real time ultrasound guidance: Zilretta 32 mg injected into knee joint. Fluid seen entering the joint capsule.   Completed without difficulty   Advised to call if fevers/chills, erythema, induration, drainage, or persistent bleeding.   Images permanently stored and available for review in the ultrasound unit.  Impression: Technically successful ultrasound guided injection.  Lot number: [id number] Patient provided her own  Zilretta through the pharmacy benefit.  Do not charge for [employer] today.    Assessment and Plan: 67 y.o. female with right knee pain due to osteoarthritis.  Plan for Zilretta injection today.  Can repeat greater trochanter injection left hip in about 2 months.  If the Zilretta injection today does not work well enough next step for her should be a referral to interventional radiology to consider genicular artery embolization.   PDMP not reviewed this encounter. Orders Placed This Encounter  Procedures   [place] LIMITED JOINT SPACE STRUCTURES LOW RIGHT(NO LINKED CHARGES)    Order Specific Question:   Reason for Exam (SYMPTOM  OR DIAGNOSIS REQUIRED)    Answer:   right knee pain    Order Specific Question:   Preferred imaging location?    Answer:   Adult nurse [facility] ordered this encounter  Medications   Triamcinolone Acetonide (ZILRETTA) intra-articular injection 32 mg     Discussed warning signs or symptoms. Please see discharge instructions. Patient expresses understanding.   The above documentation has been reviewed and is accurate and complete [name], M.D.

## 2023-11-23 NOTE — Patient Instructions (Signed)
Thank you for coming in today.   You received an injection today. Seek immediate medical attention if the joint becomes red, extremely painful, or is oozing fluid.   Check back as needed 

## 2023-11-24 NOTE — Telephone Encounter (Addendum)
ORTHOVISC for RIGHT knee OA  OK to schedule on or after 12/09/23  Primary Insurance: [employer] Medicare Adv Co-pay: $10 Co-insurance: undisclosed Deductible: does not apply Prior Auth: NOT required   Knee Injection History 03/28/20 - Triamcinolone RIGHT 05/09/20 - Monovisc RIGHT 07/30/20 - Triamcinolone RIGHT 11/26/20 - Triamcinolone RIGHT 07/16/21 - Triamcinolone RIGHT 03/31/22 - Triamcinolone RIGHT 08/04/22 - Triamcinolone RIGHT 03/12/23 - Triamcinolone RIGHT 05/25/23 - Orthovisc #1 RIGHT 06/01/23 - Orthovisc #2 RIGHT 06/08/23 - Orthovisc #3 RIGHT 10/25/23 - Methylprednisolone RIGHT 11/23/23 - ZILRETTA RIGHT

## 2023-11-24 NOTE — Telephone Encounter (Signed)
Prior Auth NOT required for ORTHOVISC for RIGHT knee OA.

## 2023-11-29 NOTE — Telephone Encounter (Signed)
Patient received Zilretta.

## 2023-12-11 ENCOUNTER — Other Ambulatory Visit: Payer: Self-pay | Admitting: Nurse Practitioner

## 2023-12-11 DIAGNOSIS — F341 Dysthymic disorder: Secondary | ICD-10-CM

## 2023-12-16 ENCOUNTER — Other Ambulatory Visit: Payer: Self-pay | Admitting: Family Medicine

## 2023-12-16 ENCOUNTER — Other Ambulatory Visit (HOSPITAL_COMMUNITY): Payer: Self-pay

## 2023-12-16 ENCOUNTER — Other Ambulatory Visit: Payer: Self-pay

## 2023-12-16 DIAGNOSIS — M1711 Unilateral primary osteoarthritis, right knee: Secondary | ICD-10-CM

## 2023-12-16 NOTE — Telephone Encounter (Signed)
Last Zilretta inj 11/23/23 Can consider repeat inj on or after 02/16/24

## 2023-12-30 ENCOUNTER — Encounter: Payer: No Typology Code available for payment source | Admitting: Nurse Practitioner

## 2023-12-30 NOTE — Telephone Encounter (Signed)
 VOB initiated for 2025 coverage for Zilretta.

## 2023-12-31 ENCOUNTER — Encounter: Payer: Self-pay | Admitting: Nurse Practitioner

## 2024-01-12 NOTE — Telephone Encounter (Signed)
 Per [employer]  policy # [id number] has been terminated as of 12/28/2023  Update insurance

## 2024-01-17 NOTE — Telephone Encounter (Signed)
VOB initiated for ORTHOVISC for RIGHT knee OA.

## 2024-01-25 NOTE — Telephone Encounter (Addendum)
[employer] policy has termed. Updated to [facility], VOB resubmitted.

## 2024-01-26 NOTE — Telephone Encounter (Signed)
Medical Buy and Annette Stable - Prior Authorization REQUIRED  Pharmacy Benefit - NOT covered

## 2024-01-28 NOTE — Telephone Encounter (Signed)
   [employer] Subscriber [id number]   Insurance updated, VOB re-submitted

## 2024-01-31 NOTE — Telephone Encounter (Signed)
Medical Buy and Bill   Prior Authorization initiated for [facility] via [employer] Case ID: [id number]

## 2024-01-31 NOTE — Telephone Encounter (Addendum)
ZILRETTA for RIGHT knee OA  OK to schedule on or after 02/16/24  Medical Buy and [employer]  Primary Insurance: [employer] Co-pay: $45 Co-insurance: 20% Deductible: does not apply Prior Auth: NOT required  Pharmacy Benefit - [employer]  Copay: $607.29 Prior Auth: NOT required Can send to [facility] if using pharmacy benefits   Knee Injection History 03/28/20 - Triamcinolone RIGHT 05/09/20 - Monovisc RIGHT 07/30/20 - Triamcinolone RIGHT 11/26/20 - Triamcinolone RIGHT 07/16/21 - Triamcinolone RIGHT 03/31/22 - Triamcinolone RIGHT 08/04/22 - Triamcinolone RIGHT 03/12/23 - Triamcinolone RIGHT 05/25/23 - Orthovisc #1 RIGHT 06/01/23 - Orthovisc #2 RIGHT 06/08/23 - Orthovisc #3 RIGHT 10/25/23 - Methylprednisolone RIGHT 11/23/23 - Zilretta RIGHT

## 2024-01-31 NOTE — Telephone Encounter (Signed)
Pharmacy Benefit  Prior Authorization initiated for ORTHOVISC via [email or web address] KEY: [id number]    Outcome Additional Information Required [facility] is not able to process this request through ePA, please contact the plan at [phone] or fax in request to [phone]. NOTICE FOR OPIOID REQUESTS ONLY: Effective Dec 28, 2017 Medicare Part D plans have implemented new opioid safety edits, including a 7 day supply limit for opioid naive patients.   PA request faxed to [phone]

## 2024-01-31 NOTE — Telephone Encounter (Signed)
Medical Buy and Annette Stable - Prior Authorization NOT required  Pharmacy Benefit - Prior Authorization NOT required

## 2024-02-01 NOTE — Telephone Encounter (Signed)
 Holding until needed by patient.

## 2024-02-01 NOTE — Telephone Encounter (Deleted)
 Joan Russo

## 2024-02-02 NOTE — Telephone Encounter (Signed)
Medical Buy and Annette Stable  Prior Auth for [facility] APPROVED PA# [id number] Valid:01/31/24-12/27/24      Pharmacy Benefit - pending review

## 2024-02-08 NOTE — Telephone Encounter (Signed)
Form printed from [facility] for pharmacy benefits/auth.

## 2024-02-09 NOTE — Telephone Encounter (Signed)
Prior Auth form for Orthovisc completed and faxed to [facility].

## 2024-02-11 ENCOUNTER — Other Ambulatory Visit: Payer: Self-pay

## 2024-02-14 ENCOUNTER — Other Ambulatory Visit: Payer: Self-pay

## 2024-02-14 NOTE — Telephone Encounter (Signed)
 ORTHOVISC for RIGHT knee OA  OK to schedule on or after 02/09/24  Medical Benefit  Primary Insurance: [employer] Co-pay: $45 Co-insurance: 20% Deductible: does not apply Prior Auth: APPROVED PA# [id number] Valid: 02/09/24-12/27/24  Pharmacy Benefit  PBM: [facility] Co-pay:  Co-insurance:  Deductible:  Prior Auth:   Knee Injection History 03/28/20 - Triamcinolone RIGHT 05/09/20 - Monovisc RIGHT 07/30/20 - Triamcinolone RIGHT 11/26/20 - Triamcinolone RIGHT 07/16/21 - Triamcinolone RIGHT 03/31/22 - Triamcinolone RIGHT 08/04/22 - Triamcinolone RIGHT 03/12/23 - Triamcinolone RIGHT 05/25/23 - Orthovisc #1 RIGHT 06/01/23 - Orthovisc #2 RIGHT 06/08/23 - Orthovisc #3 RIGHT 10/25/23 - Methylprednisolone RIGHT 11/23/23 - Zilretta RIGHT

## 2024-02-14 NOTE — Telephone Encounter (Signed)
 Holding until needed by patient.

## 2024-02-14 NOTE — Telephone Encounter (Signed)
 Medical Buy and Annette Stable  Prior Auth for [employer] APPROVED PA# [id number] Valid: 02/09/24-12/27/24    Pharmacy Benefit  Prior Auth for ORTHOVISC APPROVED PBM: [facility] PA#

## 2024-02-14 NOTE — Progress Notes (Signed)
 Patient states that medication was not effective.

## 2024-02-16 ENCOUNTER — Other Ambulatory Visit: Payer: Self-pay

## 2024-02-23 ENCOUNTER — Other Ambulatory Visit: Payer: Self-pay | Admitting: Nurse Practitioner

## 2024-02-23 DIAGNOSIS — M797 Fibromyalgia: Secondary | ICD-10-CM

## 2024-03-03 ENCOUNTER — Ambulatory Visit
Admission: RE | Admit: 2024-03-03 | Discharge: 2024-03-03 | Disposition: A | Discharge status: Home / Self Care | Payer: PRIVATE HEALTH INSURANCE | Source: Ambulatory Visit | Attending: Nurse Practitioner | Admitting: Nurse Practitioner

## 2024-03-03 ENCOUNTER — Encounter: Payer: Self-pay | Admitting: Nurse Practitioner

## 2024-03-03 ENCOUNTER — Ambulatory Visit (INDEPENDENT_AMBULATORY_CARE_PROVIDER_SITE_OTHER): Payer: No Typology Code available for payment source | Admitting: Nurse Practitioner

## 2024-03-03 VITALS — BP 130/80 | HR 70 | Temp 97.9°F | Ht <= 58 in | Wt 231.6 lb

## 2024-03-03 DIAGNOSIS — R0982 Postnasal drip: Secondary | ICD-10-CM

## 2024-03-03 DIAGNOSIS — M8580 Other specified disorders of bone density and structure, unspecified site: Secondary | ICD-10-CM | POA: Diagnosis not present

## 2024-03-03 DIAGNOSIS — Z0000 Encounter for general adult medical examination without abnormal findings: Secondary | ICD-10-CM | POA: Diagnosis not present

## 2024-03-03 DIAGNOSIS — G4733 Obstructive sleep apnea (adult) (pediatric): Secondary | ICD-10-CM | POA: Diagnosis not present

## 2024-03-03 DIAGNOSIS — M797 Fibromyalgia: Secondary | ICD-10-CM

## 2024-03-03 DIAGNOSIS — Z1231 Encounter for screening mammogram for malignant neoplasm of breast: Secondary | ICD-10-CM

## 2024-03-03 DIAGNOSIS — E039 Hypothyroidism, unspecified: Secondary | ICD-10-CM | POA: Diagnosis not present

## 2024-03-03 DIAGNOSIS — I10 Essential (primary) hypertension: Secondary | ICD-10-CM

## 2024-03-03 DIAGNOSIS — G479 Sleep disorder, unspecified: Secondary | ICD-10-CM | POA: Diagnosis not present

## 2024-03-03 MED ORDER — FLUTICASONE PROPIONATE 50 MCG/ACT NA SUSP: 2.0000 | Freq: Every day | NASAL | 2 refills | Status: AC

## 2024-03-03 MED ORDER — TRAZODONE HCL 50 MG PO TABS: 25.0000 mg | ORAL_TABLET | Freq: Every evening | ORAL | 0 refills | Status: DC | PRN

## 2024-03-03 NOTE — Assessment & Plan Note (Signed)
 History of the same.  Patient was followed by pulmonology.  She is adherent to her CPAP therapy encouraged follow-up with pulmonology and continuing CPAP as directed

## 2024-03-03 NOTE — Assessment & Plan Note (Signed)
 Patient blood pressure under normal limits today patient currently maintained on lifestyle modifications slowly.  Continue with lifestyle modifications

## 2024-03-03 NOTE — Assessment & Plan Note (Signed)
 Patient currently maintained on Lyrica 75 mg twice daily.  Stable continue medications prescribed

## 2024-03-03 NOTE — Progress Notes (Signed)
 Established Patient Office Visit  Subjective   Patient ID: Joan Russo, female    DOB: 1956/06/19  Age: 68 y.o. MRN: [id number]  Chief Complaint  Patient presents with   Annual Exam    Pt requests need of medication to help with sleep.     HPI  HTN: history of the same. Currenlty on lifestyle modifications   OSA: pulmon follows last office vist was in 2023. Is still using   Osteopenia: over due for a dexa scan.   Fibromyaglia: lyrica 75 mg twice daily.  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  Hypothyroid: currently maintained on levothyroxine  Insomnia: patient is requested medication to help with sleep. States that she has been going to bed aroun 830 and will wake up several times at night. She thought it was because of her early bed time. States last night went at 1030 and got appprox 2 hours of sleep.   for complete physical and follow up of chronic conditions.  Immunizations: -Tetanus: Completed in unsure get at local pharmacy -Influenza: 10/24/2023 -Shingles: get at local pharmacy  -Pneumonia: Completed   Diet: Fair diet. She will eat 1-2 meals most days. She does not eat regulary. She does snack.  She does not eat breakfast and will eat around 2pm. She is drinking  diet soda (3) a day   Exercise: No regular exercise. She joined a "walk fit" that starts with chair exercise   Eye exam: Completes annually. Holli Humbles is the specialist he saw 1 time. Readers  Dental exam: Completes semi-annually    Colonoscopy: Completed in 07/07/2016, repeat in 5 years. Wants cologuard  Lung Cancer Screening: N/A  Pap smear: Aged out  Mammogram: Due today patient will do the mobile mammogram  Dexa: [facility] breast center order placed  Advanced directives: HPA, living will, and will per patient's report       Review of Systems  Constitutional:  Negative for chills and fever.  Respiratory:  Negative for  shortness of breath.   Cardiovascular:  Negative for chest pain and leg swelling.  Gastrointestinal:  Negative for abdominal pain, blood in stool, constipation, diarrhea, nausea and vomiting.       BM every other day  Genitourinary:  Negative for dysuria and hematuria.  Neurological:  Negative for tingling and headaches.  Psychiatric/Behavioral:  Negative for hallucinations and suicidal ideas.       Objective:     BP 130/80   Pulse 70   Temp 97.9 F (36.6 C) (Oral)   Ht 4\' 10"  (1.473 m)   Wt 231 lb 9.6 oz (105.1 kg)   SpO2 98%   BMI 48.40 kg/m  BP Readings from Last 3 Encounters:  03/03/24 130/80  11/23/23 (!) 140/84  10/25/23 (!) 140/84   Wt Readings from Last 3 Encounters:  03/03/24 231 lb 9.6 oz (105.1 kg)  11/23/23 291 lb (132 kg)  10/25/23 291 lb (132 kg)   SpO2 Readings from Last 3 Encounters:  03/03/24 98%  11/23/23 96%  10/25/23 96%      Physical Exam Vitals and nursing note reviewed.  Constitutional:      Appearance: Normal appearance.  HENT:     Right Ear: Tympanic membrane, ear canal and external ear normal.     Left Ear: Tympanic membrane, ear canal and external ear normal.     Mouth/Throat:     Mouth: Mucous membranes are moist.     Pharynx: Oropharynx is clear.  Eyes:     Extraocular Movements: Extraocular movements intact.     Pupils: Pupils are equal, round, and reactive to light.  Cardiovascular:     Rate and Rhythm: Normal rate and regular rhythm.     Pulses: Normal pulses.     Heart sounds: Normal heart sounds.  Pulmonary:     Effort: Pulmonary effort is normal.     Breath sounds: Normal breath sounds.  Abdominal:     General: Bowel sounds are normal. There is no distension.     Palpations: There is no mass.     Tenderness: There is no abdominal tenderness.     Hernia: No hernia is present.  Musculoskeletal:     Right lower leg: No edema.     Left lower leg: No edema.  Lymphadenopathy:     Cervical: No cervical adenopathy.   Skin:    General: Skin is warm.  Neurological:     General: No focal deficit present.     Mental Status: She is alert.     Deep Tendon Reflexes:     Reflex Scores:      Bicep reflexes are 2+ on the right side and 2+ on the left side.      Patellar reflexes are 2+ on the right side and 2+ on the left side.    Comments: Bilateral upper and lower extremity strength 5/5  Psychiatric:        Mood and Affect: Mood normal.        Behavior: Behavior normal.        Thought Content: Thought content normal.        Judgment: Judgment normal.      No results found for any visits on 03/03/24.    The 10-year ASCVD risk score (Arnett DK, et al., 2019) is: 8.7%    Assessment & Plan:   Problem List Items Addressed This Visit       Cardiovascular and Mediastinum   HTN (hypertension)   Patient blood pressure under normal limits today patient currently maintained on lifestyle modifications slowly.  Continue with lifestyle modifications        Respiratory   OSA (obstructive sleep apnea)   History of the same.  Patient was followed by pulmonology.  She is adherent to her CPAP therapy encouraged follow-up with pulmonology and continuing CPAP as directed        Endocrine   Hypothyroidism   History of same last TSH within normal limits.  Continue levothyroxine 50 mcg daily        Musculoskeletal and Integument   Osteopenia   History of the same.  Patient is overdue for DEXA scan order placed today patient given information to call and set up scan at her convenience      Relevant Orders   DG Bone Density     Other   Preventative health care - Primary   Discussed age-appropriate immunizations and screening exams.  Did review patient's personal, surgical, social, family histories.  Patient is up-to-date on all age-appropriate vaccinations she would like.  She can get her tetanus vaccine and shingles vaccine at local pharmacy.  Cologuard ordered for CRC screening.  The recommendation  to colonoscopy that she deferred to Cologuard.  Mammogram has been ordered to get done today with no remarkably office.  Patient has aged out of cervical cancer screening.  Patient was given information at discharge about preventative healthcare maintenance with anticipatory guidance.      Fibromyalgia   Patient currently maintained  on Lyrica 75 mg twice daily.  Stable continue medications prescribed      Relevant Medications   traZODone (DESYREL) 50 MG tablet   PND (post-nasal drip)   Relevant Medications   fluticasone (FLONASE) 50 MCG/ACT nasal spray   Sleep disturbance   Will trial patient on trazodone 50 mg tablets 0.5 to 1 tablet nightly as needed.      Relevant Medications   traZODone (DESYREL) 50 MG tablet   Other Visit Diagnoses       Screening mammogram for breast cancer           Return in about 6 months (around 09/03/2024) for fibro/sleep .    Audria Nine, NP

## 2024-03-03 NOTE — Assessment & Plan Note (Signed)
 History of same last TSH within normal limits.  Continue levothyroxine 50 mcg daily

## 2024-03-03 NOTE — Patient Instructions (Signed)
 Nice to see you today Follow up with me in 6 months, sooner if you need me Try the trazodone for your sleep

## 2024-03-03 NOTE — Assessment & Plan Note (Signed)
 Will trial patient on trazodone 50 mg tablets 0.5 to 1 tablet nightly as needed.

## 2024-03-03 NOTE — Assessment & Plan Note (Signed)
 History of the same.  Patient is overdue for DEXA scan order placed today patient given information to call and set up scan at her convenience

## 2024-03-03 NOTE — Assessment & Plan Note (Signed)
 Discussed age-appropriate immunizations and screening exams.  Did review patient's personal, surgical, social, family histories.  Patient is up-to-date on all age-appropriate vaccinations she would like.  She can get her tetanus vaccine and shingles vaccine at local pharmacy.  Cologuard ordered for CRC screening.  The recommendation to colonoscopy that she deferred to Cologuard.  Mammogram has been ordered to get done today with no remarkably office.  Patient has aged out of cervical cancer screening.  Patient was given information at discharge about preventative healthcare maintenance with anticipatory guidance.

## 2024-03-13 ENCOUNTER — Encounter: Payer: Self-pay | Admitting: Nurse Practitioner

## 2024-03-13 NOTE — Telephone Encounter (Signed)
 Completed Orthovisc series June 2024.   Received Zilretta inj 11/23/23.

## 2024-03-26 ENCOUNTER — Other Ambulatory Visit: Payer: Self-pay | Admitting: Nurse Practitioner

## 2024-03-26 DIAGNOSIS — G479 Sleep disorder, unspecified: Secondary | ICD-10-CM

## 2024-03-27 ENCOUNTER — Encounter: Payer: Self-pay | Admitting: Nurse Practitioner

## 2024-04-21 DIAGNOSIS — F3341 Major depressive disorder, recurrent, in partial remission: Secondary | ICD-10-CM | POA: Diagnosis not present

## 2024-04-21 DIAGNOSIS — E785 Hyperlipidemia, unspecified: Secondary | ICD-10-CM | POA: Diagnosis not present

## 2024-04-21 DIAGNOSIS — M797 Fibromyalgia: Secondary | ICD-10-CM | POA: Diagnosis not present

## 2024-04-21 DIAGNOSIS — Z008 Encounter for other general examination: Secondary | ICD-10-CM | POA: Diagnosis not present

## 2024-04-21 DIAGNOSIS — G4733 Obstructive sleep apnea (adult) (pediatric): Secondary | ICD-10-CM | POA: Diagnosis not present

## 2024-04-21 DIAGNOSIS — N1831 Chronic kidney disease, stage 3a: Secondary | ICD-10-CM | POA: Diagnosis not present

## 2024-04-21 DIAGNOSIS — Z1211 Encounter for screening for malignant neoplasm of colon: Secondary | ICD-10-CM | POA: Diagnosis not present

## 2024-04-21 DIAGNOSIS — Z6841 Body Mass Index (BMI) 40.0 and over, adult: Secondary | ICD-10-CM | POA: Diagnosis not present

## 2024-04-21 DIAGNOSIS — M1711 Unilateral primary osteoarthritis, right knee: Secondary | ICD-10-CM | POA: Diagnosis not present

## 2024-05-10 LAB — FECAL OCCULT BLOOD, IMMUNOCHEMICAL: IFOBT: NEGATIVE

## 2024-05-17 LAB — IFOBT (OCCULT BLOOD): IFOBT: NEGATIVE

## 2024-05-26 ENCOUNTER — Other Ambulatory Visit: Payer: Self-pay | Admitting: Nurse Practitioner

## 2024-05-26 ENCOUNTER — Encounter: Payer: Self-pay | Admitting: Nurse Practitioner

## 2024-05-26 DIAGNOSIS — M797 Fibromyalgia: Secondary | ICD-10-CM

## 2024-06-06 ENCOUNTER — Encounter: Payer: Self-pay | Admitting: Nurse Practitioner

## 2024-06-07 ENCOUNTER — Ambulatory Visit (INDEPENDENT_AMBULATORY_CARE_PROVIDER_SITE_OTHER): Payer: PRIVATE HEALTH INSURANCE | Admitting: Sleep Medicine

## 2024-06-07 ENCOUNTER — Encounter: Payer: Self-pay | Admitting: Sleep Medicine

## 2024-06-07 VITALS — BP 140/90 | HR 81 | Temp 98.0°F | Ht <= 58 in | Wt 235.4 lb

## 2024-06-07 DIAGNOSIS — E039 Hypothyroidism, unspecified: Secondary | ICD-10-CM | POA: Diagnosis not present

## 2024-06-07 DIAGNOSIS — G4733 Obstructive sleep apnea (adult) (pediatric): Secondary | ICD-10-CM | POA: Diagnosis not present

## 2024-06-07 NOTE — Patient Instructions (Signed)

## 2024-06-07 NOTE — Telephone Encounter (Signed)
 Can we print the order for the DEXA scan and fax it to the place requested

## 2024-06-07 NOTE — Progress Notes (Signed)
 Name:Joan Russo MRN: [id number] DOB: 03-21-1956   CHIEF COMPLAINT:  CPAP F/U   HISTORY OF PRESENT ILLNESS:  Joan Russo is a 68 y.o. w/ a h/o OSA, hypothyroidism and morbid obesity who presents for CPAP follow up visit. Reports using CPAP therapy every night, which is confirmed by compliance data. She is currently using the Airfit P10 nasal pillow mask, which causes discomfort. Overall states that she feels more refreshed upon awakening with CPAP therapy.    EPWORTH SLEEP SCORE     09/25/2020   10:00 AM  Results of the Epworth flowsheet  Sitting and reading 1  Watching TV 1  Sitting, inactive in a public place (e.g. a theatre or a meeting) 1  As a passenger in a car for an hour without a break 0  Lying down to rest in the afternoon when circumstances permit 1  Sitting and talking to someone 0  Sitting quietly after a lunch without alcohol 0  In a car, while stopped for a few minutes in traffic 0  Total score 4    PAST MEDICAL HISTORY :   has a past medical history of Anxiety, Anxiety and depression, Depression, Diverticulosis, GERD (gastroesophageal reflux disease), Internal hemorrhoids, OA (osteoarthritis), Obesity, Class III, BMI 40-49.9 (morbid obesity), Osteopenia, Thyroid  disease, Tubular adenoma, Urinary incontinence, and Vitamin D  deficiency.  has a past surgical history that includes Total knee arthroplasty (08/2008); Hysteroscopy with D & C (N/A, 01/16/2015); Esophagogastroduodenoscopy (egd) with propofol  (N/A, 07/07/2016); Colonoscopy with propofol  (N/A, 07/07/2016); Joint replacement; Hysteroscopy with D & C (N/A, 06/06/2019); and Breast biopsy (Right). Prior to Admission medications   Medication Sig Start Date End Date Taking? Authorizing Provider  ALPRAZolam  (XANAX ) 0.5 MG tablet TAKE 1 TABLET BY MOUTH AT BEDTIME AS NEEDED FOR ANXIETY. 10/21/23  Yes Dorothe Gaster, NP  buPROPion  (WELLBUTRIN  XL) 300 MG 24 hr tablet TAKE 1 TABLET BY MOUTH EVERY DAY 12/13/23   Yes Dorothe Gaster, NP  clobetasol  cream (TEMOVATE ) 0.05 % APPLY 1 APPLICATION TOPICALLY 2 (TWO) TIMES DAILY. X 6 WKS, THEN DROP TO ONCE DAILY X 6 WKS, THEN 2X/WK 07/05/23  Yes Dorothe Gaster, NP  fluticasone  (FLONASE ) 50 MCG/ACT nasal spray Place 2 sprays into both nostrils daily. 03/03/24  Yes Dorothe Gaster, NP  ibuprofen  (ADVIL ) 600 MG tablet Take 1 tablet (600 mg total) by mouth every 6 (six) hours as needed. 06/06/19  Yes Schuman, Christanna R, MD  levothyroxine  (SYNTHROID ) 50 MCG tablet TAKE 1 TABLET BY MOUTH EVERY DAY 11/04/23  Yes Dorothe Gaster, NP  Multiple Vitamin (MULTIVITAMIN WITH MINERALS) TABS tablet Take 1 tablet by mouth daily.   Yes [provider]  NON FORMULARY CBD GUMMIES -to help with knee pain   Yes [provider]  pregabalin  (LYRICA ) 75 MG capsule TAKE 1 CAPSULE BY MOUTH TWICE A DAY 05/26/24  Yes Dorothe Gaster, NP  traZODone  (DESYREL ) 50 MG tablet Take 0.5-1 tablets (25-50 mg total) by mouth at bedtime as needed. 03/03/24  Yes Dorothe Gaster, NP  VITAMIN D , CHOLECALCIFEROL, PO Take 1 tablet by mouth daily in the afternoon.   Yes [provider]  cyclobenzaprine  (FLEXERIL ) 5 MG tablet Take 1 tablet (5 mg total) by mouth at bedtime as needed for muscle spasms. Patient not taking: Reported on 06/07/2024 10/12/22   Dorothe Gaster, NP  hydrocortisone  2.5 % cream Apply topically 2 (two) times daily. Patient not taking: Reported on 06/07/2024 10/21/23   Winthrop Hawks  M, NP   No Known Allergies  FAMILY HISTORY:  family history includes Breast cancer (age of onset: 19) in her daughter; Breast cancer (age of onset: 70) in her mother; Cancer in her father; Cancer (age of onset: 58) in her daughter; Cancer (age of onset: 61) in her mother. SOCIAL HISTORY:  reports that she has never smoked. She has never used smokeless tobacco. She reports that she does not drink alcohol and does not use drugs.   Review of Systems:  Gen:  Denies  fever, sweats, chills weight loss   HEENT: Denies blurred vision, double vision, ear pain, eye pain, hearing loss, nose bleeds, sore throat Cardiac:  No dizziness, chest pain or heaviness, chest tightness,edema, No JVD Resp:   No cough, -sputum production, -shortness of breath,-wheezing, -hemoptysis,  Gi: Denies swallowing difficulty, stomach pain, nausea or vomiting, diarrhea, constipation, bowel incontinence Gu:  Denies bladder incontinence, burning urine Ext:   Denies Joint pain, stiffness or swelling Skin: Denies  skin rash, easy bruising or bleeding or hives Endoc:  Denies polyuria, polydipsia , polyphagia or weight change Psych:   Denies depression, insomnia or hallucinations  Other:  All other systems negative  VITAL SIGNS: BP (!) 140/90 (BP Location: Left Arm, Patient Position: Sitting, Cuff Size: Normal)   Pulse 81   Temp 98 F (36.7 C) (Oral)   Ht 4' 10 (1.473 m)   Wt 235 lb 6.4 oz (106.8 kg)   SpO2 98%   BMI 49.20 kg/m    Physical Examination:   General Appearance: No distress  EYES PERRLA, EOM intact.   NECK Supple, No JVD Pulmonary: normal breath sounds, No wheezing.  CardiovascularNormal S1,S2.  No m/r/g.   Abdomen: Benign, Soft, non-tender. Skin:   warm, no rashes, no ecchymosis  Extremities: normal, no cyanosis, clubbing. Neuro:without focal findings,  speech normal  PSYCHIATRIC: Mood, affect within normal limits.   ASSESSMENT AND PLAN  OSA Patient is using and benefiting from CPAP therapy. For mask discomfort, will try patient on the AirTouch N30i nasal mask. Also sent order for new CPAP supplies and mask to [facility]. Discussed the consequences of untreated sleep apnea. Advised not to drive drowsy for safety of patient and others. Will complete further evaluation with a home sleep study and follow up to review results.    Hypothyroidism Stable, on current management.   Morbid obesity Counseled patient on diet and lifestyle modification.    Patient  satisfied with Plan of  action and management. All questions answered  I spent a total of 42 minutes reviewing chart data, face-to-face evaluation with the patient, counseling and coordination of care as detailed above.    Rutherford Alarie, M.D.  Sleep Medicine [facility]

## 2024-06-08 ENCOUNTER — Ambulatory Visit: Payer: Medicare HMO

## 2024-06-08 VITALS — Ht <= 58 in | Wt 235.0 lb

## 2024-06-08 DIAGNOSIS — Z13820 Encounter for screening for osteoporosis: Secondary | ICD-10-CM

## 2024-06-08 DIAGNOSIS — Z0000 Encounter for general adult medical examination without abnormal findings: Secondary | ICD-10-CM

## 2024-06-08 NOTE — Telephone Encounter (Signed)
 Order has been faxed

## 2024-06-08 NOTE — Patient Instructions (Addendum)
 Joan Russo , Thank you for taking time out of your busy schedule to complete your Annual Wellness Visit with me. I enjoyed our conversation and look forward to speaking with you again next year. I, as well as your care team,  appreciate your ongoing commitment to your health goals. Please review the following plan we discussed and let me know if I can assist you in the future. Your Game plan/ To Do List    Referrals: You have an order for:  []   2D Mammogram  []   3D Mammogram  [x]   Bone Density     Please call for appointment:  [facility]  [address] [place] [address] [phone]  [facility] [address] [place], [place] [address] [phone]  [facility] [address] Tracey [place], [place] [address] [phone]    Make sure to wear two-piece clothing.  No lotions, powders, or deodorants the day of the appointment. Make sure to bring picture ID and insurance card.  Bring list of medications you are currently taking including any supplements.   Follow up Visits: Next Medicare AWV with our clinical staff: 03/08/25 @  10:10am televisit   Have you seen your provider in the last 6 months (3 months if uncontrolled diabetes)? Yes Next Office Visit with your provider: not scheduled yet:pt says she will schedule later in year as Due in March for CPE  Clinician Recommendations:  Aim for 30 minutes of exercise or brisk walking, 6-8 glasses of water, and 5 servings of fruits and vegetables each day.       This is a list of the screening recommended for you and due dates:  Health Maintenance  Topic Date Due   Hepatitis C Screening  Never done   DTaP/Tdap/Td vaccine (1 - Tdap) Never done   Zoster (Shingles) Vaccine (1 of 2) Never done   DEXA scan (bone density measurement)  04/08/2019   COVID-19 Vaccine (4 - 2024-25 season) 12/19/2023   Flu  Shot  07/28/2024   Medicare Annual Wellness Visit  06/08/2025   Mammogram  03/03/2026   Colon Cancer Screening  07/07/2026   Pneumococcal Vaccine for age over 86  Completed   HPV Vaccine  Aged Out   Meningitis B Vaccine  Aged Out    Advanced directives: (Copy Requested) Please bring a copy of your health care power of attorney and living will to the office to be added to your chart at your convenience. You can mail to [facility] [address] [place], [place] [address] or email to [email or web address] Advance Care Planning is important because it:  [x]  Makes sure you receive the medical care that is consistent with your values, goals, and preferences  [x]  It provides guidance to your family and loved ones and reduces their decisional burden about whether or not they are making the right decisions based on your wishes.  Follow the link provided in your after visit summary or read over the paperwork we have mailed to you to help you started getting your Advance Directives in place. If you need assistance in completing these, please reach out to us  so that we can help you!

## 2024-06-08 NOTE — Progress Notes (Signed)
 Subjective:   Joan Russo is a 68 y.o. who presents for a Medicare Wellness preventive visit.  As a reminder, Annual Wellness Visits don't include a physical exam, and some assessments may be limited, especially if this visit is performed virtually. We may recommend an in-person follow-up visit with your provider if needed.  Visit Complete: Virtual I connected with  Joan Russo on 06/08/24 by a audio enabled telemedicine application and verified that I am speaking with the correct person using two identifiers.  Patient Location: Home  Provider Location: Office/Clinic  I discussed the limitations of evaluation and management by telemedicine. The patient expressed understanding and agreed to proceed.  Vital Signs: Because this visit was a virtual/telehealth visit, some criteria may be missing or patient reported. Any vitals not documented were not able to be obtained and vitals that have been documented are patient reported.  VideoDeclined- This patient declined Librarian, academic. Therefore the visit was completed with audio only.  Persons Participating in Visit: Patient.  AWV Questionnaire: No: Patient Medicare AWV questionnaire was not completed prior to this visit.  Cardiac Risk Factors include: advanced age (>92men, >2 women);hypertension;obesity (BMI >30kg/m2);sedentary lifestyle     Objective:    Today's Vitals   06/08/24 1022 06/08/24 1023  Weight: 235 lb (106.6 kg)   Height: 4' 10 (1.473 m)   PainSc:  5    Body mass index is 49.12 kg/m.     06/08/2024   10:38 AM 06/08/2023   10:01 AM 06/05/2022    8:58 AM 06/04/2021    8:16 AM 05/14/2020    2:05 PM 03/27/2020   11:10 AM 11/29/2019    8:59 AM  Advanced Directives  Does Patient Have a Medical Advance Directive? Yes Yes Yes Yes Yes No Yes  Type of Estate agent of [place];Living will Healthcare Power of [place];Living will Healthcare Power of  [place];Living will Healthcare Power of [place];Living will Healthcare Power of [place];Living will  Healthcare Power of Attorney  Does patient want to make changes to medical advance directive?   Yes (Inpatient - patient defers changing a medical advance directive and declines information at this time)    No - Patient declined  Copy of Healthcare Power of Attorney in Chart? No - copy requested No - copy requested No - copy requested No - copy requested No - copy requested    Would patient like information on creating a medical advance directive?      No - Patient declined     Current Medications (verified) Outpatient Encounter Medications as of 06/08/2024  Medication Sig   ALPRAZolam  (XANAX ) 0.5 MG tablet TAKE 1 TABLET BY MOUTH AT BEDTIME AS NEEDED FOR ANXIETY.   buPROPion  (WELLBUTRIN  XL) 300 MG 24 hr tablet TAKE 1 TABLET BY MOUTH EVERY DAY   clobetasol  cream (TEMOVATE ) 0.05 % APPLY 1 APPLICATION TOPICALLY 2 (TWO) TIMES DAILY. X 6 WKS, THEN DROP TO ONCE DAILY X 6 WKS, THEN 2X/WK   cyclobenzaprine  (FLEXERIL ) 5 MG tablet Take 1 tablet (5 mg total) by mouth at bedtime as needed for muscle spasms.   fluticasone  (FLONASE ) 50 MCG/ACT nasal spray Place 2 sprays into both nostrils daily.   hydrocortisone  2.5 % cream Apply topically 2 (two) times daily.   ibuprofen  (ADVIL ) 600 MG tablet Take 1 tablet (600 mg total) by mouth every 6 (six) hours as needed.   levothyroxine  (SYNTHROID ) 50 MCG tablet TAKE 1 TABLET BY MOUTH EVERY DAY   Multiple Vitamin (MULTIVITAMIN WITH  MINERALS) TABS tablet Take 1 tablet by mouth daily.   pregabalin  (LYRICA ) 75 MG capsule TAKE 1 CAPSULE BY MOUTH TWICE A DAY   traZODone  (DESYREL ) 50 MG tablet Take 0.5-1 tablets (25-50 mg total) by mouth at bedtime as needed.   VITAMIN D , CHOLECALCIFEROL, PO Take 1 tablet by mouth daily in the afternoon.   NON FORMULARY CBD GUMMIES -to help with knee pain   No facility-administered encounter medications on file as of 06/08/2024.     Allergies (verified) Patient has no known allergies.   History: Past Medical History:  Diagnosis Date   Anxiety    Anxiety and depression    Depression    Diverticulosis    GERD (gastroesophageal reflux disease)    Internal hemorrhoids    OA (osteoarthritis)    Obesity, Class III, BMI 40-49.9 (morbid obesity)    Osteopenia    Thyroid  disease    Tubular adenoma    Urinary incontinence    Vitamin D  deficiency    Past Surgical History:  Procedure Laterality Date   BREAST BIOPSY Right    COLONOSCOPY WITH PROPOFOL  N/A 07/07/2016   Procedure: COLONOSCOPY WITH PROPOFOL ;  Surgeon: Nannette Babe, MD;  Location: [facility];  Service: Gastroenterology;  Laterality: N/A;   ESOPHAGOGASTRODUODENOSCOPY (EGD) WITH PROPOFOL  N/A 07/07/2016   Procedure: ESOPHAGOGASTRODUODENOSCOPY (EGD) WITH PROPOFOL ;  Surgeon: Nannette Babe, MD;  Location: [facility];  Service: Gastroenterology;  Laterality: N/A;   HYSTEROSCOPY WITH D & C N/A 01/16/2015   Procedure: DILATATION AND CURETTAGE /HYSTEROSCOPY;  Surgeon: Granville Layer, MD;  Location: [facility];  Service: Gynecology;  Laterality: N/A;   HYSTEROSCOPY WITH D & C N/A 06/06/2019   Procedure: DILATATION AND CURETTAGE /HYSTEROSCOPY;  Surgeon: Heron Lord, MD;  Location: [facility] OR[facility];  Service: Gynecology;  Laterality: N/A;   JOINT REPLACEMENT     TOTAL KNEE ARTHROPLASTY  08/2008   Left   Family History  Problem Relation Age of Onset   Cancer Mother 81       breast   Breast cancer Mother 43   Cancer Father    Cancer Daughter 9       breast   Breast cancer Daughter 40   Colon cancer Neg Hx    Esophageal cancer Neg Hx    Rectal cancer Neg Hx    Stomach cancer Neg Hx    Social History   Socioeconomic History   Marital status: Widowed    Spouse name: Not on file   Number of children: 4   Years of education: Not on file   Highest education level: Not on file  Occupational History   Occupation: Science writer  Tobacco Use   Smoking  status: Never   Smokeless tobacco: Never  Vaping Use   Vaping status: Never Used  Substance and Sexual Activity   Alcohol use: No    Alcohol/week: 0.0 standard drinks of alcohol   Drug use: No   Sexual activity: Not Currently    Birth control/protection: Post-menopausal  Other Topics Concern   Not on file  Social History Narrative   Not on file   Social Drivers of Health   Financial Resource Strain: Low Risk  (06/08/2024)   Overall Financial Resource Strain (CARDIA)    Difficulty of Paying Living Expenses: Not hard at all  Food Insecurity: No Food Insecurity (06/08/2024)   Hunger Vital Sign    Worried About Running Out of Food in the Last Year: Never true    Ran Out of Food  in the Last Year: Never true  Transportation Needs: No Transportation Needs (06/08/2024)   PRAPARE - Administrator, Civil Service (Medical): No    Lack of Transportation (Non-Medical): No  Physical Activity: Inactive (06/08/2024)   Exercise Vital Sign    Days of Exercise per Week: 0 days    Minutes of Exercise per Session: 0 min  Stress: No Stress Concern Present (06/08/2024)   [employer] of Occupational Health - Occupational Stress Questionnaire    Feeling of Stress: Not at all  Social Connections: Socially Isolated (06/08/2024)   Social Connection and Isolation Panel    Frequency of Communication with Friends and Family: Twice a week    Frequency of Social Gatherings with Friends and Family: Once a week    Attends Religious Services: Never    Database administrator or Organizations: No    Attends Banker Meetings: Never    Marital Status: Widowed    Tobacco Counseling Counseling given: Not Answered    Clinical Intake:  Pre-visit preparation completed: Yes  Pain : 0-10 Pain Score: 5  Pain Type: Chronic pain Pain Location: Knee (left hip also) Pain Orientation: Right Pain Descriptors / Indicators: Aching Pain Onset: More than a month ago Pain Frequency:  Constant Pain Relieving Factors: ice, rest, tylenol, knee injection (did not work) Effect of Pain on Daily Activities: limits what walking  Pain Relieving Factors: ice, rest, tylenol, knee injection (did not work)  BMI - recorded: 49.12 Nutritional Status: BMI > 30  Obese Nutritional Risks: None Diabetes: No  Lab Results  Component Value Date   HGBA1C 5.2 10/25/2023   HGBA1C 5.8 10/14/2022   HGBA1C 5.8 12/17/2020     How often do you need to have someone help you when you read instructions, pamphlets, or other written materials from your doctor or pharmacy?: 1 - Never  Interpreter Needed?: No  Comments: daughter lives pt Information entered by :: B.Yanice Maqueda,LPN   Activities of Daily Living     06/08/2024   10:41 AM 03/03/2024   10:43 AM  In your present state of health, do you have any difficulty performing the following activities:  Hearing? 0 0  Vision? 1 0  Difficulty concentrating or making decisions? 0 0  Walking or climbing stairs? 1 1  Comment  does not climb stairs due to knee.  Dressing or bathing? 0 0  Doing errands, shopping? 1 0  Preparing Food and eating ? N   Using the Toilet? N   In the past six months, have you accidently leaked urine? N   Do you have problems with loss of bowel control? N   Managing your Medications? N   Managing your Finances? N   Housekeeping or managing your Housekeeping? N     Patient Care Team: Dorothe Gaster, NP as PCP - General  I have updated your Care Teams any recent Medical Services you may have received from other providers in the past year.     Assessment:   This is a routine wellness examination for [place].  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      Patient Stated   On track    06/08/24-Lose 40 pounds.       Depression Screen     06/08/2024   10:35 AM 03/03/2024   10:50 AM 10/21/2023    2:10 PM 06/08/2023   10:00 AM 03/30/2023   12:29 PM 09/30/2022    3:46 PM 06/05/2022    8:56 AM  PHQ 2/9 Scores  PHQ - 2 Score 0 0 1 0 4 2 0  PHQ- 9 Score  6 5  8 10 2     Fall Risk     06/08/2024   10:32 AM 03/03/2024   10:50 AM 10/21/2023    2:11 PM 06/08/2023   10:02 AM 03/30/2023   12:29 PM  Fall Risk   Falls in the past year? 0 0 0 0 0  Number falls in past yr: 0 0 0 0 0  Injury with Fall? 0 0 0 0 0  Risk for fall due to : No Fall Risks No Fall Risks No Fall Risks No Fall Risks No Fall Risks  Follow up Education provided;Falls prevention discussed Falls evaluation completed Falls evaluation completed Falls prevention discussed;Falls evaluation completed Falls evaluation completed    MEDICARE RISK AT HOME:  Medicare Risk at Home Any stairs in or around the home?: Yes If so, are there any without handrails?: Yes Home free of loose throw rugs in walkways, pet beds, electrical cords, etc?: Yes Adequate lighting in your home to reduce risk of falls?: Yes Life alert?: No Use of a cane, walker or w/c?: Yes Grab bars in the bathroom?: Yes Shower chair or bench in shower?: Yes Elevated toilet seat or a handicapped toilet?: No  TIMED UP AND GO:  Was the test performed?  No  Cognitive Function: 6CIT completed    06/04/2021    8:22 AM 05/14/2020    2:09 PM  MMSE - Mini Mental State Exam  Orientation to time 5 5  Orientation to Place 5 5  Registration 3 3  Attention/ Calculation 5 5  Recall 3 3  Language- repeat 1 1        06/08/2024   10:48 AM 06/08/2023   10:03 AM 06/05/2022    9:01 AM  6CIT Screen  What Year? 0 points 0 points   What month? 0 points 0 points 0 points  What time? 0 points 0 points 0 points  Count back from 20 0  points 0 points 0 points  Months in reverse 0 points 0 points 0 points  Repeat phrase 0 points 0 points 0 points  Total Score 0 points 0 points     Immunizations Immunization History  Administered Date(s) Administered   Fluad Quad(high Dose 65+) 08/29/2019, 09/05/2020, 09/17/2021, 09/30/2022   Influenza Split 10/13/2011   Influenza,inj,Quad PF,6+ Mos 09/19/2014, 10/14/2015, 08/28/2017, 10/17/2018   Influenza-Unspecified 10/24/2023   PFIZER(Purple Top)SARS-COV-2 Vaccination 09/29/2020, 12/09/2020, 10/24/2023   PNEUMOCOCCAL CONJUGATE-20 09/30/2022, 10/24/2023   Unspecified SARS-COV-2 Vaccination 10/24/2023    Screening Tests Health Maintenance  Topic Date Due   Hepatitis C Screening  Never done   DTaP/Tdap/Td (1 - Tdap) Never done   Zoster Vaccines- Shingrix (1 of 2) Never done   DEXA SCAN  04/08/2019   COVID-19 Vaccine (4 - 2024-25 season) 12/19/2023   INFLUENZA VACCINE  07/28/2024   Medicare Annual Wellness (AWV)  06/08/2025   MAMMOGRAM  03/03/2026   Colonoscopy  07/07/2026  Pneumococcal Vaccine: 50+ Years  Completed   HPV VACCINES  Aged Out   Meningococcal B Vaccine  Aged Out    Health Maintenance  Health Maintenance Due  Topic Date Due   Hepatitis C Screening  Never done   DTaP/Tdap/Td (1 - Tdap) Never done   Zoster Vaccines- Shingrix (1 of 2) Never done   DEXA SCAN  04/08/2019   COVID-19 Vaccine (4 - 2024-25 season) 12/19/2023   Health Maintenance Items Addressed: DEXA orderedalready placed Pt will get Shingles from her pharmacy when she decides to get  Additional Screening:  Vision Screening: Recommended annual ophthalmology exams for early detection of glaucoma and other disorders of the eye. Would you like a referral to an eye doctor? No    Dental Screening: Recommended annual dental exams for proper oral hygiene  Community Resource Referral / Chronic Care Management: CRR required this visit?  No   CCM required this visit?  No   Plan:    I have  personally reviewed and noted the following in the patient's chart:   Medical and social history Use of alcohol, tobacco or illicit drugs  Current medications and supplements including opioid prescriptions. Patient is not currently taking opioid prescriptions. Functional ability and status Nutritional status Physical activity Advanced directives List of other physicians Hospitalizations, surgeries, and ER visits in previous 12 months Vitals Screenings to include cognitive, depression, and falls Referrals and appointments  In addition, I have reviewed and discussed with patient certain preventive protocols, quality metrics, and best practice recommendations. A written personalized care plan for preventive services as well as general preventive health recommendations were provided to patient.   Nerissa Bannister, LPN   1/61/0960   After Visit Summary: (MyChart) Due to this being a telephonic visit, the after visit summary with patients personalized plan was offered to patient via MyChart   Notes: Nothing significant to report at this time.

## 2024-06-19 ENCOUNTER — Ambulatory Visit: Payer: PRIVATE HEALTH INSURANCE | Admitting: Internal Medicine

## 2024-06-23 ENCOUNTER — Other Ambulatory Visit: Payer: Self-pay | Admitting: Nurse Practitioner

## 2024-06-23 DIAGNOSIS — F341 Dysthymic disorder: Secondary | ICD-10-CM

## 2024-07-11 ENCOUNTER — Ambulatory Visit (INDEPENDENT_AMBULATORY_CARE_PROVIDER_SITE_OTHER): Admitting: Nurse Practitioner

## 2024-07-11 VITALS — BP 138/80 | HR 70 | Temp 97.8°F | Ht <= 58 in | Wt 231.6 lb

## 2024-07-11 DIAGNOSIS — K449 Diaphragmatic hernia without obstruction or gangrene: Secondary | ICD-10-CM

## 2024-07-11 DIAGNOSIS — F419 Anxiety disorder, unspecified: Secondary | ICD-10-CM

## 2024-07-11 DIAGNOSIS — F32A Depression, unspecified: Secondary | ICD-10-CM | POA: Diagnosis not present

## 2024-07-11 DIAGNOSIS — R1319 Other dysphagia: Secondary | ICD-10-CM | POA: Diagnosis not present

## 2024-07-11 DIAGNOSIS — L989 Disorder of the skin and subcutaneous tissue, unspecified: Secondary | ICD-10-CM | POA: Diagnosis not present

## 2024-07-11 DIAGNOSIS — Z91038 Other insect allergy status: Secondary | ICD-10-CM | POA: Diagnosis not present

## 2024-07-11 DIAGNOSIS — M8580 Other specified disorders of bone density and structure, unspecified site: Secondary | ICD-10-CM | POA: Diagnosis not present

## 2024-07-11 MED ORDER — OMEPRAZOLE 20 MG PO CPDR: 20.0000 mg | DELAYED_RELEASE_CAPSULE | Freq: Every day | ORAL | 1 refills | Status: DC

## 2024-07-11 MED ORDER — HYDROCORTISONE 2.5 % EX CREA: TOPICAL_CREAM | Freq: Two times a day (BID) | CUTANEOUS | 0 refills | Status: DC

## 2024-07-11 MED ORDER — METHYLPREDNISOLONE ACETATE 80 MG/ML IJ SUSP
80.0000 mg | Freq: Once | INTRAMUSCULAR | Status: AC
  Administered 2024-07-11: 80 mg via INTRAMUSCULAR

## 2024-07-11 MED ORDER — LEVOCETIRIZINE DIHYDROCHLORIDE 5 MG PO TABS: 5.0000 mg | ORAL_TABLET | Freq: Every evening | ORAL | 0 refills | Status: DC

## 2024-07-11 MED ORDER — MUPIROCIN 2 % EX OINT: 1.0000 | TOPICAL_OINTMENT | Freq: Two times a day (BID) | CUTANEOUS | 0 refills | Status: DC

## 2024-07-11 MED ORDER — ALPRAZOLAM 0.5 MG PO TABS
ORAL_TABLET | ORAL | 0 refills | Status: AC
Start: 2024-07-11 — End: ?

## 2024-07-11 NOTE — Assessment & Plan Note (Signed)
 Hx of the same. She is thinking it is playing a role with her dysphagia. Amb refer GI

## 2024-07-11 NOTE — Patient Instructions (Addendum)
 Nice to see you today We did give you a steroid in office I have sent in an allergy pill to take daily Also we will start omeprazole  (Prilosec) Follow up with me in 2 months, sooner if you need me    Call for bone density scan  [facility] [address] ( on hospital grounds) [place], [place]  [phone]

## 2024-07-11 NOTE — Assessment & Plan Note (Signed)
 Hx of the same with endoscopy and dilation. Amb refer to GI. Will trail omeprazole  20mg  daily for a month

## 2024-07-11 NOTE — Assessment & Plan Note (Signed)
 Blisters that are fluid filled and itchy, intermittent in nature. She can try the hydrocortisione 2.5% as needed. Info on dyshidrotic eczema given at discharge

## 2024-07-11 NOTE — Progress Notes (Signed)
 Acute Office Visit  Subjective:     Patient ID: Joan Russo, female    DOB: Mar 16, 1956, 68 y.o.   MRN: [id number]  Chief Complaint  Patient presents with   Insect Bite    Pt complains of ant attack on Sunday morning. Pt states she was bit multiple times. R hand is swollen and both hands are itchy.    Dysphagia    HPI Patient is in today for multiple complaints with history of HTN, OSA, DJD, polyarthralgia, fibromyalgia,   Bites: she was outside and got biten by ants. States that she went outside and saw a food wrapper and she picked it up and swarms of ants went up her arms. States that she thought she was bitten. Both hands swollen and now the right is swollen and itching. She has tired cold compresses, warm salt warm, oral benadryl .  Dyspagia: had an endoscopy on 07/09/2016 and had a tortuous esophagus with diverticulum and parital webs that required dilatation. She was told that she had a hitial hernia in the past. States that harder food like bisucit or meat will get stouck. States that she has trouble getting it up ro down. Sometimes she will have to spit it back up. States this has been bothering her for the past 6 months. States that it is gradually getting worse  Review of Systems  Constitutional:  Negative for chills and fever.  Respiratory:  Negative for shortness of breath.   Cardiovascular:  Negative for chest pain.  Neurological:  Negative for headaches.  Psychiatric/Behavioral:  Negative for hallucinations and suicidal ideas.         Objective:    BP 138/80   Pulse 70   Temp 97.8 F (36.6 C) (Oral)   Ht 4' 10 (1.473 m)   Wt 231 lb 9.6 oz (105.1 kg)   SpO2 98%   BMI 48.40 kg/m  BP Readings from Last 3 Encounters:  07/11/24 138/80  06/07/24 (!) 140/90  03/03/24 130/80   Wt Readings from Last 3 Encounters:  07/11/24 231 lb 9.6 oz (105.1 kg)  06/08/24 235 lb (106.6 kg)  06/07/24 235 lb 6.4 oz (106.8 kg)   SpO2 Readings from Last 3 Encounters:   07/11/24 98%  06/07/24 98%  03/03/24 98%      Physical Exam Vitals and nursing note reviewed.  Constitutional:      Appearance: Normal appearance.  Cardiovascular:     Rate and Rhythm: Normal rate and regular rhythm.     Heart sounds: Normal heart sounds.  Pulmonary:     Effort: Pulmonary effort is normal.     Breath sounds: Normal breath sounds.  Skin:    Findings: Erythema, lesion and rash present.  Neurological:     Mental Status: She is alert.     No results found for any visits on 07/11/24.      Assessment & Plan:   Problem List Items Addressed This Visit       Respiratory   Hiatal hernia   Hx of the same. She is thinking it is playing a role with her dysphagia. Amb refer GI         Digestive   Esophageal dysphagia   Hx of the same with endoscopy and dilation. Amb refer to GI. Will trail omeprazole  20mg  daily for a month       Relevant Medications   omeprazole  (PRILOSEC) 20 MG capsule   Other Relevant Orders   Ambulatory referral to Gastroenterology  Musculoskeletal and Integument   Skin lesion   Blisters that are fluid filled and itchy, intermittent in nature. She can try the hydrocortisione 2.5% as needed. 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 Xyzal  daily. Mupirocin  to the opens spots to prevent secondary infection. S/S reviewed when to be seen again       Relevant Medications   methylPREDNISolone  acetate (DEPO-MEDROL ) injection 80 mg   levocetirizine (XYZAL ) 5 MG tablet   mupirocin  ointment (BACTROBAN ) 2 %    Meds ordered this encounter  Medications   omeprazole  (PRILOSEC) 20 MG capsule    Sig: Take 1 capsule (20 mg total) by mouth daily.    Dispense:  30 capsule    Refill:  1    Supervising Provider:   RANDEEN HARDY A [[id number]]   methylPREDNISolone   acetate (DEPO-MEDROL ) injection 80 mg   levocetirizine (XYZAL ) 5 MG tablet    Sig: Take 1 tablet (5 mg total) by mouth every evening.    Dispense:  30 tablet    Refill:  0    Supervising Provider:   RANDEEN HARDY A [[id number]]   mupirocin  ointment (BACTROBAN ) 2 %    Sig: Apply 1 Application topically 2 (two) times daily.    Dispense:  22 g    Refill:  0    Supervising Provider:   RANDEEN HARDY A [[id number]]   hydrocortisone  2.5 % cream    Sig: Apply topically 2 (two) times daily.    Dispense:  30 g    Refill:  0    Supervising Provider:   RANDEEN HARDY A [[id number]]    Return in about 8 weeks (around 09/05/2024) for Fibro/sleep .  Adina Crandall, NP

## 2024-07-11 NOTE — Assessment & Plan Note (Signed)
 DepoMedrol 80mg  IM x 1 dose. Xyzal  daily. Mupirocin  to the opens spots to prevent secondary infection. S/S reviewed when to be seen again

## 2024-07-11 NOTE — Addendum Note (Signed)
 Addended by: WENDEE LYNWOOD HERO on: 07/11/2024 12:08 PM   Modules accepted: Orders

## 2024-07-14 ENCOUNTER — Encounter: Payer: Self-pay | Admitting: Advanced Practice Midwife

## 2024-08-02 ENCOUNTER — Other Ambulatory Visit: Payer: Self-pay | Admitting: Nurse Practitioner

## 2024-08-02 DIAGNOSIS — Z91038 Other insect allergy status: Secondary | ICD-10-CM

## 2024-08-02 DIAGNOSIS — R1319 Other dysphagia: Secondary | ICD-10-CM

## 2024-08-03 NOTE — Telephone Encounter (Signed)
 Can we call and see if the omeprazole  is helping. If so does she want to continue it

## 2024-08-07 ENCOUNTER — Encounter: Payer: Self-pay | Admitting: Nurse Practitioner

## 2024-08-07 NOTE — Telephone Encounter (Signed)
 Called patient she has had improvement with symptoms and would like to continue taking.

## 2024-08-18 DIAGNOSIS — G4733 Obstructive sleep apnea (adult) (pediatric): Secondary | ICD-10-CM | POA: Diagnosis not present

## 2024-08-31 ENCOUNTER — Other Ambulatory Visit: Payer: Self-pay | Admitting: Nurse Practitioner

## 2024-08-31 DIAGNOSIS — M797 Fibromyalgia: Secondary | ICD-10-CM

## 2024-09-06 ENCOUNTER — Ambulatory Visit: Admitting: Nurse Practitioner

## 2024-09-11 ENCOUNTER — Other Ambulatory Visit (INDEPENDENT_AMBULATORY_CARE_PROVIDER_SITE_OTHER)

## 2024-09-11 ENCOUNTER — Encounter: Payer: Self-pay | Admitting: Nurse Practitioner

## 2024-09-11 ENCOUNTER — Ambulatory Visit (INDEPENDENT_AMBULATORY_CARE_PROVIDER_SITE_OTHER): Admitting: Nurse Practitioner

## 2024-09-11 VITALS — BP 124/66 | HR 79 | Ht <= 58 in | Wt 234.0 lb

## 2024-09-11 DIAGNOSIS — Z860102 Personal history of hyperplastic colon polyps: Secondary | ICD-10-CM

## 2024-09-11 DIAGNOSIS — Z860101 Personal history of adenomatous and serrated colon polyps: Secondary | ICD-10-CM | POA: Diagnosis not present

## 2024-09-11 DIAGNOSIS — Z860100 Personal history of colon polyps, unspecified: Secondary | ICD-10-CM

## 2024-09-11 DIAGNOSIS — R1310 Dysphagia, unspecified: Secondary | ICD-10-CM

## 2024-09-11 LAB — COMPREHENSIVE METABOLIC PANEL WITH GFR
ALT: 13 U/L (ref 0–35)
AST: 18 U/L (ref 0–37)
Albumin: 4.5 g/dL (ref 3.5–5.2)
Alkaline Phosphatase: 73 U/L (ref 39–117)
BUN: 16 mg/dL (ref 6–23)
CO2: 27 meq/L (ref 19–32)
Calcium: 10 mg/dL (ref 8.4–10.5)
Chloride: 105 meq/L (ref 96–112)
Creatinine, Ser: 0.87 mg/dL (ref 0.40–1.20)
GFR: 67.5 mL/min (ref >60.00)
Glucose, Bld: 84 mg/dL (ref 70–99)
Potassium: 3.8 meq/L (ref 3.5–5.1)
Sodium: 142 meq/L (ref 135–145)
Total Bilirubin: 0.4 mg/dL (ref 0.2–1.2)
Total Protein: 7.4 g/dL (ref 6.0–8.3)

## 2024-09-11 LAB — CBC WITH DIFFERENTIAL/PLATELET
Basophils Absolute: 0.2 K/uL — ABNORMAL HIGH (ref 0.0–0.1)
Basophils Relative: 1.4 % (ref 0.0–3.0)
Eosinophils Absolute: 0.3 K/uL (ref 0.0–0.7)
Eosinophils Relative: 2.3 % (ref 0.0–5.0)
HCT: 43.8 % (ref 36.0–46.0)
Hemoglobin: 14.3 g/dL (ref 12.0–15.0)
Lymphocytes Relative: 45.6 % (ref 12.0–46.0)
Lymphs Abs: 5.1 K/uL — ABNORMAL HIGH (ref 0.7–4.0)
MCHC: 32.8 g/dL (ref 30.0–36.0)
MCV: 88.6 fl (ref 78.0–100.0)
Monocytes Absolute: 0.7 K/uL (ref 0.1–1.0)
Monocytes Relative: 6.2 % (ref 3.0–12.0)
Neutro Abs: 5 K/uL (ref 1.4–7.7)
Neutrophils Relative %: 44.5 % (ref 43.0–77.0)
Platelets: 272 K/uL (ref 150.0–400.0)
RBC: 4.94 Mil/uL (ref 3.87–5.11)
RDW: 14.7 % (ref 11.5–15.5)
WBC: 11.2 K/uL — ABNORMAL HIGH (ref 4.0–10.5)

## 2024-09-11 NOTE — Progress Notes (Signed)
 09/11/2024 Joan Russo [id number] October 10, 1956   CHIEF COMPLAINT: Difficulty swallowing  HISTORY OF PRESENT ILLNESS: Joan Russo is a 68 year old female with a past medical history of anxiety, depression, osteoarthritis, fibromyalgia, obesity, OSA uses CPAP, Covid pneumonia 01/2020, hypothyroidism and vitamin D  deficiency. She presents to our office today as referred by Lynwood Crandall NP for further evaluation regarding dysphagia. She is known by Dr. Albertus. She endorses having dense food such as meat and biscuits which get stuck in the mid esophagus and she vomits out the stuck food which recurred 3 to 4 months ago and has progressively worsened since then. No heartburn. She noted having increased burping about 6 months ago for which she was prescribed Omeprazole  20mg  once daily with improvement. Prior history of dysphagia and esophageal dysmotility.  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  She underwent an EGD 06/2016 which showed a torturous esophagus with diverticulum suggestive of dysmotility and partial webs in the lower third of the esophagus with esophageal dilatation. An esophageal manometry was scheduled 07/22/2016 which the patient subsequently canceled. She is passing normal brown stools most days. She has occasional constipation which resolves after she increased the fiber in her diet, she tries to avoid taking any laxative. No bloody or black stools. No abdominal pain. Her most recent colonoscopy was 06/2016 which showed diverticulosis in the sigmoid, descending and ascending colon and internal hemorrhoids. She attempted to do a Cologuard test October and December 2023 as ordered by her PCP, however, these test were incomplete due to collection errors. IFBOT negative 05/10/2024.     Latest Ref Rng & Units 10/25/2023     8:38 AM 10/14/2022   11:59 AM 07/17/2022    5:40 PM  CBC  WBC 4.0 - 10.5 K/uL 9.8  9.4  15.1   Hemoglobin 12.0 - 15.0 g/dL 86.1  85.2  82.6   Hematocrit 36.0 - 46.0 % 43.1  44.3  53.9   Platelets 150.0 - 400.0 K/uL 270.0  262.0  180        Latest Ref Rng & Units 10/25/2023    8:38 AM 10/14/2022   11:59 AM 07/17/2022    5:40 PM  CMP  Glucose 70 - 99 mg/dL 96  85  880   BUN 6 - 23 mg/dL 20  12  16    Creatinine 0.40 - 1.20 mg/dL 8.97  9.17  8.94   Sodium 135 - 145 mEq/L 143  143  135   Potassium 3.5 - 5.1 mEq/L 4.1  4.2  3.2   Chloride 96 - 112 mEq/L 107  106  103   CO2 19 - 32 mEq/L 26  32  20   Calcium 8.4 - 10.5 mg/dL 9.3  9.8  9.3   Total Protein 6.0 - 8.3 g/dL 6.5  7.0  7.8   Total Bilirubin 0.2 - 1.2 mg/dL 0.5  0.7  0.8   Alkaline Phos 39 - 117 U/L 81  69  85   AST 0 - 37 U/L 14  18  39   ALT 0 - 35 U/L 13  16  27      ECHO 05/15/2022: Left ventricular ejection fraction, by estimation, is 60 to 65%. The left ventricle has normal function. The left ventricle has no regional wall motion abnormalities. Left ventricular diastolic parameters were normal. The average left  ventricular global longitudinal strain is -24.0 %. The global longitudinal strain is normal. 1. 2. Right ventricular systolic function is normal. The right ventricular size is normal. The mitral valve is normal in structure. Trivial mitral valve regurgitation. No evidence of mitral stenosis. 3. The aortic valve has an indeterminant number of cusps. Aortic valve regurgitation is not visualized. No aortic stenosis is present. 4. The inferior vena cava is normal in size with greater than 50% respiratory variability, suggesting right atrial pressure of 3 mmHg.  Barium swallow study 06/11/2016: FINDINGS: Initial evaluation of the hypopharynx and cervical esophagus appeared normal without mass, ulceration or other focal wall irregularity. Thick and thin consistency barium contrast material moved promptly through the  hypopharynx and cervical esophagus without evidence of obstruction or dysmotility.   Proximal thoracic esophagus appeared normal in caliber and configuration without mass, ulceration or other focal wall irregularity. Thin consistency barium contrast material moved promptly through the upper thoracic esophagus without evidence of obstruction or dysmotility.   With Valsalva maneuvers, a small sliding hiatal hernia was identified. Just above the hiatal hernia, a persistent smooth circumferential narrowing of the lower esophagus was identified suggesting Schatzki's ring or other stricture. A 13 mm barium tablet would not pass this narrowing. During the exam, there was development of tertiary contractions and to and fro flow of contrast material within the mid and lower thoracic esophagus indicating associated secondary dysmotility.   IMPRESSION: 1. Small hiatal hernia. Just above the hiatal hernia, a persistent smooth circumferential narrowing of the lower esophagus suggesting Schatzki's ring or other benign stricture. Neoplastic narrowing is felt to be less likely. 13 mm barium tablet would not pass through this narrowing. Recommend endoscopic evaluation to ensure benignity and for consideration of therapeutic dilatation. 2. Evidence of secondary dysmotility within the mid and lower thoracic esophagus, with associated tertiary contractions and to and fro flow of contrast material throughout the exam. 3. Hypopharynx, cervical esophagus and upper thoracic esophagus appeared normal.   GI PROCEDURES:  EGD  07/07/2016 at [facility]  by Dr. Albertus.  - Tortuous esophagus with diverticulum suggestive of dysmotility.  - Parital webs in the lower third of the esophagus. Dilated.  - Normal stomach.  - Normal examined duodenum.  Colonoscopy 07/07/2016:  - Mild diverticulosis in the sigmoid colon, in the descending colon and in the ascending colon.  - Internal hemorrhoids.  - The examination was  otherwise normal.  - No specimens collected. - 5 year recall colonoscopy secondary to prior history of colon polyps   Colonoscopy 01/27/2012:  1. Surgical [P], hepatic flexure, polyps (2) - TUBULAR ADENOMA (ONE FRAGMENT). - SESSILE SERRATED ADENOMA (ONE FRAGMENT) - BENIGN COLORECTAL MUCOSA ASSOCIATED WITH A BENIGN LYMPHOID AGGREGATE (ONE FRAGMENT). - NO HIGH GRADE DYSPLASIA OR MALIGNANCY IDENTIFIED. 2. Surgical [P], rectosigmoid, polyps (2) - PROLAPSE-TYPE POLYP (ONE FRAGMENT). - HYPERPLASTIC POLYP (REMAINING FRAGMENTS). - NO DYSPLASIA OR MALIGNANCY IDENTIFIED  Past Medical History:  Diagnosis Date   Anxiety    Anxiety and depression    Depression    Diverticulosis    GERD (gastroesophageal reflux disease)    Internal hemorrhoids    OA (osteoarthritis)    Obesity, Class III, BMI 40-49.9 (morbid obesity)    Osteopenia    Thyroid  disease    Tubular adenoma    Urinary incontinence    Vitamin D  deficiency    Past Surgical History:  Procedure Laterality Date   BREAST BIOPSY Right    COLONOSCOPY WITH PROPOFOL  N/A 07/07/2016   Procedure: COLONOSCOPY WITH PROPOFOL ;  Surgeon: Gordy CHRISTELLA Starch, MD;  Location: [facility];  Service: Gastroenterology;  Laterality: N/A;   ESOPHAGOGASTRODUODENOSCOPY (EGD) WITH PROPOFOL  N/A 07/07/2016   Procedure: ESOPHAGOGASTRODUODENOSCOPY (EGD) WITH PROPOFOL ;  Surgeon: Gordy CHRISTELLA Starch, MD;  Location: [facility];  Service: Gastroenterology;  Laterality: N/A;   HYSTEROSCOPY WITH D & C N/A 01/16/2015   Procedure: DILATATION AND CURETTAGE /HYSTEROSCOPY;  Surgeon: Glenys GORMAN Birk, MD;  Location: [facility];  Service: Gynecology;  Laterality: N/A;   HYSTEROSCOPY WITH D & C N/A 06/06/2019   Procedure: DILATATION AND CURETTAGE /HYSTEROSCOPY;  Surgeon: Victor Claudell SAUNDERS, MD;  Location: [facility];  Service: Gynecology;  Laterality: N/A;   JOINT REPLACEMENT     TOTAL KNEE ARTHROPLASTY  08/2008   Left   Social History: Nonsmoker. No alcohol use. No drug use.   Family  History: Daughter and mother died from breast cancer.   No Known Allergies   Outpatient Encounter Medications as of 09/11/2024  Medication Sig   ALPRAZolam  (XANAX ) 0.5 MG tablet TAKE 1 TABLET BY MOUTH AT BEDTIME AS NEEDED FOR ANXIETY.   buPROPion  (WELLBUTRIN  XL) 300 MG 24 hr tablet TAKE 1 TABLET BY MOUTH EVERY DAY   clobetasol  cream (TEMOVATE ) 0.05 % APPLY 1 APPLICATION TOPICALLY 2 (TWO) TIMES DAILY. X 6 WKS, THEN DROP TO ONCE DAILY X 6 WKS, THEN 2X/WK   cyclobenzaprine  (FLEXERIL ) 5 MG tablet Take 1 tablet (5 mg total) by mouth at bedtime as needed for muscle spasms.   fluticasone  (FLONASE ) 50 MCG/ACT nasal spray Place 2 sprays into both nostrils daily.   hydrocortisone  2.5 % cream Apply topically 2 (two) times daily.   ibuprofen  (ADVIL ) 600 MG tablet Take 1 tablet (600 mg total) by mouth every 6 (six) hours as needed.   levocetirizine (XYZAL ) 5 MG tablet TAKE 1 TABLET BY MOUTH EVERY DAY IN THE EVENING   levothyroxine  (SYNTHROID ) 50 MCG tablet TAKE 1 TABLET BY MOUTH EVERY DAY   Multiple Vitamin (MULTIVITAMIN WITH MINERALS) TABS tablet Take 1 tablet by mouth daily.   mupirocin  ointment (BACTROBAN ) 2 % Apply 1 Application topically 2 (two) times daily.   NON FORMULARY CBD GUMMIES -to help with knee pain   omeprazole  (PRILOSEC) 20 MG capsule TAKE 1 CAPSULE BY MOUTH EVERY DAY   pregabalin  (LYRICA ) 75 MG capsule TAKE 1 CAPSULE BY MOUTH TWICE A DAY   traZODone  (DESYREL ) 50 MG tablet Take 0.5-1 tablets (25-50 mg total) by mouth at bedtime as needed.   VITAMIN D , CHOLECALCIFEROL, PO Take 1 tablet by mouth daily in the afternoon.   No facility-administered encounter medications on file as of 09/11/2024.   REVIEW OF SYSTEMS:  Gen: Denies fever, sweats or chills. No weight loss.  CV: Denies chest pain, palpitations or edema. Resp: Denies cough, shortness of breath of hemoptysis.  GI: Denies heartburn, dysphagia, stomach or lower abdominal pain. No diarrhea or constipation.  GU: Denies urinary  burning, blood in urine, increased urinary frequency or incontinence. MS: Denies joint pain, muscles aches or weakness. Derm: Denies rash, itchiness, skin lesions or unhealing ulcers. Psych: Denies depression, anxiety, memory loss or confusion. Heme: Denies bruising, easy bleeding. Neuro:  Denies headaches, dizziness or paresthesias. Endo:  Denies any problems with DM, thyroid  or adrenal function.  PHYSICAL EXAM: There were no vitals taken for this visit. BP 124/66   Pulse 79   Ht 4' 10 (1.473 m)   Wt 234 lb (106.1 kg)   BMI 48.91 kg/m   Wt Readings from Last 3 Encounters:  09/11/24 234 lb (106.1  kg)  07/11/24 231 lb 9.6 oz (105.1 kg)  06/08/24 235 lb (106.6 kg)    General: 68 year old female in no acute distress. Head: Normocephalic and atraumatic. Eyes:  Sclerae non-icteric, conjunctive pink. Ears: Normal auditory acuity. Mouth: Dentition intact. No ulcers or lesions.  Neck: Supple, no lymphadenopathy or thyromegaly.  Lungs: Clear bilaterally to auscultation without wheezes, crackles or rhonchi. Heart: Regular rate and rhythm. No murmur, rub or gallop appreciated.  Abdomen: Soft, obese abdomen.  Nontender, nondistended. No masses. No hepatosplenomegaly. Normoactive bowel sounds x 4 quadrants.  Rectal: Deferred.  Musculoskeletal: Symmetrical with no gross deformities. Skin: Warm and dry. No rash or lesions on visible extremities. Extremities: No edema. Neurological: Alert oriented x 4, no focal deficits.  Psychological: Alert and cooperative. Normal mood and affect.  ASSESSMENT AND PLAN:  68 year old female with a history of GERD and esophageal dysmotility presenting with recurrent esophageal dysphagia x 3 to 4 months with increased burping x 6 months on Omeprazole  20 mg daily. 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  She underwent an EGD 06/2016 which showed a torturous esophagus with diverticulum suggestive of dysmotility and partial webs in the lower third of the esophagus with esophageal dilatation.  - EGD with possible esophageal dilatation and LEC (BMI < 50) benefits and risks discussed including risk with sedation, risk of bleeding, perforation and infection  - Patient instructed to avoid eating large pieces of meat, bread or rice - Continue Omeprazole  20 mg once daily - Consider esophageal manometry, await EGD results - CBC, CMP  Prior history of tubular adenomatous/sessile serrated polyps per colonoscopy 12/2011.  Colonoscopy 06/2016 showed diverticulosis to the descending, sigmoid and ascending colon, no polyps. -I discussed scheduling a future colon polyp surveillance colonoscopy, patient does not wish to pursue now but will reconsider after January 2026 - Dr. Albertus to verify colonoscopy recall date         CC:  Joan Lynwood HERO, NP

## 2024-09-11 NOTE — Patient Instructions (Addendum)
 You will be due for a recall colonoscopy in Jan 2026. We will send you a reminder in the mail when it gets closer to that time.  Continue Omeprazole  20 mg once daily 30 min before breakfast.    You have been scheduled for an endoscopy. Please follow written instructions given to you at your visit today.  If you use inhalers (even only as needed), please bring them with you on the day of your procedure.  If you take any of the following medications, they will need to be adjusted prior to your procedure:   DO NOT TAKE 7 DAYS PRIOR TO TEST- Trulicity (dulaglutide) Ozempic, Wegovy (semaglutide) Mounjaro (tirzepatide) Bydureon Bcise (exanatide extended release)  DO NOT TAKE 1 DAY PRIOR TO YOUR TEST Rybelsus (semaglutide) Adlyxin (lixisenatide) Victoza (liraglutide) Byetta (exanatide) ___________________________________________________________________________   Due to recent changes in healthcare laws, you may see the results of your imaging and laboratory studies on MyChart before your provider has had a chance to review them.  We understand that in some cases there may be results that are confusing or concerning to you. Not all laboratory results come back in the same time frame and the provider may be waiting for multiple results in order to interpret others.  Please give us  48 hours in order for your provider to thoroughly review all the results before contacting the office for clarification of your results.   Thank you for trusting me with your gastrointestinal care!   Elida Shawl, CRNP

## 2024-09-13 ENCOUNTER — Ambulatory Visit: Payer: Self-pay | Admitting: Nurse Practitioner

## 2024-10-01 ENCOUNTER — Other Ambulatory Visit: Payer: Self-pay | Admitting: Nurse Practitioner

## 2024-10-01 DIAGNOSIS — E039 Hypothyroidism, unspecified: Secondary | ICD-10-CM

## 2024-10-09 ENCOUNTER — Telehealth: Payer: Self-pay | Admitting: Internal Medicine

## 2024-10-09 NOTE — Telephone Encounter (Signed)
 Inbound call from patient stating she has a EGD procedure for tomorrow 10/10/24 and would like to know if her insurance is going to cover it. Requesting a call back  Please advise Thank you

## 2024-10-10 ENCOUNTER — Ambulatory Visit (AMBULATORY_SURGERY_CENTER): Admitting: Internal Medicine

## 2024-10-10 ENCOUNTER — Encounter: Payer: Self-pay | Admitting: Internal Medicine

## 2024-10-10 VITALS — BP 140/70 | HR 68 | Temp 97.4°F | Resp 17 | Ht <= 58 in | Wt 234.6 lb

## 2024-10-10 DIAGNOSIS — E039 Hypothyroidism, unspecified: Secondary | ICD-10-CM | POA: Diagnosis not present

## 2024-10-10 DIAGNOSIS — R1310 Dysphagia, unspecified: Secondary | ICD-10-CM | POA: Diagnosis not present

## 2024-10-10 DIAGNOSIS — K219 Gastro-esophageal reflux disease without esophagitis: Secondary | ICD-10-CM

## 2024-10-10 DIAGNOSIS — G4733 Obstructive sleep apnea (adult) (pediatric): Secondary | ICD-10-CM | POA: Diagnosis not present

## 2024-10-10 DIAGNOSIS — K222 Esophageal obstruction: Secondary | ICD-10-CM

## 2024-10-10 DIAGNOSIS — R1319 Other dysphagia: Secondary | ICD-10-CM

## 2024-10-10 DIAGNOSIS — I10 Essential (primary) hypertension: Secondary | ICD-10-CM | POA: Diagnosis not present

## 2024-10-10 MED ORDER — SODIUM CHLORIDE 0.9 % IV SOLN: 500.0000 mL | Freq: Once | INTRAVENOUS | Status: DC

## 2024-10-10 NOTE — Op Note (Signed)
 [facility] Patient Name: Joan Russo Procedure Date: 10/10/2024 3:44 PM MRN: [id number] Endoscopist: Gordy CHRISTELLA Starch , MD, [id number] Age: 68 Referring MD:  Date of Birth: 1956-07-26 Gender: Female Account #: [id number] Procedure:                Upper GI endoscopy Indications:              Dysphagia, Gastro-esophageal reflux disease,                            Stricture of the esophagus; dilation in 2017 with                            improvement in dysphagia symptom at that time Medicines:                Monitored Anesthesia Care Procedure:                Pre-Anesthesia Assessment:                           - Prior to the procedure, a History and Physical                            was performed, and patient medications and                            allergies were reviewed. The patient's tolerance of                            previous anesthesia was also reviewed. The risks                            and benefits of the procedure and the sedation                            options and risks were discussed with the patient.                            All questions were answered, and informed consent                            was obtained. Prior Anticoagulants: The patient has                            taken no anticoagulant or antiplatelet agents. ASA                            Grade Assessment: III - A patient with severe                            systemic disease. After reviewing the risks and                            benefits, the patient was deemed in satisfactory  condition to undergo the procedure.                           After obtaining informed consent, the endoscope was                            passed under direct vision. Throughout the                            procedure, the patient's blood pressure, pulse, and                            oxygen saturations were monitored continuously. The                             Olympus Scope SN [id number] was introduced through the                            mouth, and advanced to the second part of duodenum.                            The upper GI endoscopy was accomplished without                            difficulty. The patient tolerated the procedure                            well. Scope In: Scope Out: Findings:                 Three benign-appearing, intrinsic moderate                            (circumferential scarring or stenosis; an endoscope                            may pass) stenoses were found in the lower third of                            the esophagus. The narrowest stenosis measured 1.3                            cm (inner diameter). The stenoses were traversed. A                            TTS dilator was passed through the scope. Dilation                            with a 15-16.5-18 mm balloon dilator was performed                            to 18 mm. The dilation site was examined and showed  mild mucosal disruption.                           The entire examined stomach was normal.                           The examined duodenum was normal. Complications:            No immediate complications. Estimated Blood Loss:     Estimated blood loss was minimal. Impression:               - Benign-appearing esophageal stenoses. Dilated to                            18 mm with balloon.                           - Normal stomach.                           - Normal examined duodenum.                           - No specimens collected. Recommendation:           - Patient has a contact number available for                            emergencies. The signs and symptoms of potential                            delayed complications were discussed with the                            patient. Return to normal activities tomorrow.                            Written discharge instructions were provided to the                             patient.                           - Post-dilation diet and then advance diet as                            tolerated.                           - Continue present medications.                           - If dysphagia (trouble swallowing) symptom                            persists post-dilation please contact our office  for follow-up visit. Gordy CHRISTELLA Starch, MD 10/10/2024 4:11:20 PM This report has been signed electronically.

## 2024-10-10 NOTE — Progress Notes (Signed)
 To PACU via stretcher, sedated, good respiratory effort, VSS.

## 2024-10-10 NOTE — Progress Notes (Unsigned)
 See office note dated 09/11/2024 for details and current H&P  Patient with history of GERD, esophageal dysmotility presenting with recurrent esophageal dysphagia previous history of distal esophageal stricture/Schatzki's ring.  She is on omeprazole  20 mg daily  She remains appropriate for upper endoscopy with possible dilation in the LEC.

## 2024-10-10 NOTE — Patient Instructions (Addendum)
 See post dilation diet handout, clear liquids until 530 pm today, then soft foods until tomorrow Continue present medications If difficulty swallowing persists, contact Dr Pamula office for a follow up visit.  Handouts/information given for post dilation diet, esophageal stricture(stenosis) and dysphagia  YOU HAD AN ENDOSCOPIC PROCEDURE TODAY AT THE [facility]:   Refer to the procedure report that was given to you for any specific questions about what was found during the examination.  If the procedure report does not answer your questions, please call your gastroenterologist to clarify.  If you requested that your care partner not be given the details of your procedure findings, then the procedure report has been included in a sealed envelope for you to review at your convenience later.  YOU SHOULD EXPECT: Some feelings of bloating in the abdomen. Passage of more gas than usual.  Walking can help get rid of the air that was put into your GI tract during the procedure and reduce the bloating. If you had a lower endoscopy (such as a colonoscopy or flexible sigmoidoscopy) you may notice spotting of blood in your stool or on the toilet paper. If you underwent a bowel prep for your procedure, you may not have a normal bowel movement for a few days.  Please Note:  You might notice some irritation and congestion in your nose or some drainage.  This is from the oxygen used during your procedure.  There is no need for concern and it should clear up in a day or so.  SYMPTOMS TO REPORT IMMEDIATELY:  Following upper endoscopy (EGD)  Vomiting of blood or coffee ground material  New chest pain or pain under the shoulder blades  Painful or persistently difficult swallowing  New shortness of breath  Fever of 100F or higher  Black, tarry-looking stools For urgent or emergent issues, a gastroenterologist can be reached at any hour by calling [phone]. Do not use MyChart messaging for  urgent concerns.   DIET:  We do recommend a POST DILATION DIET TODAY THEN TOMORROW, you may proceed to your regular diet as tolerated, chew foods well and swall  Drink plenty of fluids but you should avoid alcoholic beverages for 24 hours.  ACTIVITY:  You should plan to take it easy for the rest of today and you should NOT DRIVE or use heavy machinery until tomorrow (because of the sedation medicines used during the test).    FOLLOW UP: Our staff will call the number listed on your records the next business day following your procedure.  We will call around 7:15- 8:00 am to check on you and address any questions or concerns that you may have regarding the information given to you following your procedure. If we do not reach you, we will leave a message.     If any biopsies were taken you will be contacted by phone or by letter within the next 1-3 weeks.  Please call us  at [phone] if you have not heard about the biopsies in 3 weeks.    SIGNATURES/CONFIDENTIALITY: You and/or your care partner have signed paperwork which will be entered into your electronic medical record.  These signatures attest to the fact that that the information above on your After Visit Summary has been reviewed and is understood.  Full responsibility of the confidentiality of this discharge information lies with you and/or your care-partner.

## 2024-10-11 ENCOUNTER — Ambulatory Visit: Admitting: Nurse Practitioner

## 2024-10-11 ENCOUNTER — Telehealth: Payer: Self-pay

## 2024-10-11 DIAGNOSIS — G4733 Obstructive sleep apnea (adult) (pediatric): Secondary | ICD-10-CM

## 2024-10-11 DIAGNOSIS — G479 Sleep disorder, unspecified: Secondary | ICD-10-CM | POA: Diagnosis not present

## 2024-10-11 MED ORDER — ZEPBOUND 2.5 MG/0.5ML ~~LOC~~ SOAJ: 2.5000 mg | SUBCUTANEOUS | 0 refills | Status: DC

## 2024-10-11 NOTE — Patient Instructions (Signed)
 Nice to see you today  I want to see you in 3 months, sooner if you need me  We can discontinue the trazodone 

## 2024-10-11 NOTE — Telephone Encounter (Signed)
  Follow up Call-     10/10/2024    2:41 PM  Call back number  Post procedure Call Back phone  # [phone]  Permission to leave phone message Yes     Patient questions:  Do you have a fever, pain , or abdominal swelling? No. Pain Score  0 *  Have you tolerated food without any problems? Yes  Have you been able to return to your normal activities? Yes  Do you have any questions about your discharge instructions: Diet   No. Medications  No. Follow up visit  No.  Do you have questions or concerns about your Care? No.  Actions: * If pain score is 4 or above: No action needed, pain <4.

## 2024-10-11 NOTE — Progress Notes (Addendum)
 Established Patient Office Visit  Subjective   Patient ID: Joan Russo, female    DOB: 10-31-1956  Age: 68 y.o. MRN: [id number]  Chief Complaint  Patient presents with   Follow-up    Pt complains of Trazodone  not helping. States he sleep has not gotten better.    Medication Management    Pt would like to start on Zepbound. States her insurance will cover with these codes: [place] [id number], PCN- [id number], Group [id number]    HPI  Discussed the use of AI scribe software for clinical note transcription with the patient, who gave verbal consent to proceed.  History of Present Illness Joan Russo is a 68 year old female with sleep apnea who presents with sleep disturbances and weight management concerns.  She experiences ongoing sleep disturbances despite using trazodone  and CPAP therapy. Initially, she started trazodone  at half a tablet, then increased to a full tablet, but found it ineffective. Frequent awakenings are attributed to her CPAP mask sliding off due to fine hair and frequent tossing and turning. She wakes up multiple times a night to adjust the mask, disrupting her sleep. Her typical sleep schedule is from 10 PM to 5:30 AM, but it is interrupted. She recently obtained a different type of CPAP mask, but it continues to slide off. Despite trying various methods, such as tying her hair back, she has not found a solution to keep the mask in place.  She is considering weight management options to potentially alleviate her sleep apnea symptoms. Her dietary habits are irregular, often skipping breakfast and sometimes not eating until late afternoon. She typically consumes two meals a day, with lunch around 2-4 PM and dinner around 7 PM. Her diet includes water and Diet Coke, with occasional milk, and she does not consume high-calorie beverages. She does not eat out often.  She experiences knee pain in both knees, which limits her physical activity. She reports that orthopedists have  discussed knee replacement with her. The knee pain has been longstanding in one knee and has recently worsened in the other.  She recently underwent a procedure for a throat issue, which involved stretching, but she was asleep during the procedure and is unsure of the details.     Review of Systems  Constitutional:  Negative for chills and fever.  Respiratory:  Negative for shortness of breath.   Cardiovascular:  Negative for chest pain.  Musculoskeletal:  Positive for joint pain.  Neurological:  Negative for headaches.  Psychiatric/Behavioral:  Negative for hallucinations and suicidal ideas. The patient does not have insomnia.       Objective:     BP 112/68   Pulse 66   Temp 98.4 F (36.9 C) (Oral)   Ht 4' 10 (1.473 m)   Wt 232 lb 9.6 oz (105.5 kg)   SpO2 95%   BMI 48.61 kg/m  BP Readings from Last 3 Encounters:  10/11/24 112/68  10/10/24 (!) 140/70  09/11/24 124/66   Wt Readings from Last 3 Encounters:  10/11/24 232 lb 9.6 oz (105.5 kg)  10/10/24 234 lb 9.6 oz (106.4 kg)  09/11/24 234 lb (106.1 kg)      Physical Exam Vitals and nursing note reviewed.  Constitutional:      Appearance: Normal appearance.  Cardiovascular:     Rate and Rhythm: Normal rate and regular rhythm.     Heart sounds: Normal heart sounds.  Pulmonary:     Effort: Pulmonary effort is normal.     Breath sounds:  Normal breath sounds.  Neurological:     Mental Status: She is alert.      No results found for any visits on 10/11/24.    The 10-year ASCVD risk score (Arnett DK, et al., 2019) is: 7.3%    Assessment & Plan:   Problem List Items Addressed This Visit       Respiratory   OSA on CPAP     Other   Sleep disturbance   Other Visit Diagnoses       Morbid obesity (HCC)    -  Primary   Relevant Medications   tirzepatide (ZEPBOUND) 2.5 MG/0.5ML Pen      Assessment and Plan Assessment & Plan Obstructive sleep apnea Frequent awakenings due to CPAP mask issues.  Weight loss discussed as a potential long-term solution. - Discuss CPAP mask issues with pulmonology for potential solutions. - Consider weight loss as a long-term goal to potentially eliminate the need for CPAP.  Morbid Obesity Morbid Obesity contributing to obstructive sleep apnea. Discussed Zepbound for weight loss. Explained side effects and importance of regular meals and fluid intake. She qualifies for Zepbound due to BMI and sleep apnea. - Order Zepbound with special insurance codes. - Schedule follow-up in 3 months to assess tolerance and effectiveness of Zepbound. - Discuss dietary habits and encourage regular meals. - Encourage fluid intake to manage potential constipation from Zepbound. - unable to exercise currently due to orthopedic issues  Bilateral knee osteoarthritis Significant discomfort due to osteoarthritis. Weight loss discussed to reduce joint pressure and improve symptoms. - Encourage weight loss to reduce joint pressure and improve knee symptoms.  Return in about 3 months (around 01/11/2025) for Zepbound/weight .    Adina Crandall, NP

## 2024-10-24 ENCOUNTER — Telehealth: Payer: Self-pay | Admitting: Nurse Practitioner

## 2024-10-24 NOTE — Telephone Encounter (Signed)
 Copied from CRM [id number]. Topic: Clinical - Medication Prior Auth >> Oct 24, 2024  9:02 AM Eva FALCON wrote: Reason for CRM: Rachael from [facility] PA Department was calling in regards to PA for Zebound 2.5MG , states she wants to confirm if its for severe/moderate obstructive sleep apnea and/or obesity. Please call back [phone].

## 2024-10-24 NOTE — Telephone Encounter (Signed)
 Placing form in pcp box to fill out or can call [facility] with response.

## 2024-10-26 NOTE — Telephone Encounter (Signed)
Form has been completed and placed in the outgoing MA box

## 2024-10-26 NOTE — Telephone Encounter (Signed)
 Completed forms have been faxed to number provided on paperwork.

## 2024-10-30 ENCOUNTER — Other Ambulatory Visit: Payer: Self-pay | Admitting: Nurse Practitioner

## 2024-10-30 DIAGNOSIS — M797 Fibromyalgia: Secondary | ICD-10-CM

## 2024-11-22 ENCOUNTER — Other Ambulatory Visit: Payer: PRIVATE HEALTH INSURANCE

## 2024-11-24 ENCOUNTER — Other Ambulatory Visit: Payer: Self-pay | Admitting: Nurse Practitioner

## 2024-11-25 ENCOUNTER — Encounter: Payer: Self-pay | Admitting: Nurse Practitioner

## 2024-11-25 DIAGNOSIS — G4733 Obstructive sleep apnea (adult) (pediatric): Secondary | ICD-10-CM

## 2024-11-27 MED ORDER — ZEPBOUND 5 MG/0.5ML ~~LOC~~ SOAJ: 5.0000 mg | SUBCUTANEOUS | 0 refills | Status: DC

## 2024-12-19 ENCOUNTER — Telehealth: Payer: Self-pay

## 2024-12-19 MED ORDER — ZEPBOUND 7.5 MG/0.5ML ~~LOC~~ SOAJ: 7.5000 mg | SUBCUTANEOUS | 0 refills | Status: DC

## 2024-12-19 NOTE — Telephone Encounter (Signed)
 Please inform patient that I have sent in the zepbound  7.5mg  to the pharmacy on file

## 2024-12-19 NOTE — Telephone Encounter (Signed)
 Patient arrived in office requesting this message be addressed.

## 2024-12-19 NOTE — Telephone Encounter (Signed)
 See other note. This has already been handled

## 2024-12-19 NOTE — Addendum Note (Signed)
 Addended by: WENDEE LYNWOOD HERO on: 12/19/2024 04:27 PM   Modules accepted: Orders

## 2024-12-19 NOTE — Telephone Encounter (Signed)
 Copied from CRM #[id number]. Topic: Clinical - Prescription Issue >> Dec 18, 2024  1:34 PM Thersia C wrote: Reason for RMF:Ejupzwu called in regarding needing her dosage of  .75 Zepbound  sent to the pharmacy - patient states she needs it before 12/24 [id number] [place], [place] - [address] [place] [address] Phone: [phone] Fax: [phone] >> Dec 19, 2024  9:58 AM Terri MATSU wrote: Patient is calling again for her .75MG  Zepbound  sent to the pharmacy , she wants to make sure she has it before christmas. Advised her on the 3business days turnaround time.

## 2024-12-19 NOTE — Telephone Encounter (Signed)
 Copied from CRM #[id number]. Topic: Clinical - Prescription Issue >> Dec 18, 2024  1:34 PM Thersia C wrote: Reason for RMF:Ejupzwu called in regarding needing her dosage of  .75 Zepbound  sent to the pharmacy - patient states she needs it before 12/24 [id number] - [place], [place] - [address] [place] [address] Phone: [phone] Fax: [phone]

## 2024-12-20 NOTE — Telephone Encounter (Signed)
 Spoke with patient. She is aware of script sent into pharmacy. No questions or concerns.

## 2024-12-28 ENCOUNTER — Other Ambulatory Visit: Payer: Self-pay | Admitting: Nurse Practitioner

## 2024-12-28 DIAGNOSIS — F341 Dysthymic disorder: Secondary | ICD-10-CM

## 2024-12-31 ENCOUNTER — Other Ambulatory Visit: Payer: Self-pay | Admitting: Nurse Practitioner

## 2024-12-31 DIAGNOSIS — M797 Fibromyalgia: Secondary | ICD-10-CM

## 2025-01-11 ENCOUNTER — Encounter: Payer: Self-pay | Admitting: Nurse Practitioner

## 2025-01-11 ENCOUNTER — Ambulatory Visit: Admitting: Nurse Practitioner

## 2025-01-11 DIAGNOSIS — G4733 Obstructive sleep apnea (adult) (pediatric): Secondary | ICD-10-CM | POA: Diagnosis not present

## 2025-01-11 MED ORDER — ZEPBOUND 10 MG/0.5ML ~~LOC~~ SOAJ: 10.0000 mg | SUBCUTANEOUS | 0 refills | Status: DC

## 2025-01-11 NOTE — Patient Instructions (Addendum)
Nice to see you today I want to see you in 3 months, sooner if you need me Keep up the good work 

## 2025-01-11 NOTE — Progress Notes (Signed)
 "  Established Patient Office Visit  Subjective   Patient ID: Joan Russo, female    DOB: [birth year]  Age: 69 y.o. MRN: [id number]  Chief Complaint  Patient presents with   Medical Management of Chronic Issues    3 month follow on weight and Zepbound     HPI  Discussed the use of AI scribe software for clinical note transcription with the patient, who gave verbal consent to proceed.  History of Present Illness Joan Russo is a 69 year old female with obesity and orthopedic issues who presents for follow-up on weight management and medication adjustment.  She is currently on Zepbound  7.5 mg, in her third week of this dosage. Initially, she experienced mild queasiness and fatigue the day after taking the medication, but these symptoms have subsided. She reports increased energy levels and a significant weight loss of 16 pounds over 11 weeks. She notes that her knees feel a little better.  She experiences constipation, managed with Metamucil and increased water intake, resulting in bowel movements approximately every other day. She is not a regular water drinker but is making an effort to consume more fluids.  Her appetite has significantly decreased since starting Zepbound , leading her to plan meals in advance. She enjoys salmon and baked vegetables and consumes two meals a day, supplementing her diet with Premier Protein drinks, providing 30 grams of protein and 160 calories each. She has also started taking a vitamin that begins with 'C' to improve her fingernails and hair.  She reports sleep disturbances, waking every two hours despite using trazodone , which allows her to sleep for only two hours at a time. She sometimes naps in the afternoon and uses a CPAP mask at night.  She has been diagnosed with the early stages of dry macular degeneration and is using prescribed eye drops and AREDS vitamins to manage the condition.     Review of Systems  Constitutional:  Negative  for chills and fever.  Respiratory:  Negative for shortness of breath.   Cardiovascular:  Negative for chest pain.  Gastrointestinal:  Positive for constipation.  Neurological:  Negative for headaches.      Objective:     BP 132/80 (BP Location: Left Arm, Patient Position: Sitting, Cuff Size: Large)   Pulse 80   Temp 98 F (36.7 C) (Temporal)   Ht 4' 10 (1.473 m)   Wt 218 lb (98.9 kg)   SpO2 98%   BMI 45.56 kg/m  BP Readings from Last 3 Encounters:  01/11/25 132/80  10/11/24 112/68  10/10/24 (!) 140/70   Wt Readings from Last 3 Encounters:  01/11/25 218 lb (98.9 kg)  10/11/24 232 lb 9.6 oz (105.5 kg)  10/10/24 234 lb 9.6 oz (106.4 kg)   SpO2 Readings from Last 3 Encounters:  01/11/25 98%  10/11/24 95%  10/10/24 100%      Physical Exam Vitals and nursing note reviewed.  Constitutional:      Appearance: Normal appearance.  Cardiovascular:     Rate and Rhythm: Normal rate and regular rhythm.     Heart sounds: Normal heart sounds.  Pulmonary:     Effort: Pulmonary effort is normal.     Breath sounds: Normal breath sounds.  Abdominal:     General: Bowel sounds are normal.  Neurological:     Mental Status: She is alert.      No results found for any visits on 01/11/25.    The 10-year ASCVD risk score (Arnett DK, et al., 2019)  is: 10%    Assessment & Plan:   Problem List Items Addressed This Visit       Respiratory   OSA on CPAP   Relevant Medications   tirzepatide  (ZEPBOUND ) 10 MG/0.5ML Pen   Other Visit Diagnoses       Morbid obesity (HCC)    -  Primary   Relevant Medications   tirzepatide  (ZEPBOUND ) 10 MG/0.5ML Pen     Assessment and Plan Assessment & Plan Obesity On Zepbound  7.5 mg with 16-pound weight loss over 11 weeks. Initial side effects resolved. Appetite decreased, dietary changes made. BMI 45, above knee replacement threshold. - Continue Zepbound  7.5 mg, escalate dose as tolerated. - Encouraged increased protein and vegetable  intake. - Advised on hydration and fiber intake to manage constipation.  Constipation Managed with Metamucil and increased water intake. Bowel movements every other day. Risk increases with higher Zepbound  doses. - Increase water intake to 64 ounces daily. - Continue Metamucil or other fiber supplements. - Consider Miralax daily if constipation persists.  Insomnia Wakes every two hours despite trazodone . Afternoon sleepiness managed with naps. Sleep disturbances may be conditioned by past habits. - Allow naps if needed, with awareness of potential impact on nighttime sleep.  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 Risk of central vision loss if progresses. - Continue AREDS vitamins and eye drops as prescribed. - Continue regular follow-up with ophthalmologist.   Return in about 3 months (around 04/11/2025) for CPE and Labs.    Adina Crandall, NP  "

## 2025-02-01 ENCOUNTER — Encounter: Payer: Self-pay | Admitting: Nurse Practitioner

## 2025-02-01 MED ORDER — ZEPBOUND 12.5 MG/0.5ML ~~LOC~~ SOAJ: 12.5000 mg | SUBCUTANEOUS | 0 refills | Status: AC

## 2025-04-12 ENCOUNTER — Encounter: Admitting: Nurse Practitioner

## 2025-06-12 ENCOUNTER — Ambulatory Visit
